# Patient Record
Sex: Male | Born: 1950 | Race: White | Hispanic: No | Marital: Single | Smoking: Never smoker
Health system: Southern US, Community
[De-identification: ages and names within clinical notes are randomized; demographics above are authoritative.]

## PROBLEM LIST (undated history)

## (undated) DIAGNOSIS — G459 Transient cerebral ischemic attack, unspecified: Secondary | ICD-10-CM

## (undated) DIAGNOSIS — E039 Hypothyroidism, unspecified: Secondary | ICD-10-CM

## (undated) DIAGNOSIS — E785 Hyperlipidemia, unspecified: Secondary | ICD-10-CM

## (undated) DIAGNOSIS — Z8709 Personal history of other diseases of the respiratory system: Secondary | ICD-10-CM

## (undated) DIAGNOSIS — K219 Gastro-esophageal reflux disease without esophagitis: Secondary | ICD-10-CM

## (undated) DIAGNOSIS — C61 Malignant neoplasm of prostate: Secondary | ICD-10-CM

## (undated) DIAGNOSIS — I739 Peripheral vascular disease, unspecified: Secondary | ICD-10-CM

## (undated) DIAGNOSIS — M549 Dorsalgia, unspecified: Secondary | ICD-10-CM

## (undated) DIAGNOSIS — F32A Depression, unspecified: Secondary | ICD-10-CM

## (undated) DIAGNOSIS — R351 Nocturia: Secondary | ICD-10-CM

## (undated) DIAGNOSIS — T7840XA Allergy, unspecified, initial encounter: Secondary | ICD-10-CM

## (undated) DIAGNOSIS — F329 Major depressive disorder, single episode, unspecified: Secondary | ICD-10-CM

## (undated) DIAGNOSIS — I10 Essential (primary) hypertension: Secondary | ICD-10-CM

## (undated) DIAGNOSIS — I639 Cerebral infarction, unspecified: Secondary | ICD-10-CM

## (undated) DIAGNOSIS — G4700 Insomnia, unspecified: Secondary | ICD-10-CM

## (undated) DIAGNOSIS — Z860100 Personal history of colon polyps, unspecified: Secondary | ICD-10-CM

## (undated) DIAGNOSIS — Z87442 Personal history of urinary calculi: Secondary | ICD-10-CM

## (undated) DIAGNOSIS — M1990 Unspecified osteoarthritis, unspecified site: Secondary | ICD-10-CM

## (undated) DIAGNOSIS — M2550 Pain in unspecified joint: Secondary | ICD-10-CM

## (undated) DIAGNOSIS — G8929 Other chronic pain: Secondary | ICD-10-CM

## (undated) DIAGNOSIS — J189 Pneumonia, unspecified organism: Secondary | ICD-10-CM

## (undated) HISTORY — DX: Malignant neoplasm of prostate: C61

## (undated) HISTORY — DX: Major depressive disorder, single episode, unspecified: F32.9

## (undated) HISTORY — DX: Hypothyroidism, unspecified: E03.9

## (undated) HISTORY — DX: Personal history of colonic polyps: Z86.010

## (undated) HISTORY — DX: Essential (primary) hypertension: I10

## (undated) HISTORY — DX: Depression, unspecified: F32.A

## (undated) HISTORY — PX: POLYPECTOMY: SHX149

## (undated) HISTORY — PX: TIBIA FRACTURE SURGERY: SHX806

## (undated) HISTORY — PX: HERNIA REPAIR: SHX51

## (undated) HISTORY — DX: Cerebral infarction, unspecified: I63.9

## (undated) HISTORY — DX: Insomnia, unspecified: G47.00

## (undated) HISTORY — PX: KNEE SURGERY: SHX244

## (undated) HISTORY — PX: ELBOW SURGERY: SHX618

## (undated) HISTORY — PX: PROSTATECTOMY: SHX69

## (undated) HISTORY — PX: COLONOSCOPY: SHX174

## (undated) HISTORY — PX: TONSILLECTOMY: SUR1361

## (undated) HISTORY — DX: Personal history of colon polyps, unspecified: Z86.0100

## (undated) HISTORY — DX: Unspecified osteoarthritis, unspecified site: M19.90

## (undated) HISTORY — DX: Allergy, unspecified, initial encounter: T78.40XA

---

## 1997-07-16 ENCOUNTER — Ambulatory Visit (HOSPITAL_COMMUNITY): Admission: RE | Admit: 1997-07-16 | Discharge: 1997-07-16 | Payer: Self-pay | Admitting: Internal Medicine

## 2003-05-01 ENCOUNTER — Encounter: Admission: RE | Admit: 2003-05-01 | Discharge: 2003-05-01 | Payer: Self-pay | Admitting: Psychiatry

## 2004-01-26 ENCOUNTER — Ambulatory Visit: Payer: Self-pay | Admitting: Internal Medicine

## 2004-03-17 ENCOUNTER — Ambulatory Visit: Payer: Self-pay | Admitting: Internal Medicine

## 2004-05-11 ENCOUNTER — Ambulatory Visit: Payer: Self-pay | Admitting: Internal Medicine

## 2004-08-02 ENCOUNTER — Ambulatory Visit: Payer: Self-pay | Admitting: Internal Medicine

## 2004-09-12 ENCOUNTER — Ambulatory Visit: Payer: Self-pay | Admitting: Internal Medicine

## 2004-10-05 ENCOUNTER — Ambulatory Visit: Payer: Self-pay | Admitting: Family Medicine

## 2004-11-22 ENCOUNTER — Ambulatory Visit: Payer: Self-pay | Admitting: Internal Medicine

## 2004-12-13 ENCOUNTER — Ambulatory Visit: Payer: Self-pay | Admitting: Internal Medicine

## 2004-12-19 ENCOUNTER — Ambulatory Visit: Payer: Self-pay | Admitting: Internal Medicine

## 2005-02-17 ENCOUNTER — Emergency Department (HOSPITAL_COMMUNITY): Admission: EM | Admit: 2005-02-17 | Discharge: 2005-02-17 | Payer: Self-pay | Admitting: Emergency Medicine

## 2005-03-02 ENCOUNTER — Ambulatory Visit: Payer: Self-pay | Admitting: Internal Medicine

## 2005-03-08 ENCOUNTER — Encounter: Payer: Self-pay | Admitting: Internal Medicine

## 2005-03-08 ENCOUNTER — Ambulatory Visit: Payer: Self-pay

## 2005-03-21 ENCOUNTER — Ambulatory Visit: Payer: Self-pay | Admitting: Internal Medicine

## 2005-04-04 ENCOUNTER — Ambulatory Visit: Payer: Self-pay | Admitting: Internal Medicine

## 2005-06-22 ENCOUNTER — Ambulatory Visit: Payer: Self-pay | Admitting: Internal Medicine

## 2005-07-13 ENCOUNTER — Ambulatory Visit: Payer: Self-pay | Admitting: Internal Medicine

## 2005-09-29 ENCOUNTER — Ambulatory Visit: Payer: Self-pay | Admitting: Internal Medicine

## 2006-07-03 ENCOUNTER — Ambulatory Visit: Payer: Self-pay | Admitting: Internal Medicine

## 2006-07-03 LAB — CONVERTED CEMR LAB
AST: 22 units/L (ref 0–37)
Albumin: 4.2 g/dL (ref 3.5–5.2)
Basophils Absolute: 0 10*3/uL (ref 0.0–0.1)
Bilirubin, Direct: 0.1 mg/dL (ref 0.0–0.3)
Calcium: 9.4 mg/dL (ref 8.4–10.5)
Chloride: 108 meq/L (ref 96–112)
Cholesterol: 212 mg/dL (ref 0–200)
Eosinophils Absolute: 0.2 10*3/uL (ref 0.0–0.6)
Eosinophils Relative: 3.3 % (ref 0.0–5.0)
GFR calc Af Amer: 99 mL/min
GFR calc non Af Amer: 82 mL/min
Glucose, Bld: 98 mg/dL (ref 70–99)
HDL: 34.4 mg/dL — ABNORMAL LOW (ref >39.0)
Lymphocytes Relative: 23.1 % (ref 12.0–46.0)
MCHC: 34.8 g/dL (ref 30.0–36.0)
MCV: 90.1 fL (ref 78.0–100.0)
Neutro Abs: 4.3 10*3/uL (ref 1.4–7.7)
Neutrophils Relative %: 62.5 % (ref 43.0–77.0)
PSA: 0.01 ng/mL — ABNORMAL LOW (ref 0.10–4.00)
Platelets: 262 10*3/uL (ref 150–400)
RBC: 4.97 M/uL (ref 4.22–5.81)
Sodium: 143 meq/L (ref 135–145)
TSH: 6.26 microintl units/mL — ABNORMAL HIGH (ref 0.35–5.50)
Triglycerides: 165 mg/dL — ABNORMAL HIGH (ref 0–149)
WBC: 6.9 10*3/uL (ref 4.5–10.5)

## 2006-07-10 ENCOUNTER — Ambulatory Visit: Payer: Self-pay | Admitting: Internal Medicine

## 2006-07-22 ENCOUNTER — Emergency Department (HOSPITAL_COMMUNITY): Admission: EM | Admit: 2006-07-22 | Discharge: 2006-07-22 | Payer: Self-pay | Admitting: Emergency Medicine

## 2006-08-01 ENCOUNTER — Ambulatory Visit: Payer: Self-pay | Admitting: Internal Medicine

## 2006-10-11 ENCOUNTER — Encounter: Payer: Self-pay | Admitting: Internal Medicine

## 2006-10-11 DIAGNOSIS — M1990 Unspecified osteoarthritis, unspecified site: Secondary | ICD-10-CM | POA: Insufficient documentation

## 2006-10-11 DIAGNOSIS — F3289 Other specified depressive episodes: Secondary | ICD-10-CM | POA: Insufficient documentation

## 2006-10-11 DIAGNOSIS — J309 Allergic rhinitis, unspecified: Secondary | ICD-10-CM | POA: Insufficient documentation

## 2006-10-11 DIAGNOSIS — Z8546 Personal history of malignant neoplasm of prostate: Secondary | ICD-10-CM

## 2006-10-11 DIAGNOSIS — F329 Major depressive disorder, single episode, unspecified: Secondary | ICD-10-CM

## 2006-10-15 ENCOUNTER — Ambulatory Visit: Payer: Self-pay | Admitting: Internal Medicine

## 2006-10-15 DIAGNOSIS — I10 Essential (primary) hypertension: Secondary | ICD-10-CM

## 2007-01-28 ENCOUNTER — Encounter: Payer: Self-pay | Admitting: Internal Medicine

## 2007-04-30 ENCOUNTER — Ambulatory Visit: Payer: Self-pay | Admitting: Internal Medicine

## 2007-06-27 ENCOUNTER — Telehealth: Payer: Self-pay | Admitting: Internal Medicine

## 2007-07-12 ENCOUNTER — Ambulatory Visit: Payer: Self-pay | Admitting: Internal Medicine

## 2007-07-12 LAB — CONVERTED CEMR LAB
ALT: 27 units/L (ref 0–53)
Basophils Absolute: 0.1 10*3/uL (ref 0.0–0.1)
Basophils Relative: 0.6 % (ref 0.0–1.0)
Bilirubin Urine: NEGATIVE
CO2: 30 meq/L (ref 19–32)
Calcium: 9.4 mg/dL (ref 8.4–10.5)
Cholesterol: 209 mg/dL (ref 0–200)
Creatinine, Ser: 1.1 mg/dL (ref 0.4–1.5)
Direct LDL: 145.7 mg/dL
Eosinophils Absolute: 0.1 10*3/uL (ref 0.0–0.7)
GFR calc Af Amer: 88 mL/min
Glucose, Urine, Semiquant: NEGATIVE
HCT: 40.7 % (ref 39.0–52.0)
Hemoglobin: 14.1 g/dL (ref 13.0–17.0)
Lymphocytes Relative: 20.7 % (ref 12.0–46.0)
MCHC: 34.7 g/dL (ref 30.0–36.0)
MCV: 89.7 fL (ref 78.0–100.0)
Monocytes Absolute: 0.7 10*3/uL (ref 0.1–1.0)
Neutro Abs: 5.8 10*3/uL (ref 1.4–7.7)
PSA: 0 ng/mL — ABNORMAL LOW (ref 0.10–4.00)
RBC: 4.53 M/uL (ref 4.22–5.81)
RDW: 14.6 % (ref 11.5–14.6)
Specific Gravity, Urine: 1.025
TSH: 4.75 microintl units/mL (ref 0.35–5.50)
Total Bilirubin: 1 mg/dL (ref 0.3–1.2)
WBC Urine, dipstick: NEGATIVE
pH: 7

## 2007-07-22 ENCOUNTER — Ambulatory Visit: Payer: Self-pay | Admitting: Internal Medicine

## 2007-08-21 ENCOUNTER — Ambulatory Visit: Payer: Self-pay | Admitting: Internal Medicine

## 2007-08-21 DIAGNOSIS — J069 Acute upper respiratory infection, unspecified: Secondary | ICD-10-CM | POA: Insufficient documentation

## 2008-01-22 ENCOUNTER — Ambulatory Visit: Payer: Self-pay | Admitting: Internal Medicine

## 2008-05-04 ENCOUNTER — Telehealth: Payer: Self-pay | Admitting: Internal Medicine

## 2008-07-17 ENCOUNTER — Ambulatory Visit: Payer: Self-pay | Admitting: Internal Medicine

## 2008-07-17 LAB — CONVERTED CEMR LAB
ALT: 25 units/L (ref 0–53)
Basophils Relative: 0.4 % (ref 0.0–3.0)
Bilirubin Urine: NEGATIVE
Bilirubin, Direct: 0 mg/dL (ref 0.0–0.3)
Chloride: 110 meq/L (ref 96–112)
Eosinophils Relative: 2.8 % (ref 0.0–5.0)
HCT: 40.2 % (ref 39.0–52.0)
Hemoglobin: 13.7 g/dL (ref 13.0–17.0)
LDL Cholesterol: 125 mg/dL — ABNORMAL HIGH (ref 0–99)
Lymphs Abs: 1.2 10*3/uL (ref 0.7–4.0)
MCV: 89.2 fL (ref 78.0–100.0)
Monocytes Absolute: 0.5 10*3/uL (ref 0.1–1.0)
Nitrite: NEGATIVE
PSA: 0.01 ng/mL — ABNORMAL LOW (ref 0.10–4.00)
Potassium: 3.8 meq/L (ref 3.5–5.1)
RBC: 4.51 M/uL (ref 4.22–5.81)
TSH: 3.56 microintl units/mL (ref 0.35–5.50)
Total CHOL/HDL Ratio: 6
Total Protein: 6.9 g/dL (ref 6.0–8.3)
Urobilinogen, UA: 0.2
WBC: 4.9 10*3/uL (ref 4.5–10.5)

## 2008-07-28 ENCOUNTER — Ambulatory Visit: Payer: Self-pay | Admitting: Internal Medicine

## 2008-08-17 ENCOUNTER — Ambulatory Visit: Payer: Self-pay | Admitting: Gastroenterology

## 2008-08-31 ENCOUNTER — Encounter: Payer: Self-pay | Admitting: Gastroenterology

## 2008-08-31 ENCOUNTER — Ambulatory Visit: Payer: Self-pay | Admitting: Gastroenterology

## 2008-08-31 LAB — HM COLONOSCOPY

## 2008-09-01 ENCOUNTER — Encounter: Payer: Self-pay | Admitting: Gastroenterology

## 2009-07-30 ENCOUNTER — Ambulatory Visit: Payer: Self-pay | Admitting: Internal Medicine

## 2009-07-30 LAB — CONVERTED CEMR LAB
AST: 25 units/L (ref 0–37)
Albumin: 4.3 g/dL (ref 3.5–5.2)
Alkaline Phosphatase: 63 units/L (ref 39–117)
Basophils Relative: 0.7 % (ref 0.0–3.0)
Bilirubin, Direct: 0.2 mg/dL (ref 0.0–0.3)
CO2: 26 meq/L (ref 19–32)
Calcium: 9.1 mg/dL (ref 8.4–10.5)
Eosinophils Relative: 2.5 % (ref 0.0–5.0)
GFR calc non Af Amer: 77.29 mL/min (ref >60)
HDL: 36.4 mg/dL — ABNORMAL LOW (ref >39.00)
Hemoglobin: 14.3 g/dL (ref 13.0–17.0)
LDL Cholesterol: 115 mg/dL — ABNORMAL HIGH (ref 0–99)
Lymphocytes Relative: 19.3 % (ref 12.0–46.0)
MCHC: 34.7 g/dL (ref 30.0–36.0)
Monocytes Relative: 8.2 % (ref 3.0–12.0)
Neutro Abs: 4.9 10*3/uL (ref 1.4–7.7)
Nitrite: NEGATIVE
RBC: 4.4 M/uL (ref 4.22–5.81)
Sodium: 144 meq/L (ref 135–145)
Specific Gravity, Urine: 1.025
Total CHOL/HDL Ratio: 5
Total Protein: 6.6 g/dL (ref 6.0–8.3)
WBC Urine, dipstick: NEGATIVE
WBC: 7.1 10*3/uL (ref 4.5–10.5)

## 2009-08-06 ENCOUNTER — Ambulatory Visit: Payer: Self-pay | Admitting: Internal Medicine

## 2009-08-06 DIAGNOSIS — E039 Hypothyroidism, unspecified: Secondary | ICD-10-CM | POA: Insufficient documentation

## 2009-08-06 DIAGNOSIS — Z860100 Personal history of colon polyps, unspecified: Secondary | ICD-10-CM | POA: Insufficient documentation

## 2009-08-06 DIAGNOSIS — F82 Specific developmental disorder of motor function: Secondary | ICD-10-CM | POA: Insufficient documentation

## 2009-08-10 ENCOUNTER — Telehealth: Payer: Self-pay | Admitting: Internal Medicine

## 2009-08-17 ENCOUNTER — Encounter: Admission: RE | Admit: 2009-08-17 | Discharge: 2009-08-17 | Payer: Self-pay | Admitting: Internal Medicine

## 2009-08-18 ENCOUNTER — Telehealth: Payer: Self-pay | Admitting: Internal Medicine

## 2009-09-03 ENCOUNTER — Ambulatory Visit: Payer: Self-pay | Admitting: Internal Medicine

## 2009-09-23 ENCOUNTER — Telehealth: Payer: Self-pay | Admitting: Internal Medicine

## 2009-10-05 ENCOUNTER — Ambulatory Visit: Payer: Self-pay | Admitting: Internal Medicine

## 2010-03-06 DIAGNOSIS — J189 Pneumonia, unspecified organism: Secondary | ICD-10-CM

## 2010-03-06 HISTORY — DX: Pneumonia, unspecified organism: J18.9

## 2010-04-05 ENCOUNTER — Ambulatory Visit
Admission: RE | Admit: 2010-04-05 | Discharge: 2010-04-05 | Payer: Self-pay | Source: Home / Self Care | Attending: Internal Medicine | Admitting: Internal Medicine

## 2010-04-05 NOTE — Assessment & Plan Note (Signed)
Summary: 1 month rov/njr   Vital Signs:  Patient profile:   60 year old male Weight:      235 pounds Temp:     98.6 degrees F oral BP sitting:   126 / 78  (right arm) Cuff size:   regular  Vitals Entered By: Duard Brady LPN (September 03, 1608 9:12 AM) CC: 1 mos rov - doing well Is Patient Diabetic? No   CC:  1 mos rov - doing well.  History of Present Illness: 60 -year-old patient who is seen today for follow-up of his hypertension.  He was resumed on combination therapy one month ago.  At that time, he presented with a history of some right hand dyspraxia, which has improved.  An MRI was consistent with a left caudate lacunar stroke.  He has been compliant with his medications.  Preventive Screening-Counseling & Management  Alcohol-Tobacco     Smoking Status: quit  Allergies (verified): No Known Drug Allergies  Past History:  Past Medical History: Allergic rhinitis Prostate cancer, hx of Depression Osteoarthritis Insomnia ED history of hypertension Colonic polyps, hx of Hypothyroidism left caudate lacunar stroke March 2011  Past Surgical History: Reviewed history from 08/06/2009 and no changes required. Prostatectomy 1998 Tonsillectomy Knee surgery L Elbow surgery Inguinal herniorrhaphy fracture right lower leg  colonoscopy or sigmoidoscopy in 1998 colonoscopy june 2010  Social History: Smoking Status:  quit  Review of Systems       The patient complains of difficulty walking.  The patient denies anorexia, fever, weight loss, weight gain, vision loss, decreased hearing, hoarseness, chest pain, syncope, dyspnea on exertion, peripheral edema, prolonged cough, headaches, hemoptysis, abdominal pain, melena, hematochezia, severe indigestion/heartburn, hematuria, incontinence, genital sores, muscle weakness, suspicious skin lesions, transient blindness, depression, unusual weight change, abnormal bleeding, enlarged lymph nodes, angioedema, breast masses, and  testicular masses.    Physical Exam  General:  Well-developed,well-nourished,in no acute distress; alert,appropriate and cooperative throughout examination Head:  Normocephalic and atraumatic without obvious abnormalities. No apparent alopecia or balding. Mouth:  Oral mucosa and oropharynx without lesions or exudates.  Teeth in good repair. Neck:  No deformities, masses, or tenderness noted. Lungs:  Normal respiratory effort, chest expands symmetrically. Lungs are clear to auscultation, no crackles or wheezes. Heart:  Normal rate and regular rhythm. S1 and S2 normal without gallop, murmur, click, rub or other extra sounds. Abdomen:  Bowel sounds positive,abdomen soft and non-tender without masses, organomegaly or hernias noted.   Impression & Recommendations:  Problem # 1:  DYSPRAXIA (ICD-315.4)  Problem # 2:  HYPOTHYROIDISM (ICD-244.9)  His updated medication list for this problem includes:    Levothroid 50 Mcg Tabs (Levothyroxine sodium) ..... One daily  His updated medication list for this problem includes:    Levothroid 50 Mcg Tabs (Levothyroxine sodium) ..... One daily  Problem # 3:  HYPERTENSION, BENIGN ESSENTIAL (ICD-401.1)  His updated medication list for this problem includes:    Lisinopril-hydrochlorothiazide 20-12.5 Mg Tabs (Lisinopril-hydrochlorothiazide) ..... One daily  His updated medication list for this problem includes:    Lisinopril-hydrochlorothiazide 20-12.5 Mg Tabs (Lisinopril-hydrochlorothiazide) ..... One daily  Complete Medication List: 1)  Tramadol Hcl 50 Mg Tabs (Tramadol hcl) .Marland Kitchen.. 1 q6h as needed 2)  Piroxicam 20 Mg Caps (Piroxicam) .... As needed 3)  Lisinopril-hydrochlorothiazide 20-12.5 Mg Tabs (Lisinopril-hydrochlorothiazide) .... One daily 4)  Nasonex 50 Mcg/act Susp (Mometasone furoate) .... Used daily 5)  Aspir-trin 325 Mg Tbec (Aspirin) .... Take one daily 6)  Levothroid 50 Mcg Tabs (Levothyroxine sodium) .... One  daily 7)  Lipitor 20 Mg  Tabs (Atorvastatin calcium) .... One by mouth daily.  Patient Instructions: 1)  Please schedule a follow-up appointment in 4 months. 2)  Limit your Sodium (Salt). 3)  It is important that you exercise regularly at least 20 minutes 5 times a week. If you develop chest pain, have severe difficulty breathing, or feel very tired , stop exercising immediately and seek medical attention. 4)  Check your Blood Pressure regularly. If it is above: 150/90 you should make an appointment.

## 2010-04-05 NOTE — Assessment & Plan Note (Signed)
Summary: fu on med/njr/pt rescd//ccm   Vital Signs:  Patient profile:   60 year old male Weight:      231 pounds Temp:     98.7 degrees F oral BP sitting:   100 / 70  (right arm) Cuff size:   regular  Vitals Entered By: Duard Brady LPN (October 05, 2009 9:53 AM) CC: f/u on meds Is Patient Diabetic? No   CC:  f/u on meds.  History of Present Illness:  60 year old patient who is seen today for follow up.  he has a long history of depression and self discontinued Paxil  earlier in the spring.  More recently, he has had worsening depression and feels that he needs to resume this medication.  No real stressors or aggravating factors.  He does have treated hypertension, which has been stable.  No suicidal ideation  Allergies (verified): No Known Drug Allergies  Past History:  Past Medical History: Reviewed history from 09/03/2009 and no changes required. Allergic rhinitis Prostate cancer, hx of Depression Osteoarthritis Insomnia ED history of hypertension Colonic polyps, hx of Hypothyroidism left caudate lacunar stroke March 2011  Review of Systems       The patient complains of difficulty walking and depression.  The patient denies anorexia, fever, weight loss, weight gain, vision loss, decreased hearing, hoarseness, chest pain, syncope, dyspnea on exertion, peripheral edema, prolonged cough, headaches, hemoptysis, abdominal pain, melena, hematochezia, severe indigestion/heartburn, hematuria, incontinence, genital sores, muscle weakness, suspicious skin lesions, transient blindness, unusual weight change, abnormal bleeding, enlarged lymph nodes, angioedema, breast masses, and testicular masses.    Physical Exam  General:  overweight-appearing.  blood pressure low normal rangeoverweight-appearing.   Psych:  dysphoric affect.  dysphoric affect.     Impression & Recommendations:  Problem # 1:  DEPRESSION (ICD-311)  His updated medication list for this problem  includes:    Paroxetine Hcl 20 Mg Tabs (Paroxetine hcl) ..... One every aam  His updated medication list for this problem includes:    Paroxetine Hcl 20 Mg Tabs (Paroxetine hcl) ..... One every aam  Problem # 2:  HYPERTENSION, BENIGN ESSENTIAL (ICD-401.1)  His updated medication list for this problem includes:    Lisinopril-hydrochlorothiazide 20-12.5 Mg Tabs (Lisinopril-hydrochlorothiazide) ..... One daily  His updated medication list for this problem includes:    Lisinopril-hydrochlorothiazide 20-12.5 Mg Tabs (Lisinopril-hydrochlorothiazide) ..... One daily  Complete Medication List: 1)  Tramadol Hcl 50 Mg Tabs (Tramadol hcl) .Marland Kitchen.. 1 q6h as needed 2)  Piroxicam 20 Mg Caps (Piroxicam) .... As needed 3)  Lisinopril-hydrochlorothiazide 20-12.5 Mg Tabs (Lisinopril-hydrochlorothiazide) .... One daily 4)  Nasonex 50 Mcg/act Susp (Mometasone furoate) .... Used daily 5)  Aspir-trin 325 Mg Tbec (Aspirin) .... Take one daily 6)  Levothroid 50 Mcg Tabs (Levothyroxine sodium) .... One daily 7)  Lipitor 20 Mg Tabs (Atorvastatin calcium) .... One by mouth daily. 8)  Paroxetine Hcl 20 Mg Tabs (Paroxetine hcl) .... One every aam  Patient Instructions: 1)  Please schedule a follow-up appointment in 6 months. 2)  Limit your Sodium (Salt) to less than 2 grams a day(slightly less than 1/2 a teaspoon) to prevent fluid retention, swelling, or worsening of symptoms. 3)  It is important that you exercise regularly at least 20 minutes 5 times a week. If you develop chest pain, have severe difficulty breathing, or feel very tired , stop exercising immediately and seek medical attention. 4)  You need to lose weight. Consider a lower calorie diet and regular exercise.  Prescriptions: PAROXETINE HCL 20 MG  TABS (PAROXETINE HCL) one every aam  #90 x 4   Entered and Authorized by:   Gordy Savers  MD   Signed by:   Gordy Savers  MD on 10/05/2009   Method used:   Print then Give to Patient   RxID:    [id number] PAROXETINE HCL 20 MG TABS (PAROXETINE HCL) one every aam  #90 x 4   Entered and Authorized by:   Gordy Savers  MD   Signed by:   Gordy Savers  MD on 10/05/2009   Method used:   Electronically to        [facility]* (retail)       [address] [place], [place]  [address]       Ph: [phone] or [phone]       Fax: [phone]   RxID:   [id number]

## 2010-04-05 NOTE — Progress Notes (Signed)
Summary: MRI  Phone Note Call from Patient Call back at Home Phone [phone]   Summary of Call: MRI showed tiny stroke.  Noticed disconnect between brain & hand when writing & leg right sometimes doesn't respond.  Takes one 325mg  asa daily.  Does he need to do anything else or come & see you?  Initial call taken by: Rudy Jew, RN,  August 18, 2009 3:47 PM  Follow-up for Phone Call        start   Lipitor, 20 mg daily; needs office visit in 3 months for follow-up.  Laboratory studies.  ask  patient to make office visit if there is any change in his status Follow-up by: Gordy Savers  MD,  August 19, 2009 8:00 AM  Additional Follow-up for Phone Call Additional follow up Details #1::        LMTCB Additional Follow-up by: Lynann Beaver CMA,  August 19, 2009 8:56 AM    New/Updated Medications: LIPITOR 20 MG TABS (ATORVASTATIN CALCIUM) one by mouth daily. Prescriptions: LIPITOR 20 MG TABS (ATORVASTATIN CALCIUM) one by mouth daily.  #90 x 3   Entered by:   Lynann Beaver CMA   Authorized by:   Gordy Savers  MD   Signed by:   Lynann Beaver CMA on 08/19/2009   Method used:   Electronically to        [facility]* (retail)       [address] [place], [place]  [address]       Ph: [phone] or [phone]       Fax: [phone]   RxID:   [id number]  Pt notified.

## 2010-04-05 NOTE — Progress Notes (Signed)
Summary: valium for MRI  Phone Note Call from Patient Call back at Home Phone [phone]   Summary of Call: Needs valium for MRI Thurs.   [facility] suggested.  [facility]  NKDA. Initial call taken by: Rudy Jew, RN,  August 10, 2009 3:58 PM  Follow-up for Phone Call        diazepam 53m  #3 Follow-up by: Gordy Savers  MD,  August 10, 2009 5:13 PM  Additional Follow-up for Phone Call Additional follow up Details #1::        Called to [facility]. Additional Follow-up by: Lynann Beaver CMA,  August 11, 2009 10:35 AM

## 2010-04-05 NOTE — Progress Notes (Signed)
Summary: parxotine - denied  Phone Note Refill Request Message from:  Fax from Pharmacy on September 23, 2009 1:17 PM  requesting refill on paroxetine hcl 20mg  - [facility] [phone]   Method Requested: Fax to Local Pharmacy Initial call taken by: Duard Brady LPN,  September 23, 2009 1:18 PM  Follow-up for Phone Call        medication was removed from med list 08/06/09 ov - was seen 7/1 - was not placed back on med list - not in visit notes.  denied. faxed bck to pharm. KIK Follow-up by: Duard Brady LPN,  September 23, 2009 1:19 PM

## 2010-04-05 NOTE — Assessment & Plan Note (Signed)
Summary: cpx//ccm   Vital Signs:  Patient profile:   60 year old male Height:      69 inches Weight:      239 pounds BMI:     35.42 Temp:     98.5 degrees F oral BP sitting:   130 / 88  (left arm) Cuff size:   regular  Vitals Entered By: Duard Brady LPN (August 06, 452 2:57 PM) CC: cpx - doing well , labs done Is Patient Diabetic? No   CC:  cpx - doing well  and labs done.  History of Present Illness: 60 year old patient who is in today for an annual examination.  His long history of hypertension, but has been off his antihypertensive medications for approximately 1 month.  He has advanced arthritis.  History depression, which has been stable off his medication has remote history of prostate cancer, and allergic rhinitis. For the past, one or two months.  He has had some difficulty with the use of his right hand.  He states that he is had a difficult time writing  legibly.  He also describes some stiffness  and a  sense of his right leg being unsteady.  For the past 3 months.  He describes occasionally neck pain.  Denies any bowel or bladder difficulty  Preventive Screening-Counseling & Management  Alcohol-Tobacco     Smoking Status: never  Allergies (verified): No Known Drug Allergies  Past History:  Past Medical History: Allergic rhinitis Prostate cancer, hx of Depression Osteoarthritis Insomnia ED history of hypertension Colonic polyps, hx of Hypothyroidism  Past Surgical History: Prostatectomy 1998 Tonsillectomy Knee surgery L Elbow surgery Inguinal herniorrhaphy fracture right lower leg  colonoscopy or sigmoidoscopy in 1998 colonoscopy june 2010  Family History: Reviewed history from 07/28/2008 and no changes required. father died age 66, MI mother died at 50 from a motor vehicle accident had a history of asthma two brothers 3 sisters-  positive for coronary artery disease  (2)  One sister died of MI (77)  Social History: Reviewed history  from 04/30/2007 and no changes required. 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  The patient denies anorexia, fever, weight loss, weight gain, vision loss, decreased hearing, hoarseness, chest pain, syncope, dyspnea on exertion, peripheral edema, prolonged cough, headaches, hemoptysis, abdominal pain, melena, hematochezia, severe indigestion/heartburn, hematuria, incontinence, genital sores, muscle weakness, suspicious skin lesions, transient blindness, unusual weight change, abnormal bleeding, enlarged lymph nodes, angioedema, breast masses, and testicular masses.    Physical Exam  General:  overweight-appearing.  160/92overweight-appearing.   Head:  Normocephalic and atraumatic without obvious abnormalities. No apparent alopecia or balding. Eyes:  No corneal or conjunctival inflammation noted. EOMI. Perrla. Funduscopic exam benign, without hemorrhages, exudates or papilledema. Vision grossly normal. Ears:  External ear exam shows no significant lesions or deformities.  Otoscopic examination reveals clear canals, tympanic membranes are intact bilaterally without bulging, retraction, inflammation or discharge. Hearing is grossly normal bilaterally. Nose:  External nasal examination shows no deformity or inflammation. Nasal mucosa are pink and moist without lesions or exudates. Mouth:  Oral mucosa and oropharynx without lesions or exudates.  Teeth in good repair. Neck:  No deformities, masses, or tenderness noted. Chest Wall:  No deformities, masses, tenderness or gynecomastia noted. Breasts:  No masses or gynecomastia noted Lungs:  Normal respiratory effort, chest expands symmetrically. Lungs are clear to auscultation, no crackles or wheezes. Heart:  Normal rate and regular rhythm. S1 and S2 normal without gallop, murmur, click, rub or other extra sounds. Abdomen:  Bowel sounds positive,abdomen  soft and non-tender without masses, organomegaly or hernias noted. Rectal:  No external abnormalities noted. Normal sphincter tone. No rectal masses or tenderness. Genitalia:  Testes bilaterally descended without nodularity, tenderness or masses. No scrotal masses or lesions. No penis lesions or urethral discharge. Prostate:  prostate surgically absent Msk:  No deformity or scoliosis noted of thoracic or lumbar spine.   Pulses:  R and L carotid,radial,femoral,dorsalis pedis and posterior tibial pulses are full and equal bilaterally Extremities:  No clubbing, cyanosis, edema, or deformity noted with normal full range of motion of all joints.   Neurologic:  alert & oriented X3, cranial nerves II-XII intact, and strength normal in all extremities.   vibratory  sensation, and soft touch decreased in the extremities on the right side reflexes slightly hyporeflexive, but symmetrical finger-to-nose, and heel-to-shin testing were normal no drift  Skin:  Intact without suspicious lesions or rashes Cervical Nodes:  No lymphadenopathy noted Axillary Nodes:  No palpable lymphadenopathy Inguinal Nodes:  No significant adenopathy Psych:  Cognition and judgment appear intact. Alert and cooperative with normal attention span and concentration. No apparent delusions, illusions, hallucinations   Complete Medication List: 1)  Tramadol Hcl 50 Mg Tabs (Tramadol hcl) .Marland Kitchen.. 1 q6h as needed 2)  Piroxicam 20 Mg Caps (Piroxicam) .... As needed 3)  Lisinopril-hydrochlorothiazide 20-12.5 Mg Tabs (Lisinopril-hydrochlorothiazide) .... One daily 4)  Nasonex 50 Mcg/act Susp (Mometasone furoate) .... Used daily 5)  Aspir-trin 325 Mg Tbec (Aspirin) .... Take one daily 6)  Levothroid 50 Mcg Tabs (Levothyroxine sodium) .... One daily  Other Orders: EKG w/ Interpretation (93000) Radiology Referral (Radiology)  Patient Instructions: 1)  brain MRI as discussed 2)  Limit your Sodium (Salt) to less than 2 grams a  day(slightly less than 1/2 a teaspoon) to prevent fluid retention, swelling, or worsening of symptoms. 3)  It is important that you exercise regularly at least 20 minutes 5 times a week. If you develop chest pain, have severe difficulty breathing, or feel very tired , stop exercising immediately and seek medical attention. 4)  You need to lose weight. Consider a lower calorie diet and regular exercise.  5)  Please schedule a follow-up appointment in 1 month. On the way to ENT if her with a [place] he showsPrescriptions: LEVOTHROID 50 MCG TABS (LEVOTHYROXINE SODIUM) one daily  #90 x 6   Entered and Authorized by:   Gordy Savers  MD   Signed by:   Gordy Savers  MD on 08/06/2009   Method used:   Electronically to        [facility]* (retail)       [address] [place], [place]  [address]       Ph: [phone] or [phone]       Fax: [phone]   RxID:   [id number] NASONEX 50 MCG/ACT SUSP (MOMETASONE FUROATE) used daily  #3 x 6   Entered and Authorized by:   Gordy Savers  MD   Signed by:   Gordy Savers  MD on 08/06/2009   Method used:   Electronically to        [facility]* (retail)       [address] [place], [place]  [address]       Ph: [phone] or [phone]       Fax: [phone]   RxID:   [id number] LISINOPRIL-HYDROCHLOROTHIAZIDE 20-12.5 MG TABS (LISINOPRIL-HYDROCHLOROTHIAZIDE) one daily  #90 x 6   Entered and Authorized by:   Gordy Savers  MD   Signed by:   Gordy Savers  MD on 08/06/2009   Method used:   Electronically to        [facility]* (retail)       [address] [place], [place]  [address]       Ph: [phone] or [phone]       Fax: [phone]   RxID:   [id number] PIROXICAM 20 MG  CAPS (PIROXICAM) as needed  #90 x  6   Entered and Authorized by:   Gordy Savers  MD   Signed by:   Gordy Savers  MD on 08/06/2009   Method used:   Electronically to        [facility]* (retail)       [address] [place], [place]  [address]       Ph: [phone] or [phone]       Fax: [phone]   RxID:   [id number] TRAMADOL HCL 50 MG  TABS (TRAMADOL HCL) 1 q6h as needed  #90 x 6   Entered and Authorized by:   Gordy Savers  MD   Signed by:   Gordy Savers  MD on 08/06/2009   Method used:   Electronically to        [facility]* (retail)       [address] [place], [place]  [address]       Ph: [phone] or [phone]       Fax: [phone]   RxID:   [id number]   Appended Document: cpx//ccm Impression-  health maintenance exam      R sided dyspraxia  Plan-  schedule Brain MRI

## 2010-04-13 NOTE — Assessment & Plan Note (Signed)
Summary: 4 month follow up/cjr/pt rescd//ccm   Vital Signs:  Patient profile:   60 year old male Weight:      235 pounds Temp:     98.6 degrees F oral BP sitting:   110 / 82  Vitals Entered By: Duard Brady LPN (April 05, 2010 8:26 AM) CC: 4 mos rov - doing ok , c/o (R) hip pain Is Patient Diabetic? No   CC:  4 mos rov - doing ok  and c/o (R) hip pain.  History of Present Illness: 60 year old patient who has a history of osteoarthritis.  He is on maintenance of anti-inflammatory medication and also takes tramadol p.r.n. pain.  He has did hypertension.  For the past 5 days, he has had worsening right hip pain.  He has had a flare of pain in this area in the past.  His last cortisone injection was in excess of one year ago.  His blood pressure remained stable.  He does work as a Research scientist (medical).  Much time walking.  He is also having some left knee discomfort.  Preventive Screening-Counseling & Management  Alcohol-Tobacco     Smoking Status: quit     Year Quit: 20 yrs ago  Allergies (verified): No Known Drug Allergies  Past History:  Past Medical History: Reviewed history from 09/03/2009 and no changes required. Allergic rhinitis Prostate cancer, hx of Depression Osteoarthritis Insomnia ED history of hypertension Colonic polyps, hx of Hypothyroidism left caudate lacunar stroke March 2011  Past Surgical History: Reviewed history from 08/06/2009 and no changes required. Prostatectomy 1998 Tonsillectomy Knee surgery L Elbow surgery Inguinal herniorrhaphy fracture right lower leg  colonoscopy or sigmoidoscopy in 1998 colonoscopy june 2010  Review of Systems       The patient complains of difficulty walking.  The patient denies anorexia, fever, weight loss, weight gain, vision loss, decreased hearing, hoarseness, chest pain, syncope, dyspnea on exertion, peripheral edema, prolonged cough, headaches, hemoptysis, abdominal pain, melena, hematochezia,  severe indigestion/heartburn, hematuria, incontinence, genital sores, muscle weakness, suspicious skin lesions, transient blindness, depression, unusual weight change, abnormal bleeding, enlarged lymph nodes, angioedema, breast masses, and testicular masses.    Physical Exam  General:  Well-developed,well-nourished,in no acute distress; alert,appropriate and cooperative throughout examination Head:  Normocephalic and atraumatic without obvious abnormalities. No apparent alopecia or balding. Eyes:  No corneal or conjunctival inflammation noted. EOMI. Perrla. Funduscopic exam benign, without hemorrhages, exudates or papilledema. Vision grossly normal. Mouth:  Oral mucosa and oropharynx without lesions or exudates.  Teeth in good repair. Neck:  No deformities, masses, or tenderness noted. Lungs:  Normal respiratory effort, chest expands symmetrically. Lungs are clear to auscultation, no crackles or wheezes. Heart:  Normal rate and regular rhythm. S1 and S2 normal without gallop, murmur, click, rub or other extra sounds. Abdomen:  Bowel sounds positive,abdomen soft and non-tender without masses, organomegaly or hernias noted. Msk:  range of motion of the right hip was only minimally impaired, but at times, quite painful to manipulation.  There is mild point tenderness over the right lateral hip area   Pulses:  R and L carotid,radial,femoral,dorsalis pedis and posterior tibial pulses are full and equal bilaterally Extremities:  No clubbing, cyanosis, edema, or deformity noted with normal full range of motion of all joints.     Impression & Recommendations:  Problem # 1:  OSTEOARTHRITIS (ICD-715.90)  His updated medication list for this problem includes:    Tramadol Hcl 50 Mg Tabs (Tramadol hcl) .Marland Kitchen... 1 q6h as needed  Piroxicam 20 Mg Caps (Piroxicam) .Marland Kitchen... As needed    Aspir-trin 325 Mg Tbec (Aspirin) .Marland Kitchen... Take one daily    Advil 200 Mg Tabs (Ibuprofen) .Marland Kitchen... Three times a day prn I suspect  this may be more of a hip bursitis  His updated medication list for this problem includes:    Tramadol Hcl 50 Mg Tabs (Tramadol hcl) .Marland Kitchen... 1 q6h as needed    Piroxicam 20 Mg Caps (Piroxicam) .Marland Kitchen... As needed    Aspir-trin 325 Mg Tbec (Aspirin) .Marland Kitchen... Take one daily    Advil 200 Mg Tabs (Ibuprofen) .Marland Kitchen... Three times a day prn  Problem # 2:  HYPERTENSION, BENIGN ESSENTIAL (ICD-401.1)  His updated medication list for this problem includes:    Lisinopril-hydrochlorothiazide 20-12.5 Mg Tabs (Lisinopril-hydrochlorothiazide) ..... One daily  His updated medication list for this problem includes:    Lisinopril-hydrochlorothiazide 20-12.5 Mg Tabs (Lisinopril-hydrochlorothiazide) ..... One daily  Complete Medication List: 1)  Tramadol Hcl 50 Mg Tabs (Tramadol hcl) .Marland Kitchen.. 1 q6h as needed 2)  Piroxicam 20 Mg Caps (Piroxicam) .... As needed 3)  Lisinopril-hydrochlorothiazide 20-12.5 Mg Tabs (Lisinopril-hydrochlorothiazide) .... One daily 4)  Nasonex 50 Mcg/act Susp (Mometasone furoate) .... Used daily 5)  Aspir-trin 325 Mg Tbec (Aspirin) .... Take one daily 6)  Levothroid 50 Mcg Tabs (Levothyroxine sodium) .... One daily 7)  Lipitor 20 Mg Tabs (Atorvastatin calcium) .... One by mouth daily. 8)  Paroxetine Hcl 20 Mg Tabs (Paroxetine hcl) .... One every aam 9)  Advil 200 Mg Tabs (Ibuprofen) .... Three times a day prn  Patient Instructions: 1)  Please schedule a follow-up appointment in 6 months for annual exam 2)  Limit your Sodium (Salt). 3)  orthopedic follow-up as needed Prescriptions: PAROXETINE HCL 20 MG TABS (PAROXETINE HCL) one every aam  #90 x 4   Entered and Authorized by:   Gordy Savers  MD   Signed by:   Gordy Savers  MD on 04/05/2010   Method used:   Electronically to        [facility]* (retail)       [address] [place], [place]  [address]       Ph: [phone] or [phone]       Fax: [phone]    RxID:   [id number] LIPITOR 20 MG TABS (ATORVASTATIN CALCIUM) one by mouth daily.  #90 x 3   Entered and Authorized by:   Gordy Savers  MD   Signed by:   Gordy Savers  MD on 04/05/2010   Method used:   Electronically to        [facility]* (retail)       [address] [place], [place]  [address]       Ph: [phone] or [phone]       Fax: [phone]   RxID:   [id number] LEVOTHROID 50 MCG TABS (LEVOTHYROXINE SODIUM) one daily  #90 x 6   Entered and Authorized by:   Gordy Savers  MD   Signed by:   Gordy Savers  MD on 04/05/2010   Method used:   Electronically to        [facility]* (retail)       [address] [place], [place]  [address]       Ph: [phone] or [phone]       Fax: [phone]   RxID:   [id number] NASONEX 50 MCG/ACT SUSP (MOMETASONE FUROATE) used daily  #3 x 6   Entered and Authorized by:   Gordy Savers  MD   Signed by:   Gordy Savers  MD on 04/05/2010   Method used:   Electronically to        [facility]* (retail)       [address] [place], [place]  [address]       Ph: [phone] or [phone]       Fax: [phone]   RxID:   [id number] LISINOPRIL-HYDROCHLOROTHIAZIDE 20-12.5 MG TABS (LISINOPRIL-HYDROCHLOROTHIAZIDE) one daily  #90 x 6   Entered and Authorized by:   Gordy Savers  MD   Signed by:   Gordy Savers  MD on 04/05/2010   Method used:   Electronically to        [facility]* (retail)       [address] [place], [place]  [address]       Ph: [phone] or [phone]       Fax: [phone]   RxID:   [id number] PIROXICAM 20 MG  CAPS (PIROXICAM) as needed  #90 x 6   Entered and Authorized by:   Gordy Savers   MD   Signed by:   Gordy Savers  MD on 04/05/2010   Method used:   Electronically to        [facility]* (retail)       [address] [place], [place]  [address]       Ph: [phone] or [phone]       Fax: [phone]   RxID:   [id number] TRAMADOL HCL 50 MG  TABS (TRAMADOL HCL) 1 q6h as needed  #90 x 6   Entered and Authorized by:   Gordy Savers  MD   Signed by:   Gordy Savers  MD on 04/05/2010   Method used:   Electronically to        [facility]* (retail)       [address] [place], [place]  [address]       Ph: [phone] or [phone]       Fax: [phone]   RxID:   [id number]    Orders Added: 1)  Est. Patient Level III [89211]  Appended Document: Orders Update    Clinical Lists Changes  Orders: Added new Service order of Depo- Medrol 80mg  (J1040) - Signed       Medication Administration  Injection # 1:    Medication: Depo- Medrol 80mg     Diagnosis: OSTEOARTHRITIS (ICD-715.90)    Route: IM    Site: R deltoid    Exp Date: 09/2012    Lot #: [id number]    Mfr: Pharmacia    Patient tolerated injection without complications    Given by: [place]  Kirkland LPN (April 05, 2010 9:10 AM)  Orders Added: 1)  Depo- Medrol 80mg  [J1040]

## 2010-05-02 ENCOUNTER — Encounter: Payer: Self-pay | Admitting: Internal Medicine

## 2010-05-02 ENCOUNTER — Ambulatory Visit (INDEPENDENT_AMBULATORY_CARE_PROVIDER_SITE_OTHER): Payer: PRIVATE HEALTH INSURANCE | Admitting: Internal Medicine

## 2010-05-02 VITALS — BP 110/80 | HR 80 | Temp 98.4°F

## 2010-05-02 DIAGNOSIS — M5430 Sciatica, unspecified side: Secondary | ICD-10-CM

## 2010-05-02 MED ORDER — KETOROLAC TROMETHAMINE 60 MG/2ML IM SOLN: 60.0000 mg | Freq: Once | INTRAMUSCULAR | Status: AC

## 2010-05-02 MED ORDER — OXYCODONE-ACETAMINOPHEN 5-325 MG PO TABS: 1.0000 | ORAL_TABLET | Freq: Four times a day (QID) | ORAL | Status: AC | PRN

## 2010-05-02 MED ORDER — METHYLPREDNISOLONE ACETATE 80 MG/ML IJ SUSP
80.0000 mg | Freq: Once | INTRAMUSCULAR | Status: AC
  Administered 2010-05-02: 80 mg via INTRAMUSCULAR

## 2010-05-02 MED ORDER — KETOROLAC TROMETHAMINE 60 MG/2ML IM SOLN
60.0000 mg | Freq: Once | INTRAMUSCULAR | Status: AC
  Administered 2010-05-02: 60 mg via INTRAMUSCULAR

## 2010-05-02 MED ORDER — METHYLPREDNISOLONE ACETATE 80 MG/ML IJ SUSP: 80.0000 mg | Freq: Once | INTRAMUSCULAR | Status: DC

## 2010-05-02 MED ORDER — PREDNISONE 10 MG PO TABS: ORAL_TABLET | ORAL | Status: DC

## 2010-05-02 NOTE — Progress Notes (Signed)
  Subjective:    Patient ID: [name], male    DOB: October 16, 1950, 60 y.o.   MRN: [id number]  HPI Pt presents to clinic for evaluation of leg pain. Pt states has been recently treated for possible right trochanteric bursitis with depomedrol injxn 1/31. Right lateral upper leg pain had continued mildly however yesterday after bending down developed acute onset of right gluteal pain/sacral pain that appears to radiate down right leg posteriorly. Notes associated numbness of bilateral legs R>L. No focal leg weakness but pain is severe and has limited ability to ambulate well. Today attempting to use crutches. No injury/trauma or urinary incontinence. Has attempted ultram prn without significant improvement of pain. Pain is exacerbated by movement especially change in position sitting to lying or standing.  Chart review indicates h/o prostate cancer and pt states PSA followed by PMD with reported undetectable values. No other alleviating or exacerbating factors.   Reviewed PMH, medications, and allergies.    Review of Systems See HPI     Objective:   Physical Exam  Constitutional: Vital signs are normal. He appears well-developed. He appears distressed.       Intermittently appears in mild to moderate distress related to pain which occurs with position change.  HENT:  Head: Normocephalic and atraumatic.  Right Ear: External ear normal.  Left Ear: External ear normal.  Eyes: Conjunctivae are normal. No scleral icterus.  Musculoskeletal:       Lumbar back: He exhibits no tenderness, no bony tenderness, no edema, no deformity, no laceration and no spasm.       No midline LS tenderness or bony abnormality. No obvious paraspinal muscle spasm. + mild tenderness to palpation near SI joint.  Neurological: He is alert. He displays no tremor.       + SLR right with reproduction of radiating pain. - SLR on left. Bilateral LE strength 5/5 with variability likely due to pain. Ambulates slowly with pain.   Skin: Skin is warm and dry. No rash noted. No erythema. No pallor.          Assessment & Plan:

## 2010-05-02 NOTE — Assessment & Plan Note (Signed)
Neurologically non focal on exam but complicated by moderately severe pain currently. Suspect lumbar nerve impingement based on hx and exam. Given toradol 60mg  IM as well as depomedrol 80mg  IM. Begin prednisone taper. Stop ultram and begin percocet prn. Obtain LS plain radiograph. Work note provided through 3/1. Schedule close followup in 3 days or sooner if necessary.  Recommend presentation to local ED if pain worsens/refractory to po medication or development of focal leg weakness or urinary incontinence. States understanding and agreement. Discharged by wheelchair and pt's brother contacted for transportation.

## 2010-05-04 ENCOUNTER — Encounter: Payer: Self-pay | Admitting: Internal Medicine

## 2010-05-04 ENCOUNTER — Emergency Department (HOSPITAL_COMMUNITY): Payer: PRIVATE HEALTH INSURANCE

## 2010-05-04 ENCOUNTER — Ambulatory Visit: Payer: PRIVATE HEALTH INSURANCE | Admitting: Internal Medicine

## 2010-05-04 ENCOUNTER — Inpatient Hospital Stay (HOSPITAL_COMMUNITY)
Admission: EM | Admit: 2010-05-04 | Discharge: 2010-05-07 | Disposition: A | Discharge status: Other Acute Inpatient Hospital | Payer: PRIVATE HEALTH INSURANCE | Source: Home / Self Care | Attending: Internal Medicine | Admitting: Internal Medicine

## 2010-05-04 DIAGNOSIS — Z8546 Personal history of malignant neoplasm of prostate: Secondary | ICD-10-CM

## 2010-05-04 DIAGNOSIS — F329 Major depressive disorder, single episode, unspecified: Secondary | ICD-10-CM | POA: Diagnosis present

## 2010-05-04 DIAGNOSIS — I10 Essential (primary) hypertension: Secondary | ICD-10-CM | POA: Diagnosis present

## 2010-05-04 DIAGNOSIS — Z8673 Personal history of transient ischemic attack (TIA), and cerebral infarction without residual deficits: Secondary | ICD-10-CM

## 2010-05-04 DIAGNOSIS — M4310 Spondylolisthesis, site unspecified: Secondary | ICD-10-CM | POA: Diagnosis present

## 2010-05-04 DIAGNOSIS — F3289 Other specified depressive episodes: Secondary | ICD-10-CM | POA: Diagnosis present

## 2010-05-04 DIAGNOSIS — I498 Other specified cardiac arrhythmias: Secondary | ICD-10-CM | POA: Diagnosis not present

## 2010-05-04 DIAGNOSIS — M5126 Other intervertebral disc displacement, lumbar region: Secondary | ICD-10-CM | POA: Diagnosis present

## 2010-05-04 DIAGNOSIS — E039 Hypothyroidism, unspecified: Secondary | ICD-10-CM | POA: Diagnosis present

## 2010-05-04 DIAGNOSIS — M48062 Spinal stenosis, lumbar region with neurogenic claudication: Principal | ICD-10-CM | POA: Diagnosis present

## 2010-05-04 DIAGNOSIS — M48061 Spinal stenosis, lumbar region without neurogenic claudication: Secondary | ICD-10-CM | POA: Diagnosis present

## 2010-05-04 LAB — DIFFERENTIAL
Basophils Relative: 0 % (ref 0–1)
Eosinophils Absolute: 0 10*3/uL (ref 0.0–0.7)
Lymphs Abs: 0.9 10*3/uL (ref 0.7–4.0)
Monocytes Absolute: 0.4 10*3/uL (ref 0.1–1.0)
Monocytes Relative: 4 % (ref 3–12)

## 2010-05-04 LAB — BASIC METABOLIC PANEL
BUN: 34 mg/dL — ABNORMAL HIGH (ref 6–23)
CO2: 21 mEq/L (ref 19–32)
Calcium: 9.2 mg/dL (ref 8.4–10.5)
Creatinine, Ser: 0.96 mg/dL (ref 0.4–1.5)
GFR calc Af Amer: >60 mL/min (ref >60)

## 2010-05-04 LAB — CBC
Hemoglobin: 14.7 g/dL (ref 13.0–17.0)
MCH: 30.8 pg (ref 26.0–34.0)
MCHC: 33.8 g/dL (ref 30.0–36.0)
MCV: 91 fL (ref 78.0–100.0)
Platelets: 237 10*3/uL (ref 150–400)

## 2010-05-05 ENCOUNTER — Inpatient Hospital Stay (HOSPITAL_COMMUNITY): Payer: PRIVATE HEALTH INSURANCE

## 2010-05-05 LAB — COMPREHENSIVE METABOLIC PANEL
AST: 16 U/L (ref 0–37)
BUN: 33 mg/dL — ABNORMAL HIGH (ref 6–23)
CO2: 25 mEq/L (ref 19–32)
Calcium: 8.8 mg/dL (ref 8.4–10.5)
Chloride: 108 mEq/L (ref 96–112)
Creatinine, Ser: 1.06 mg/dL (ref 0.4–1.5)
GFR calc Af Amer: >60 mL/min (ref >60)
GFR calc non Af Amer: >60 mL/min (ref >60)
Glucose, Bld: 131 mg/dL — ABNORMAL HIGH (ref 70–99)
Total Bilirubin: 0.6 mg/dL (ref 0.3–1.2)

## 2010-05-05 LAB — CBC
Hemoglobin: 13.7 g/dL (ref 13.0–17.0)
MCH: 30.4 pg (ref 26.0–34.0)
MCHC: 33.3 g/dL (ref 30.0–36.0)
MCV: 91.1 fL (ref 78.0–100.0)
RBC: 4.51 MIL/uL (ref 4.22–5.81)

## 2010-05-05 LAB — PROTIME-INR: INR: 0.96 (ref 0.00–1.49)

## 2010-05-05 LAB — MAGNESIUM: Magnesium: 2.2 mg/dL (ref 1.5–2.5)

## 2010-05-06 NOTE — H&P (Addendum)
NAME:  [name], [name] NO.:  [id number]  MEDICAL RECORD NO.:  [id number]           PATIENT TYPE:  E  LOCATION:  WLED                         FACILITY:  [facility]  PHYSICIAN:  Vania Rea, M.D. DATE OF BIRTH:  March 10, 1950  DATE OF ADMISSION:  05/04/2010 DATE OF DISCHARGE:                             HISTORY & PHYSICAL   PRIMARY CARE PHYSICIAN:  Gordy Savers, MD.  The patient is also seen at the [facility] where he has orthopedic surgeons.  CHIEF COMPLAINT:  Difficulty walking, worsening back and hip pain.  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  He works as a Electrical engineer, does a lot of standing and walking and since his days in [employer], has had a lot of orthopedic problems.  He has a remote history of a crush injury to his right leg and damage to his right knee, which has affected his right hip, and he has severe osteoarthritis in those areas.  He also apparently has been getting treated with cortisone shots for right hip bursitis.  He has been having chronically worsening lower back pain and having difficulty walking, difficulty being comfortable, and has now been reduced to walking with crutches.  He cares for a disabled brother at home and is now unable to care for his brother, unable to care for himself because of the intractable pain and has presented to the emergency room to get some relief.  The patient also reports that because of his gait instability, a few days ago, he fell and hit his head, got a laceration to his forehead, was unconscious for an unknown period of time, did not seek medical attention.  PAST MEDICAL HISTORY: 1. Past history of prostate cancer. 2. Depression. 3. Osteoarthritis. 4. Insomnia. 5. Hypertension. 6. Colonic polyps. 7. Hypothyroidism. 8. Left caudate lacunar stroke in March 2011.  PAST SURGICAL HISTORY:   Includes, 1. Left knee meniscus surgery. 2. Elbow surgery. 3. Tonsillectomy. 4. Prostatectomy in 1998. 5. Inguinal herniorrhaphy. 6. Status post fracture of the right leg. 7. Status post colonoscopy and sigmoidoscopy in 1998. 8. Status post colonoscopy in June 2010.  MEDICATIONS:  The patient says his medications have not changed since his visit to his primary care physician on April 05, 2010, and we note at that time, he received 80 mg of Depo-Medrol into his deltoid and his medications are listed as; 1. Tramadol 50 mg every 6 hours as needed. 2. Piroxicam 20 mg daily as needed. 3. Lisinopril/hydrochlorothiazide 20/12.5 daily. 4. Nasonex spray daily. 5. Aspirin 325 mg daily. 6. Levothyroxine 50 mcg daily. 7. Lipitor 20 mg daily. 8. Paroxetine 20 mg each morning. 9. Advil 200 mg 3 times daily when necessary. 10.The patient also takes BC powder as needed.  SOCIAL HISTORY:  He quit smoking 20 years ago.  Reports that he drinks 6 to 12 whiskeys episodically on his days off, but says he typically does not get more than 3 days off per month.  FAMILY HISTORY:  Significant for cancers, heart disease, and diabetes.  REVIEW OF  SYSTEMS:  Review of systems other than noted above unremarkable.  PHYSICAL EXAMINATION:  GENERAL:  Depressed looking middle-aged Caucasian gentleman, reclining flat on the stretcher, keeps his right knee flexed, and supported on a pillow. VITAL SIGNS:  His temperature is 98.2, pulse 62, respirations 16, blood pressure 115/72.  He is saturating at 95% on room air. HEENT:  His pupils are round, equal, and reactive.  Mucous membranes pink, anicteric.  He is mildly dehydrated. NECK:  No cervical lymphadenopathy or thyromegaly.  No carotid bruit. No jugular venous distention. CHEST:  Clear to auscultation bilaterally. CARDIOVASCULAR SYSTEM:  Regular rhythm.  No murmurs. ABDOMEN:  Obese, soft, nontender. EXTREMITIES:  He has arthritic deformities of both knees  and scar of the left knee, status post meniscal repair.  He is tender over his lower lumbar spine, although not markedly.  Because of his discomfort, his position was not adjusted from a lying position for the exam. CENTRAL NERVOUS SYSTEM:  Cranial nerves II through XII are grossly intact.  He has no focal neurologic deficit.  LABORATORY DATA:  His white count is 9.5, hemoglobin 14.7, platelets 233,000.  His neutrophil count is unremarkable given the fact that he has recently had a steroid shot, he does have an appropriate leukocyte and neutrophilia.  His sodium is 137, potassium 4.4, chloride 106, CO2 21, glucose 109, BUN 34, creatinine 0.96, calcium 9.2.  X-rays of his lumbar spine shows spondylosis with no acute findings.  MRI of his lumbar spine shows severe spinal stenosis at L3-L4 due to disk and facet degeneration.  At L4-L5, there is severe spinal stenosis with right- sided extruded disk fragment in the canal and severe facet degeneration. Disk degeneration and spondylosis at L5-S1 with foraminal encroachment.  ASSESSMENT: 1. Severe spinal stenosis. 2. Severe osteoarthritis. 3. Gait abnormality secondary to the above. 4. Hypertension. 5. Hypothyroidism. 6. Depression. 7. Hyperlipidemia.  PLAN: 1. We will admit this gentleman in for assistance with pain management     and physical therapy, and we have been informed that Dr. Phoebe Perch,     spinal surgeon has already been consulted and will evaluate the     patient and there is a note in the chart that he feels that there     is no operative management at this time. 2. Plan will be as per orders.     Vania Rea, M.D.     LC/MEDQ  D:  05/04/2010  T:  05/04/2010  Job:  [id number]  cc:   Gordy Savers, MD [address] [place] [address]  Electronically Signed by Vania Rea M.D. on 05/06/2010 02:58:45 AM

## 2010-05-07 ENCOUNTER — Inpatient Hospital Stay (HOSPITAL_COMMUNITY)
Admission: AD | Admit: 2010-05-07 | Discharge: 2010-05-16 | DRG: 460 | Disposition: A | Discharge status: Home / Self Care | Payer: PRIVATE HEALTH INSURANCE | Source: Other Acute Inpatient Hospital | Attending: Neurosurgery | Admitting: Neurosurgery

## 2010-05-07 ENCOUNTER — Inpatient Hospital Stay (HOSPITAL_COMMUNITY): Payer: PRIVATE HEALTH INSURANCE

## 2010-05-07 LAB — BASIC METABOLIC PANEL
BUN: 21 mg/dL (ref 6–23)
Chloride: 109 mEq/L (ref 96–112)
Creatinine, Ser: 0.97 mg/dL (ref 0.4–1.5)

## 2010-05-08 ENCOUNTER — Inpatient Hospital Stay (HOSPITAL_COMMUNITY): Payer: PRIVATE HEALTH INSURANCE

## 2010-05-08 DIAGNOSIS — I495 Sick sinus syndrome: Secondary | ICD-10-CM

## 2010-05-08 DIAGNOSIS — Z01810 Encounter for preprocedural cardiovascular examination: Secondary | ICD-10-CM

## 2010-05-08 LAB — CBC
MCHC: 34 g/dL (ref 30.0–36.0)
MCV: 90.2 fL (ref 78.0–100.0)
Platelets: 232 10*3/uL (ref 150–400)
RDW: 14.3 % (ref 11.5–15.5)
WBC: 12.5 10*3/uL — ABNORMAL HIGH (ref 4.0–10.5)

## 2010-05-08 LAB — BASIC METABOLIC PANEL
BUN: 23 mg/dL (ref 6–23)
CO2: 26 mEq/L (ref 19–32)
Chloride: 106 mEq/L (ref 96–112)
Creatinine, Ser: 0.98 mg/dL (ref 0.4–1.5)
Glucose, Bld: 108 mg/dL — ABNORMAL HIGH (ref 70–99)

## 2010-05-08 LAB — APTT: aPTT: 25 seconds (ref 24–37)

## 2010-05-08 LAB — TSH: TSH: 4.179 u[IU]/mL (ref 0.350–4.500)

## 2010-05-09 LAB — ABO/RH: ABO/RH(D): A POS

## 2010-05-09 LAB — MRSA PCR SCREENING: MRSA by PCR: NEGATIVE

## 2010-05-09 NOTE — Consult Note (Signed)
NAMEEDWARDO, [name] NO.:  [id number]  MEDICAL RECORD NO.:  [id number]           PATIENT TYPE:  I  LOCATION:  3036                         FACILITY:  [facility]  PHYSICIAN:  Cassell Clement, M.D. DATE OF BIRTH:  August 10, 1950  DATE OF CONSULTATION:  05/08/2010 DATE OF DISCHARGE:                                CONSULTATION   PRIMARY CARE PHYSICIAN:  Gordy Savers, MD  We were asked to see this 60 year old Caucasian gentleman who was admitted in transfer from [facility] with severe symptomatic spinal stenosis and degenerative low back disease.  He is being prepared for neurosurgery tomorrow.  We are asked to see him preoperatively regarding sinus bradycardia.  The patient has a history of having had a lacunar stroke in March 2011. This did not require hospitalization.  He has a past history of essential hypertension followed by Dr. Amador Cunas.  He does not have any history of known coronary artery disease.  He states that he had a normal treadmill stress test in [place], [place] about 10 years ago.  He denies any exertional chest pain or shortness of breath.  He is not having any orthopnea or paroxysmal nocturnal dyspnea or peripheral edema.  In regard to his sinus bradycardia, he denies any symptoms of dizziness or syncope.  Of note is the fact that he does have a history of hypothyroidism and is on levothyroxine and a TSH level is pending.  FAMILY HISTORY:  His father died of heart attack and his mother died in a car accident and it was suspected that he had a sudden heart attack causing the accident.  He also has a sister who has had coronary artery disease.  SOCIAL HISTORY:  He no longer smokes cigarettes, but does chew tobacco. He drinks whiskey several times a month.  REVIEW OF SYSTEMS:  No change in gastrointestinal or genitourinary function.  No fever or chills.  No cough or sputum production.  All other systems are  negative.  PHYSICAL EXAMINATION:  VITAL SIGNS:  His blood pressure is 109/73. Pulse is 60 and normal sinus rhythm.  O2 saturation on room air is 94%. Respirations are 16. GENERAL:  This is a well-developed, well-nourished gentleman in no distress. SKIN:  Multiple tattoos. HEAD AND NECK:  Pupils are equal and reactive.  There are no carotid bruits.  The jugular venous pressure is normal.  Thyroid is not enlarged or tender.  There is no lymphadenopathy. CHEST:  Clear to percussion and auscultation. HEART:  A quiet precordium without murmur, gallop, rub, or click. ABDOMEN:  No organomegaly or masses. EXTREMITIES:  Good peripheral pulses.  No phlebitis or edema.  His electrocardiogram done on May 07, 2010, at 0559 showed marked sinus bradycardia and 41 per minute, otherwise normal.  Here on 3000 on telemetry, his pulse has been in the 60s during the day.  His chest x- ray was negative with a normal heart size and clear lungs.  Laboratory studies are normal and his TSH has not yet been reported.  IMPRESSION: 1. Asymptomatic sinus bradycardia. 2. Essential hypertension, controlled on medicines. 3. Hypothyroidism. 4. Severe symptomatic spinal stenosis  as well as L3-L4 disk     degeneration with herniated disk. 5. Hypercholesterolemia, on Lipitor.  RECOMMENDATIONS:  Okay to proceed with neurosurgery tomorrow as planned. He is not presently on any medications which would be causing his bradycardia.  We will be sure to follow up on his TSH level requested for this morning to be sure that he is on the proper dose of Levothroid.  Many thanks for the opportunity to see this pleasant gentleman with you. We will follow with you through the perioperative period.          ______________________________ Cassell Clement, M.D.     TB/MEDQ  D:  05/08/2010  T:  05/08/2010  Job:  [id number]  cc:   Cristi Loron, M.D. [facility]  Electronically Signed by Cassell Clement  M.D. on 05/09/2010 06:47:52 PM

## 2010-05-12 ENCOUNTER — Inpatient Hospital Stay (HOSPITAL_COMMUNITY): Payer: PRIVATE HEALTH INSURANCE

## 2010-05-12 NOTE — Discharge Summary (Addendum)
[name], [name]              ACCOUNT NO.:  [id number]  MEDICAL RECORD NO.:  [id number]           PATIENT TYPE:  I  LOCATION:  1536                         FACILITY:  [facility]  PHYSICIAN:  Hillery Aldo, M.D.   DATE OF BIRTH:  02-Apr-1950  DATE OF ADMISSION:  05/04/2010 DATE OF DISCHARGE:  05/07/2010                              DISCHARGE SUMMARY   PRIMARY CARE PHYSICIAN:  Gordy Savers, MD.  CURRENT DIAGNOSES: 1. Severe spinal stenosis. 2. Hypothyroidism. 3. Depression. 4. Hypertension. 5. Cerebrovascular disease with history of stroke. 6. History of prostate cancer. 7. Bradycardia.  MEDICATIONS ON TRANSFER: 1. Aspirin 81 mg p.o. daily. 2. Lovenox 40 mg subcutaneously daily. 3. Flonase 2 sprays intranasally daily. 4. Hydrochlorothiazide 12.5 mg p.o. daily. 5. Synthroid 50 mcg p.o. daily. 6. Lisinopril 20 mg p.o. daily. 7. OxyContin 10 mg p.o. q.12 h. 8. Paxil 20 mg p.o. daily. 9. Feldene 20 mg p.o. daily. 10.Prednisone 60 mg p.o. daily. 11.Lyrica 50 mg p.o. t.i.d. 12.Zocor 40 mg p.o. daily. 13.Vitamin D 100 international units p.o. daily. 14.Tylenol 650 mg p.o. q.4 h. p.r.n. 15.Dulcolax suppository 10 mg PR daily p.r.n. constipation. 16.Dilaudid 1-2 mg IV q.4 h. p.r.n. severe pain. 17.Zofran 4 mg p.o. or IV q.6 h. p.r.n. nausea or vomiting. 18.Oxycodone 5 mg p.o. q.4 h. p.r.n. pain. 19.Senokot 2 tablets p.o. daily p.r.n. constipation. 20.Fleet Enema 1 per rectum daily p.r.n. constipation. 21.Tramadol 50 mg p.o. q.6 h. p.r.n. pain.  CONSULTATIONS:  Clydene Fake, M.D., and Cristi Loron, M.D., of Neurosurgery.  BRIEF ADMISSION HISTORY OF PRESENT ILLNESS:  The patient is a 60 year old male who presented to the hospital with a gait disturbance secondary to severe back and right sciatic-type pain.  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  Upon initial evaluation in the emergency department, the patient was found to have severe spinal stenosis on MRI.  Consultation was made with Dr. Phoebe Perch, who felt that the patient could follow up as an outpatient for consideration of surgery.  To address his acute pain needs, he was referred for admission to the hospitalist service.  For the full details, please see the dictated report done by Dr. Orvan Falconer.  PROCEDURES AND DIAGNOSTIC STUDIES: 1. Lumbar spine films on May 04, 2010, showed spondylosis with no     acute findings. 2. MRI of the lumbar spine on May 04, 2010, showed severe spinal     stenosis due to disk and facet degeneration at L3-L4.  Severe     spinal stenosis with right-sided extruded disk fragment in the     canal and severe facet degeneration at L4-L5.  Disk degeneration     and spondylosis at L5-S1 with foraminal encroachment bilaterally. 3. CT scan of the head on May 05, 2010, was negative for bleed or  other acute intracranial process.  DISCHARGE LABORATORY VALUES:  Sodium was 141, potassium 4.3, chloride 109, bicarb 28, BUN 21, creatinine 0.97, glucose 94, calcium 9.0.  HOSPITAL COURSE BY PROBLEM: 1. Severe spinal stenosis:  The patient was admitted for pain control     with the plan to discharge him home and arrange for outpatient     followup to see Dr. Phoebe Perch.  Unfortunately, the patient is unable     to return home at this time due to ongoing severe pain and gait     instability.  He has been treated with a trial of IV and p.o.     steroids.  He has been put on Lyrica as well.  None of these     therapies have appreciably improved his symptoms to the point where     it would be safe to discharge him home.  At this point, I have     spoken with Dr.  Lovell Sheehan who agrees to see him in formal     consultation and for consideration of neurosurgical intervention at     [facility].  As such, the patient is being transferred to     [facility] to expedite this process. 2. Hypothyroidism:  The patient has been maintained on his usual dose     of Synthroid. 3. Depression:  The patient has been maintained on Paxil. 4. Hypertension:  The patient's blood pressure is well controlled. 5. Cerebrovascular disease with history of stroke.  The patient did     have a CT scan, which did not show any acute abnormalities. 6. History of prostate cancer:  The patient can follow up with his     urologist. 7. Bradycardia:  The patient had transient bradycardia last night.  We     are checking a TSH to ensure he is not over replaced.  His heart     rate has been in the 80s today.  DISPOSITION:  The patient is medically stable for transfer to [facility] for formal neurosurgery consultation.  A discharge summary addendum will be dictated at the time of actual discharge.     Hillery Aldo, M.D.     CR/MEDQ  D:  05/07/2010  T:  05/07/2010  Job:  [id number]  cc:   Gordy Savers, MD [address] [place] [address]  Electronically Signed by Hillery Aldo M.D. on 05/10/2010 07:43:20 PM

## 2010-05-13 LAB — BASIC METABOLIC PANEL
CO2: 29 mEq/L (ref 19–32)
Chloride: 97 mEq/L (ref 96–112)
GFR calc Af Amer: >60 mL/min (ref >60)
Sodium: 133 mEq/L — ABNORMAL LOW (ref 135–145)

## 2010-05-13 LAB — CBC
Hemoglobin: 14.1 g/dL (ref 13.0–17.0)
Platelets: 199 10*3/uL (ref 150–400)
RBC: 4.67 MIL/uL (ref 4.22–5.81)
WBC: 17.5 10*3/uL — ABNORMAL HIGH (ref 4.0–10.5)

## 2010-05-13 NOTE — Consult Note (Signed)
[name], [name] NO.:  [id number]  MEDICAL RECORD NO.:  [id number]           PATIENT TYPE:  I  LOCATION:  3036                         FACILITY:  [facility]  PHYSICIAN:  Cristi Loron, M.D.DATE OF BIRTH:  03-30-1950  DATE OF CONSULTATION:  05/07/2010 DATE OF DISCHARGE:                                CONSULTATION   CHIEF COMPLAINT:  "I can't walk," right leg pain.  HISTORY OF PRESENT ILLNESS:  The patient is a 60 year old white male who began having trouble with standing and walking about a month ago.  He does recall any precipitating event.  He initially saw Dr. Amador Cunas who treated him with a course of injection.  Unfortunately, it did not help.  He was then treated without medications.  The patient's pain became unbearable about a week ago to the point where "I couldn't walk," describing bilateral hip and leg pain, right greater than left with standing and walking.  His right leg became weak.  The patient went to [facility] where he was admitted by [facility], worked up further.  The patient was worked up further with a lumbar MRI which demonstrated severe spinal stenosis and spondylolisthesis to L4-5 with a large ruptured disk.  He also had more moderate stenosis to L3-4.  Dr. Darnelle Catalan contacted Dr. Phoebe Perch who recommended medical management and to have a followup with him in the office as an outpatient.  Dr. Phoebe Perch never saw the patient.  The patient unfortunately did not improve despite time and medical management.  I spoke to Dr. Darnelle Catalan today and she arranged for the patient be transferred over the [facility] for further evaluation and management.  Presently, the patient is alert, pleasant.  He complains mainly of right leg pain.  He also admits to some chronic back, hip, and bilateral leg pain.  He has some numbness in his right leg.  It feels weak and as above, he says he cannot walk.  He is taking a few falls  because of his leg pain and in fact suffered a laceration of his forehead with one of these falls.  PAST MEDICAL HISTORY:  Positive for: 1. Hypertension. 2. Evidently had a TIA in the past. 3. He had a right leg fracture. 4. Prostate cancer. 5. Inguinal hernia.  PAST SURGICAL HISTORY: 1. Left knee and left elbow surgery. 2. Prostatectomy. 3. Herniorrhaphy.  DRUG ALLERGIES:  FURICIN, (?) spelling.  FAMILY HISTORY:  Noncontributory.  SOCIAL HISTORY:  The patient is divorced x3.  He has a grown son.  He is employed as a Electrical engineer at [employer]  He denies tobacco and drug use.  He occasionally drinks alcohol.  REVIEW OF SYSTEMS:  Negative except as above.  PHYSICAL EXAMINATION:  GENERAL:  A pleasant obese 60 year old white male, in no apparent distress. HEENT:  Normocephalic.  The patient does have a healing laceration over his forehead.  His pupils are equal, round, react to light.  Extraocular muscles intact.  Oropharynx benign. NECK:  Supple without masses or deformities.  He has a mildly decreased cervical range of motion appropriate for age.  Spurling testing  was negative.  Lhermitte sign was not present.  Thorax symmetric. ABDOMEN:  Soft, protuberant. EXTREMITIES:  He has some diffuse arthritic changes. BACK:  There is no point tenderness.  No obvious deformities.  Pearlean Brownie testing is negative bilaterally.  Straight-leg sensing is positive on the right, negative on the left. NEUROLOGIC:  The patient is alert and oriented x3.  Cranial nerves II- XII examined bilaterally, grossly normal.  Vision and hearing are grossly normal bilaterally.  Motor strength is 5/5 in his bilateral handgrip, biceps, triceps, deltoid, psoas, quadriceps, gastrocnemius, and left dorsiflexors/EHL.  He has some slight weakness in his right dorsiflexor and EHL 4+/5.  Cerebellar exam is intact to rapid alternating movements of the upper extremities bilaterally.  His deep tendon reflexes  are symmetric.  There is no ankle clonus.  Sensory exam is grossly normal to light touch sensation in all tested dermatomes bilaterally.  IMAGING STUDIES:  Reviewed.  The patient's lumbar MRI performed May 04, 2010, at [facility].  The sagittal images demonstrate the patient has a grade 1 acquired spondylolisthesis to L4-5.  He has disk degeneration with loss of disk space height and disk desiccation at L3- 4, L4-5, L5-S1.  He has some disk desiccation to L2-3 as well.  On axial images at L2-3 and L5-S1 are fairly unremarkable.  L3-4 has moderate multifactorial spinal stenosis with bilateral facet arthropathy.  L4-5 has some bilateral facet arthropathy and a right-sided herniated disk with severe spinal stenosis.  ASSESSMENT/PLAN:  L3-4 and L4-5 disk degeneration, herniated nucleus pulposus, spinal stenosis, spondylolisthesis, neurogenic claudication. I have discussed the situation with the patient.  We have discussed various treatments including doing nothing, continuing medical management, more physical therapy, injections (I doubt it will help), and surgery.  I briefly mentioned to him lumbar decompression and fusion.  I will discuss it further with him tomorrow.  I have answered all the patient's questions.     Cristi Loron, M.D.     JDJ/MEDQ  D:  05/07/2010  T:  05/08/2010  Job:  [id number]  cc:   Gordy Savers, MD  Electronically Signed by Tressie Stalker M.D. on 05/12/2010 02:43:06 PM

## 2010-05-29 ENCOUNTER — Inpatient Hospital Stay (HOSPITAL_COMMUNITY)
Admission: EM | Admit: 2010-05-29 | Discharge: 2010-06-03 | DRG: 391 | Disposition: A | Discharge status: Home / Self Care | Payer: PRIVATE HEALTH INSURANCE | Attending: Internal Medicine | Admitting: Internal Medicine

## 2010-05-29 DIAGNOSIS — E039 Hypothyroidism, unspecified: Secondary | ICD-10-CM | POA: Diagnosis present

## 2010-05-29 DIAGNOSIS — D62 Acute posthemorrhagic anemia: Secondary | ICD-10-CM | POA: Diagnosis present

## 2010-05-29 DIAGNOSIS — Z8546 Personal history of malignant neoplasm of prostate: Secondary | ICD-10-CM

## 2010-05-29 DIAGNOSIS — A088 Other specified intestinal infections: Principal | ICD-10-CM | POA: Diagnosis present

## 2010-05-29 DIAGNOSIS — F1010 Alcohol abuse, uncomplicated: Secondary | ICD-10-CM | POA: Diagnosis present

## 2010-05-29 DIAGNOSIS — F3289 Other specified depressive episodes: Secondary | ICD-10-CM | POA: Diagnosis present

## 2010-05-29 DIAGNOSIS — I2699 Other pulmonary embolism without acute cor pulmonale: Secondary | ICD-10-CM | POA: Diagnosis present

## 2010-05-29 DIAGNOSIS — Z8673 Personal history of transient ischemic attack (TIA), and cerebral infarction without residual deficits: Secondary | ICD-10-CM

## 2010-05-29 DIAGNOSIS — I959 Hypotension, unspecified: Secondary | ICD-10-CM | POA: Diagnosis not present

## 2010-05-29 DIAGNOSIS — F4310 Post-traumatic stress disorder, unspecified: Secondary | ICD-10-CM | POA: Diagnosis present

## 2010-05-29 DIAGNOSIS — E860 Dehydration: Secondary | ICD-10-CM | POA: Diagnosis present

## 2010-05-29 DIAGNOSIS — J189 Pneumonia, unspecified organism: Secondary | ICD-10-CM | POA: Diagnosis present

## 2010-05-29 DIAGNOSIS — F329 Major depressive disorder, single episode, unspecified: Secondary | ICD-10-CM | POA: Diagnosis present

## 2010-05-29 DIAGNOSIS — I10 Essential (primary) hypertension: Secondary | ICD-10-CM | POA: Diagnosis present

## 2010-05-30 ENCOUNTER — Emergency Department (HOSPITAL_COMMUNITY): Payer: PRIVATE HEALTH INSURANCE

## 2010-05-30 ENCOUNTER — Encounter (HOSPITAL_COMMUNITY): Payer: Self-pay

## 2010-05-30 LAB — COMPREHENSIVE METABOLIC PANEL
ALT: 18 U/L (ref 0–53)
AST: 12 U/L (ref 0–37)
CO2: 27 mEq/L (ref 19–32)
Calcium: 8.8 mg/dL (ref 8.4–10.5)
Chloride: 105 mEq/L (ref 96–112)
GFR calc Af Amer: >60 mL/min (ref >60)
GFR calc non Af Amer: >60 mL/min (ref >60)
Glucose, Bld: 134 mg/dL — ABNORMAL HIGH (ref 70–99)
Sodium: 140 mEq/L (ref 135–145)
Total Bilirubin: 0.4 mg/dL (ref 0.3–1.2)

## 2010-05-30 LAB — RAPID URINE DRUG SCREEN, HOSP PERFORMED
Amphetamines: NOT DETECTED
Tetrahydrocannabinol: NOT DETECTED

## 2010-05-30 LAB — DIFFERENTIAL
Basophils Absolute: 0.1 10*3/uL (ref 0.0–0.1)
Lymphocytes Relative: 10 % — ABNORMAL LOW (ref 12–46)
Monocytes Absolute: 1 10*3/uL (ref 0.1–1.0)
Monocytes Relative: 9 % (ref 3–12)
Neutro Abs: 9.2 10*3/uL — ABNORMAL HIGH (ref 1.7–7.7)
Neutrophils Relative %: 80 % — ABNORMAL HIGH (ref 43–77)

## 2010-05-30 LAB — URINALYSIS, ROUTINE W REFLEX MICROSCOPIC
Bilirubin Urine: NEGATIVE
Hgb urine dipstick: NEGATIVE
Ketones, ur: 15 mg/dL — AB
Nitrite: NEGATIVE
Protein, ur: NEGATIVE mg/dL
Specific Gravity, Urine: >1.046 — ABNORMAL HIGH (ref 1.005–1.030)
Urobilinogen, UA: 1 mg/dL (ref 0.0–1.0)

## 2010-05-30 LAB — CBC
HCT: 34.6 % — ABNORMAL LOW (ref 39.0–52.0)
Hemoglobin: 11 g/dL — ABNORMAL LOW (ref 13.0–17.0)
MCH: 29.6 pg (ref 26.0–34.0)
MCHC: 31.8 g/dL (ref 30.0–36.0)
RBC: 3.72 MIL/uL — ABNORMAL LOW (ref 4.22–5.81)

## 2010-05-30 LAB — LIPASE, BLOOD: Lipase: 22 U/L (ref 11–59)

## 2010-05-30 MED ORDER — IOHEXOL 300 MG/ML  SOLN
100.0000 mL | Freq: Once | INTRAMUSCULAR | Status: AC | PRN
  Administered 2010-05-30: 100 mL via INTRAVENOUS

## 2010-05-30 MED ORDER — IOHEXOL 300 MG/ML  SOLN
80.0000 mL | Freq: Once | INTRAMUSCULAR | Status: AC | PRN
  Administered 2010-05-30: 80 mL via INTRAVENOUS

## 2010-05-30 NOTE — Discharge Summary (Signed)
[name], FORMICA NO.:  [id number]  MEDICAL RECORD NO.:  [id number]           PATIENT TYPE:  I  LOCATION:  3036                         FACILITY:  [facility]  PHYSICIAN:  Cristi Loron, M.D.DATE OF BIRTH:  1950/06/15  DATE OF ADMISSION:  05/07/2010 DATE OF DISCHARGE:  05/16/2010                              DISCHARGE SUMMARY   BRIEF HISTORY:  The patient is a 60 year old white male who has suffered from back, hip, and leg pain consistent with neurogenic claudication. He was admitted to [facility] and worked up with a lumbar MRI which demonstrated severe stenosis at L3-4, 4-5 with a large ruptured disk and spondylolisthesis, etc.  He was initially treated with physical therapy and medications, etc., but failed to improve.  He was transferred to [facility] for further neurosurgical care.  For further details of this admission, please refer to typed history and physical.  HOSPITAL COURSE:  The patient was transferred to [facility] for further neurosurgical care after he failed medical management.  I discussed various treatment options with the patient including surgery. The patient weighed the risks, benefits, and alternatives of surgery and decided to proceed with the operation.  The patient was initially scheduled for surgery on May 09, 2010 but on the morning of surgery was chewing tobacco, so the surgery had to be canceled and it was rescheduled for May 12, 2010.  On May 12, 2010, I performed a L3-4, 4-5 decompression, instrumentation and fusion.  The surgery went well (for full details of this operation, please refer to typed operative note).  POSTOPERATIVE COURSE:  The patient's postoperative course was unremarkable.  We had PT and OT see the patient.  The patient has chronic trouble with his osteoarthritis of knees and was fitted for braces.  By May 16, 2010, the patient was afebrile, his vital signs were stable.   He was eating well and ambulating adequately and requested that he shoud be discharged home. He was therefore discharged home.  DISCHARGE INSTRUCTIONS:  The patient was given oral and written discharge instructions, was instructed to follow up with me in 4 weeks.  DISCHARGE PRESCRIPTIONS:  The patient was given prescription for Percocet 10/325, #100, 1 p.o. q.4 h. p.r.n. for pain and Valium 5 mg 1 p.o. q.6 h. p.r.n. for muscle spasms.  FINAL DIAGNOSES:  L3-4 and L4-5 spinal stenosis, herniated nucleus pulposus, spondylolisthesis, lumbar radiculopathy, lumbago.  PROCEDURE PERFORMED:  L4 laminectomy with bilateral L3 laminotomies to decompress the bilateral L3, L4, and L5 nerve roots; L3-4 and L4-5 posterior lumbar interbody fusion with local morselized autograft bone and active fused bone graft extender; insertion of L3-4 and L4-5 interbody prosthesis (globus PEEK interbody prosthesis); L3-L5 posterior segmental instrumentation with globus titanium pedicle screws and rods; L3-4 and L4-5 posterolateral arthrodesis with local morselized autograft bone and Vitoss bone graft extender.  I should also mention that the patient had some bradycardia during this hospitalization.  We asked the cardiologist to see the patient.  The patient was seen by Dr. Patty Sermons and cleared for surgery.     Cristi Loron, M.D.  JDJ/MEDQ  D:  05/16/2010  T:  05/17/2010  Job:  [id number]  Electronically Signed by Tressie Stalker M.D. on 05/30/2010 09:52:45 AM

## 2010-05-30 NOTE — Op Note (Signed)
NAMESEVERIANO, UTSEY NO.:  [id number]  MEDICAL RECORD NO.:  [id number]           PATIENT TYPE:  I  LOCATION:  3036                         FACILITY:  [facility]  PHYSICIAN:  Cristi Loron, M.D.DATE OF BIRTH:  November 07, 1950  DATE OF PROCEDURE:  05/12/2010 DATE OF DISCHARGE:                              OPERATIVE REPORT   BRIEF HISTORY:  The patient is a 60 year old white male who has suffered from back, hip, and leg pain consistent with neurogenic claudication. He has failed medical management and was worked up with a lumbar MRI which demonstrated that the patient had severe spinal stenosis at L3-4 and L4-5 as well as spondylolisthesis.  I discussed the various treatment options with the patient including surgery.  He has weighed the risks, benefits, and alternatives of surgery and decided to proceed with a L3-4 and L4-5 decompression, instrumentation, and fusion.  PREOPERATIVE DIAGNOSES:  L3-4 and L4-5 spinal stenosis, spondylolisthesis, herniated nucleus pulposus, lumbar radiculopathy, and lumbago.  POSTOPERATIVE DIAGNOSES:  L3-4 and L4-5 spinal stenosis, spondylolisthesis, herniated nucleus pulposus, lumbar radiculopathy, and lumbago.  PROCEDURES:  L4 laminectomy, bilateral L3 laminotomies to decompress the bilateral L3, L4, and L5 nerve roots (to work required to do this was in addition to work required to the posterior lumbar interbody fusion because of the patient's severe facet arthropathy, herniated, etc., required wide laminotomies, foraminotomies, medial facetectomies, etc. to decompress the neural elements); L3-4 and L4-5 posterior lumbar interbody fusion with local morselized autograft bone and Actifuse bone graft extender; insertion of L3-4 and L4-5 interbody prosthesis (Globus PEEK interbody prosthesis); L3-L5 posterior segmental instrumentation with Globus titanium pedicle screws and rods; L3-4 and L4-5 posterolateral arthrodesis with local  morselized autograft bone and Vitoss bone graft extender.  SURGEON:  Cristi Loron, MD  ASSISTANT:  None.  ANESTHESIA:  General endotracheal.  ESTIMATED BLOOD LOSS:  400 mL.  SPECIMENS:  None.  DRAINS:  None.  COMPLICATIONS:  None.  DESCRIPTION OF PROCEDURE:  The patient was brought to the operating room by Anesthesia Team.  General endotracheal anesthesia was induced.  The patient was turned to the prone position on the Wilson frame.  His lumbosacral region was then shaved with clippers and prepared with Betadine scrub and Betadine solution.  Sterile drapes were applied.  I then injected the area to be incised with Marcaine with epinephrine solution.  I used a scalpel to make a linear midline incision over the L3-4 and L4-5 interspaces.  I used an electrocautery to perform a bilateral subperiosteal dissection exposing the spinous process and lamina of L3, L4, and L5.  We obtained intraoperative radiograph to confirm our location and inserted the Versa-Trac retractor for exposure.  We began the decompression by incising the L3-4 and L4-5 interspinous ligament.  I used the Leksell rongeur to remove the spinous process of L4.  I then used a high-speed drill to perform bilateral L3 and L4 laminotomies to complete L4 laminectomy with Kerrison punch and widened the L3 laminotomies.  I removed the L3-4 and L4-5 ligamentum flavum.  I removed the cephalad aspect of the L5 lamina.  I then performed wide foraminotomies about the  bilateral L3, L4, and L5 nerve roots completing the decompression.  We now turned our attention to posterior lumbar interbody fusion and incised the L3-4 and L4-5 intervertebral disks bilaterally.  We then encountered a large herniated disk on the right at L4-5.  We removed it with pituitary forceps.  We then performed a partial intervertebral diskectomy with a pituitary forceps.  We then prepared the vertebral endplates with curettes.  Then, we used  trial spacers and determined to use a 12 x 26 mm interbody prosthesis at both levels bilaterally.  We prefilled these prosthesis with combination of local autograft bone that we obtained during decompression as well as Actifuse bone graft extender.  We inserted the prosthesis in the L4-5 and L5-S1 interspaces. There was a good snug fit bilaterally.  We then filled the remainder of the interspace with local autograft bone and Vitoss completing the posterior lumbar interbody fusion at L3-4 and L4-5.  We now turned our attention to the spinal instrumentation.  Under fluoroscopic guidance, we cannulated the bilateral L3, L4, and L5 pedicles with a bone probe.  I then removed the bone probe and then tapped the pedicles with a 6.5-mm tap.  We removed the tap and then probed inside the tapped pedicles to rule out cortical breaches.  We then inserted 7.5 x 55 mm pedicle screws bilaterally at L3, L4, and L5. We got good bony purchase.  We palpated along the medial aspect of the L3, L4, and L5 pedicles and noted there were no cortical breeches.  We then connected the unilateral pedicle screws with a lordotic rod.  We compressed the construct and secured the rods in place with the capsule which we tightened appropriately.  This completed the instrumentation.  We now turned our attention to the posterolateral arthrodesis.  We used a high-speed drill to decorticate the remainder of the L3-4 and L4-5 facets, pars, transverse process, etc.  I then laid a combination of local autograft bone and Vitoss bone graft extender over these decorticated posterolateral structures.  This completed the posterolateral arthrodesis.  We then inspected the thecal sac and bilateral L3, L4, and L5 nerve roots and noted they were well decompressed.  We achieved hemostasis with bipolar electrocautery.  We irrigated the wound out with bacitracin solution.  We then removed the retractors and then reapproximated  the patient's thoracolumbar fascia with interrupted #1 Vicryl suture, subcutaneous tissue with interrupted 2-0 Vicryl suture, and the skin with Steri-Strips and benzoin.  The wound was then coated with bacitracin ointment and sterile dressing was applied.  The drapes were removed and the patient was subsequently returned to supine position where he was extubated by the Anesthesia Team and transported to the Postanesthesia Care Unit in stable condition.  All sponge, instrument, and needle counts were correct at end of this case.    Cristi Loron, M.D.    JDJ/MEDQ  D:  05/12/2010  T:  05/13/2010  Job:  [id number]  Electronically Signed by Tressie Stalker M.D. on 05/30/2010 09:52:57 AM

## 2010-05-31 LAB — CBC
HCT: 29.4 % — ABNORMAL LOW (ref 39.0–52.0)
Hemoglobin: 9.5 g/dL — ABNORMAL LOW (ref 13.0–17.0)
MCH: 30.7 pg (ref 26.0–34.0)
MCHC: 32.3 g/dL (ref 30.0–36.0)
Platelets: 306 10*3/uL (ref 150–400)
RDW: 16 % — ABNORMAL HIGH (ref 11.5–15.5)
WBC: 6.3 10*3/uL (ref 4.0–10.5)

## 2010-05-31 LAB — CARDIAC PANEL(CRET KIN+CKTOT+MB+TROPI)
CK, MB: 0.8 ng/mL (ref 0.3–4.0)
CK, MB: 1.1 ng/mL (ref 0.3–4.0)
Relative Index: INVALID (ref 0.0–2.5)
Total CK: 23 U/L (ref 7–232)
Total CK: 30 U/L (ref 7–232)
Total CK: 37 U/L (ref 7–232)
Troponin I: 0.01 ng/mL (ref 0.00–0.06)

## 2010-05-31 LAB — URINE CULTURE: Culture  Setup Time: 201203261029

## 2010-05-31 LAB — BASIC METABOLIC PANEL
BUN: 8 mg/dL (ref 6–23)
Chloride: 110 mEq/L (ref 96–112)
Glucose, Bld: 134 mg/dL — ABNORMAL HIGH (ref 70–99)
Potassium: 3.5 mEq/L (ref 3.5–5.1)

## 2010-05-31 LAB — DIFFERENTIAL
Basophils Relative: 1 % (ref 0–1)
Eosinophils Relative: 4 % (ref 0–5)
Lymphocytes Relative: 36 % (ref 12–46)
Monocytes Absolute: 0.6 10*3/uL (ref 0.1–1.0)
Monocytes Relative: 9 % (ref 3–12)
Neutro Abs: 3.1 10*3/uL (ref 1.7–7.7)

## 2010-06-01 DIAGNOSIS — I495 Sick sinus syndrome: Secondary | ICD-10-CM

## 2010-06-01 LAB — PROTIME-INR: INR: 1.41 (ref 0.00–1.49)

## 2010-06-01 LAB — IRON AND TIBC: UIBC: 160 ug/dL

## 2010-06-01 LAB — FOLATE: Folate: 16.8 ng/mL

## 2010-06-01 LAB — CBC
Hemoglobin: 9.7 g/dL — ABNORMAL LOW (ref 13.0–17.0)
MCH: 30.2 pg (ref 26.0–34.0)
MCHC: 31.4 g/dL (ref 30.0–36.0)
Platelets: 328 10*3/uL (ref 150–400)

## 2010-06-01 LAB — BASIC METABOLIC PANEL
CO2: 26 mEq/L (ref 19–32)
Calcium: 8.4 mg/dL (ref 8.4–10.5)
Creatinine, Ser: 0.79 mg/dL (ref 0.4–1.5)
GFR calc Af Amer: >60 mL/min (ref >60)

## 2010-06-01 LAB — FERRITIN: Ferritin: 323 ng/mL — ABNORMAL HIGH (ref 22–322)

## 2010-06-02 LAB — BASIC METABOLIC PANEL
GFR calc Af Amer: >60 mL/min (ref >60)
GFR calc non Af Amer: >60 mL/min (ref >60)
Potassium: 3.7 mEq/L (ref 3.5–5.1)
Sodium: 140 mEq/L (ref 135–145)

## 2010-06-02 LAB — CBC
HCT: 30.4 % — ABNORMAL LOW (ref 39.0–52.0)
Platelets: 306 10*3/uL (ref 150–400)
RDW: 16.3 % — ABNORMAL HIGH (ref 11.5–15.5)
WBC: 6 10*3/uL (ref 4.0–10.5)

## 2010-06-02 LAB — PROTIME-INR
INR: 1.93 — ABNORMAL HIGH (ref 0.00–1.49)
Prothrombin Time: 22.2 seconds — ABNORMAL HIGH (ref 11.6–15.2)

## 2010-06-02 LAB — CLOSTRIDIUM DIFFICILE BY PCR: Toxigenic C. Difficile by PCR: NEGATIVE

## 2010-06-03 ENCOUNTER — Encounter: Payer: Self-pay | Admitting: Internal Medicine

## 2010-06-03 LAB — OVA AND PARASITE EXAMINATION: Ova and parasites: NONE SEEN

## 2010-06-03 LAB — CBC
Hemoglobin: 10.6 g/dL — ABNORMAL LOW (ref 13.0–17.0)
MCH: 30.6 pg (ref 26.0–34.0)
Platelets: 313 10*3/uL (ref 150–400)
RBC: 3.46 MIL/uL — ABNORMAL LOW (ref 4.22–5.81)
WBC: 5.8 10*3/uL (ref 4.0–10.5)

## 2010-06-03 LAB — BASIC METABOLIC PANEL
Calcium: 8.6 mg/dL (ref 8.4–10.5)
GFR calc Af Amer: >60 mL/min (ref >60)
GFR calc non Af Amer: >60 mL/min (ref >60)
Glucose, Bld: 96 mg/dL (ref 70–99)
Potassium: 4.1 mEq/L (ref 3.5–5.1)
Sodium: 141 mEq/L (ref 135–145)

## 2010-06-03 LAB — PROTIME-INR
INR: 2 — ABNORMAL HIGH (ref 0.00–1.49)
Prothrombin Time: 22.8 seconds — ABNORMAL HIGH (ref 11.6–15.2)

## 2010-06-06 ENCOUNTER — Encounter: Payer: Self-pay | Admitting: Internal Medicine

## 2010-06-06 ENCOUNTER — Ambulatory Visit (INDEPENDENT_AMBULATORY_CARE_PROVIDER_SITE_OTHER): Payer: PRIVATE HEALTH INSURANCE | Admitting: Internal Medicine

## 2010-06-06 VITALS — BP 112/74 | Temp 98.6°F | Wt 216.0 lb

## 2010-06-06 DIAGNOSIS — M1990 Unspecified osteoarthritis, unspecified site: Secondary | ICD-10-CM

## 2010-06-06 DIAGNOSIS — I10 Essential (primary) hypertension: Secondary | ICD-10-CM

## 2010-06-06 DIAGNOSIS — E039 Hypothyroidism, unspecified: Secondary | ICD-10-CM

## 2010-06-06 DIAGNOSIS — I2699 Other pulmonary embolism without acute cor pulmonale: Secondary | ICD-10-CM

## 2010-06-06 LAB — POCT INR: INR: 1.8

## 2010-06-06 LAB — STOOL CULTURE

## 2010-06-06 MED ORDER — WARFARIN SODIUM 5 MG PO TABS: 5.0000 mg | ORAL_TABLET | Freq: Every day | ORAL | Status: DC

## 2010-06-06 NOTE — Patient Instructions (Signed)
Return in 3 months for follow-up  Followup prothrombin time as instructed  Limit your sodium (Salt) intake  Please check your blood pressure on a regular basis.  If it is consistently greater than 150/90, please make an office appointment.

## 2010-06-06 NOTE — H&P (Signed)
NAMEMYLEN, MANGAN NO.:  [id number]  MEDICAL RECORD NO.:  [id number]           PATIENT TYPE:  E  LOCATION:  WLED                         FACILITY:  [facility]  PHYSICIAN:  Marcellus Scott, MD     DATE OF BIRTH:  1950-09-18  DATE OF ADMISSION:  05/29/2010 DATE OF DISCHARGE:                             HISTORY & PHYSICAL   PRIMARY CARE PHYSICIAN:  Gordy Savers, MD  NEUROSURGEON:  Cristi Loron, M.D.  CHIEF COMPLAINT:  Intractable nausea, vomiting and abdominal pain.  HISTORY OF PRESENT ILLNESS:  Ms. Antosh is a 60 year old Caucasian male patient with history of hypothyroidism, hypertension, prior CVA with no residual deficit, spinal stenosis status post L3-L4, L4-L5 decompression, instrumentation and fusion on May 12, 2010, who presents with above symptoms.  The patient indicates that he was in his usual state of health until noon time yesterday.  Although the patient is providing a history, there seems to be discrepancies in details and the affect appears strange.  He says since noon yesterday he started having nausea, vomiting and abdominal pain.  He claims he has had 10 episodes of vomiting overall, which consists of yellow-colored material with no blood or coffee grounds.  He denies fevers but had chills.  He denies eating anything unusual or any sickly contact with similar complaints. He has had 3 normal BMs with no black stools or blood.  He says the abdominal pain started after the first couple of episodes of vomiting and indicates to the midabdomen.  He cannot describe the type of pain but says it could get as severe as 8/10 in severity with no real relieving factors.  He took some Alka-Seltzer with no significant relief.  He presented to the Emergency Room where he was given some pain medications and antinausea medications and claims his pain and nausea are better.  He, however, has intermittent nausea and dry heaving.  He specifically  denies any cough or dyspnea or chest pain.  His chest x-ray did not show any acute findings.  His CT of the abdomen and pelvis did not show any acute findings but demonstrated a lingular infiltrate.  The [facility] were requested to evaluate and admit for further management.  PAST MEDICAL HISTORY: 1. Hypothyroidism. 2. Depression. 3. Hypertension. 4. Cerebrovascular accident. 5. Prostate cancer. 6. Bradycardia. 7. Osteoarthritis. 8. Colonic polyps. 9. Diverticulosis.  PAST SURGICAL HISTORY: 1. Recent L3-L4, L4-L5 decompression, instrumentation and fusion on     May 12, 2010. 2. Left knee meniscus surgery. 3. Elbow surgery. 4. Tonsillectomy. 5. Prostatectomy. 6. Inguinal herniorrhaphy. 7. Right leg fracture surgery. 8. Colonoscopy and sigmoidoscopy in 1998. 9. Colonoscopy in 2010.  ALLERGIES:  No known drug allergies.  MEDICATIONS:  According to the Pharmacy Department, when they called his pharmacy, he seems to have filled some medications but not rest since January of 2010.  However, the patient indicates that he has been taking all his medications up to 3 days ago and also indicates that he does not use any other pharmacies. 1. Prednisone taper, unclear if he is still taking this or has     completed.  The prescription was filled on February 27 for 12 days     and should have been completed by now. 2. Piroxicam 20 mg p.o. daily. 3. Paxil 20 mg p.o. daily. 4. Nasonex 1 spray nasally daily. 5. Lisinopril/hydrochlorothiazide 20/12.5 one tablet p.o. daily. 6. Lipitor 20 mg daily p.o. 7. Levothroid 50 mcg p.o. daily. 8. Diazepam 5 mg p.o. q.8h. p.r.n. for muscle spasms. 9. BC arthritis powder 1 packet daily p.r.n. for pain. 10.Aspirin 81 mg p.o. daily. 11.Aleve 440 mg p.o. daily p.r.n. for pain. 12.Oxycodone/acetaminophen 10/325 one tablet p.o. q.6h. p.r.n. pain. 13.Tramadol 50 mg p.o. q.6h. p.r.n. pain. 14.Tylenol Precise pain relieving cream, 1 application  daily as     needed. 15.Vitamin E over-the-counter 1 capsule p.o. daily.  FAMILY HISTORY:  The patient's brother is alive and had multiple MIs. The patient's sister has diabetes.  The patient's younger brother had some "spine infection."  SOCIAL HISTORY:  The patient is single.  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  He is ambulant with the help of a cane and is independent of activities of daily living.  He denies current smoking and quit 20 years ago.  He does drink alcohol but the exact amount cannot be clearly ascertained.  He says he drinks a 6-pack for the week and also does hard liquor but is not willing to substantiate the amount.  His last alcohol intake was 2 days ago.  CODE STATUS:  His advanced directives, full code.  REVIEW OF SYSTEMS:  All systems reviewed and apart from history of presenting illness is pertinent for chronic low back pain.  He denies any dysuria or frequency.  According to the ED staff, the patient has received 3 liters of IV fluids and has had minimal urine output.  PHYSICAL EXAMINATION:  GENERAL:  Mr. Murin is a moderately built and nourished male patient who has intermittent discomfort from dry heaving but is not in any respiratory distress. VITAL SIGNS:  Temperature 98.3 degrees Fahrenheit, blood pressure 120/80 mmHg, pulse 74 per minute, respirations 20 per minute and saturating at 97% on room air. HEENT:  Head, eyes, ENT is nontraumatic, normocephalic.  Pupils equally reacting to light and accommodation.  Oral mucosa dry but no other acute findings. NECK:  Supple.  No JVD or carotid bruit. LYMPHATICS:  No lymphadenopathy. RESPIRATORY:  Clear to auscultation.  No increased workup breathing. CARDIOVASCULAR:  First and second heart sounds heard and regular.  No murmurs, rubs, gallops or clicks. ABDOMEN:  Nondistended.  Mild periumbilical tenderness but no rigidity, guarding or rebound.  Abdomen is soft with normal bowel sounds heard. No  organomegaly or mass appreciated. CENTRAL NERVOUS SYSTEM:  The patient is awake, alert, oriented x 3 with no focal neurological deficits. EXTREMITIES:  With grade 5/5 power.  No cyanosis, clubbing or edema. Peripheral pulses symmetrically felt. MUSCULOSKELETAL:  The patient has a lumbar midline surgical scar which is well-healed with no acute findings. SKIN:  Without any rashes.  LABORATORY DATA AND IMAGING: 1. CT of the abdomen and pelvis with contrast.  Impression, sigmoid     diverticulosis without evidence of diverticulitis.  No definite     acute intra-abdominal or intrapelvic process seen.  Postsurgical     changes of prostatectomy.  Lingular infiltrate and bibasilar     atelectasis. 2. Abdominal x-rays no evidence of bowel perforation or obstruction. 3. Chest x-ray, no acute cardiopulmonary process. 4. Lipase is 22.  Comprehensive metabolic panel only significant for     glucose of  134.  Albumin is 3.1.  CBC, hemoglobin 11, hematocrit     35, white blood cell 11.5, platelets 374,000.  ASSESSMENT AND PLAN: 1. Nausea, vomiting and abdominal pain.  Differential diagnosis is     acute possibly viral gastroenteritis versus gastritis secondary to     alcohol abuse, peptic ulcer disease, gastroesophageal reflux     disease.  Will admit the patient to a regular medical bed.  Will     provide IV fluids, IV antiemetics and proton pump inhibitors.  We     will try oral clear liquids as tolerated and monitor.  The patient does not have any typical symptoms suggestive of     pneumonia.  He is afebrile without significant leukocytosis.  The     CT of the abdomen suggests lingular infiltrate.  This dictator     called the radiologist who does indicate that there is an early     infiltrate and recommended obtaining a CT of the chest with     contrast to rule out possibility of pneumonia versus aspiration     versus an obstructing mass.  The patient does have history of     smoking.  The  patient has received Rocephin and Zithromax in the     ED.  Will hold these medications until we have further     clarification as to the diagnosis of pneumonia.  Will also check a     procalcitonin level. 2. Dehydration secondary to nausea, vomiting and poor oral intake.     Will hydrate with IV fluids and also provide multivitamins. 3. Alcohol use/possible abuse.  Will monitor for withdrawal using the     [facility] Withdrawal Assessment protocol.  We will provide     multivitamins and monitor. 4. Hypertension.  Currently, the patient is dehydrated and his blood     pressures are normal.  Will hold antihypertensive medications and     monitor. 5. Hypothyroidism.  Continue Synthroid.  His recent TSH was normal. 5. Low back pain status post recent spine surgery.  Will continue his     pain medications and monitor. 6. Full code status.  Time taken in coordinating this history and physical note was 1 hour 50 minutes.     Marcellus Scott, MD     AH/MEDQ  D:  05/30/2010  T:  05/30/2010  Job:  [id number]  cc:   Gordy Savers, MD [address] [place] [address]  Cristi Loron, M.D. Fax: [phone]  Electronically Signed by Marcellus Scott MD on 06/06/2010 12:16:19 AM

## 2010-06-06 NOTE — Progress Notes (Signed)
  Subjective:    Patient ID: [name], male    DOB: [birth year], 60 y.o.   MRN: [id number]  HPI  60 year old patient who is seen today following hospital discharge. He presented with nausea vomiting and diarrhea presumably secondary to a viral gastroenteritis. Hospital course was contacted by community-acquired pneumonia. His also diedwith bilateral pulmonary emboli and discharged on Coumadin. Since his discharge he has done well and denies any pulmonary complaints his GI symptoms have also resolved. He has treated hypothyroidism and hypertension.   Review of Systems  Constitutional: Negative for fever, chills, appetite change and fatigue.  HENT: Negative for hearing loss, ear pain, congestion, sore throat, trouble swallowing, neck stiffness, dental problem, voice change and tinnitus.   Eyes: Negative for pain, discharge and visual disturbance.  Respiratory: Negative for cough, chest tightness, wheezing and stridor.   Cardiovascular: Negative for chest pain, palpitations and leg swelling.  Gastrointestinal: Negative for nausea, vomiting, abdominal pain, diarrhea, constipation, blood in stool and abdominal distention.  Genitourinary: Negative for urgency, hematuria, flank pain, discharge, difficulty urinating and genital sores.  Musculoskeletal: Negative for myalgias, back pain, joint swelling, arthralgias and gait problem.  Skin: Negative for rash.  Neurological: Negative for dizziness, syncope, speech difficulty, weakness, numbness and headaches.  Hematological: Negative for adenopathy. Does not bruise/bleed easily.  Psychiatric/Behavioral: Negative for behavioral problems and dysphoric mood. The patient is not nervous/anxious.        Objective:   Physical Exam  Constitutional: He is oriented to person, place, and time. He appears well-developed.  HENT:  Head: Normocephalic.  Right Ear: External ear normal.  Left Ear: External ear normal.  Eyes: Conjunctivae and EOM are normal.    Neck: Normal range of motion.  Cardiovascular: Normal rate and normal heart sounds.   Pulmonary/Chest: Breath sounds normal.       O2 saturation 98%  Abdominal: Bowel sounds are normal.  Musculoskeletal: Normal range of motion. He exhibits no edema and no tenderness.  Neurological: He is alert and oriented to person, place, and time.  Psychiatric: He has a normal mood and affect. His behavior is normal.          Assessment & Plan:  Status post pulmonary embolism. We'll check a pro time today and continue Coumadin anticoagulation we'll recheck in 5 days Hypertension stable Status post community acquired  pneumonia Hypothyroidism

## 2010-06-09 NOTE — Discharge Summary (Signed)
NAMEALEXANDRO, LINE              ACCOUNT NO.:  [id number]  MEDICAL RECORD NO.:  [id number]           PATIENT TYPE:  I  LOCATION:  1440                         FACILITY:  [facility]  PHYSICIAN:  Peggye Pitt, M.D. DATE OF BIRTH:  06-Nov-1950  DATE OF ADMISSION:  05/29/2010 DATE OF DISCHARGE:  06/03/2010                              DISCHARGE SUMMARY   PRIMARY CARE PHYSICIAN:  Gordy Savers, MD  DISCHARGE DIAGNOSES: 1. Nausea, vomiting, and diarrhea, presumed viral gastroenteritis,     resolved. 2. Community-acquired pneumonia. 3. Bilateral pulmonary embolus. 5. Posttraumatic stress disorder. 6. Hypothyroidism. 7. Hypertension. 8. Depression. 9. History of CVA in the past. 10.History of prostate cancer.  DISCHARGE MEDICATIONS: 1. Lovenox 100 mg twice daily for 2 more days. 2. Lovenox 7.5 mg daily for 2 more days. 3. Folic acid 1 mg daily. 4. Avelox 400 mg daily until April 4th. 5. Multivitamin 1 tablet daily. 6. Thiamine 100 mg daily. 7. Aspirin 81 mg daily. 8. Diazepam 5 mg every 8 hours as needed for muscle spasm. 9. Synthroid 50 mcg daily. 10.Lipitor 20 mg daily. 11.Lisinopril/hydrochlorothiazide 20/12.5 mg daily. 12.Nasonex 1 spray daily. 13.Percocet 10/325 mg 1 tablet every 6 hours as needed for pain. 14.Paxil 20 mg daily. 15.Tramadol 50 mg every 6 hours as needed for pain. 16.Tylenol cream one application as needed. 17.Vitamin E over-the-counter 1 capsule daily .  DISPOSITION AND FOLLOWUP:  Mr. Downs will be discharged home today in stable and improved condition.  He has an appointment on Monday, April 2nd with his PCP, Dr. Amador Cunas for INR check.  At that time, he will be instructed on what dose of Coumadin to continue on.  CONSULTATIONS THIS HOSPITALIZATION:  None.  IMAGES AND PROCEDURES: 1. Acute abdominal series on March 26th that showed no acute     cardiopulmonary process with no bowel perforation or obstruction. 2. CT scan of the abdomen  and pelvis on March 26th that showed sigmoid     diverticulosis without evidence of diverticulitis, a lingular     infiltrate, and bibasilar atelectasis. 3. CT scan of the chest on March 26th that showed ground-glass opacity     in the lingula, suggesting acute pneumonia.  Right lower lobe and     left lower lobe pulmonary emboli are incidentally noted.  HISTORY AND PHYSICAL:  For complete details, please see dictation on March 26th by Dr. Waymon Amato, but in brief, Mr. Recker is a pleasant 60- year-old gentleman, initially admitted to our hospital on March 25th with complaints of nausea, vomiting, and abdominal pain.  Because of this, we were asked to admit him for further evaluation and management.  HOSPITAL COURSE BY PROBLEMS: 1. Nausea, vomiting, and diarrhea.  This was self-limited and has     resolved.  We believe that this was acute viral gastroenteritis.  C     difficile PCR has been negative. 2. Community-acquired pneumonia, which was discovered on CT scan.  He     was started on IV Rocephin and azithromycin and has ben     transitioned over to p.o. Avelox to complete a total of 10 days of  antibiotics, which will be on April 4th. 3. Incidental bilateral pulmonary emboli.  He has been on Coumadin and     Lovenox.  His INR is therapeutic today for the first time at 2.  He     will need to continue his Lovenox for 2 more days.  We have scheduled followup with Dr. Amador Cunas for INR check and further Coumadin dosing.  All the rest of his chronic medical conditions have been stable.  Vitals on the day of discharge:  Blood pressure 116/73, heart rate 57, respirations 18, sats 95% on room air, temperature 98.0.     Peggye Pitt, M.D.     EH/MEDQ  D:  06/03/2010  T:  06/04/2010  Job:  [id number]  cc:   Gordy Savers, MD [address] [place] [address]  Electronically Signed by Peggye Pitt M.D. on 06/09/2010 08:23:42 PM

## 2010-06-13 ENCOUNTER — Other Ambulatory Visit (INDEPENDENT_AMBULATORY_CARE_PROVIDER_SITE_OTHER): Payer: PRIVATE HEALTH INSURANCE | Admitting: Internal Medicine

## 2010-06-13 DIAGNOSIS — I2699 Other pulmonary embolism without acute cor pulmonale: Secondary | ICD-10-CM

## 2010-06-13 LAB — POCT INR: INR: 1.8

## 2010-06-13 NOTE — Patient Instructions (Signed)
Coumadin dose- 7.5mg . Monday Wednesday and Friday. All other days 5mg .

## 2010-06-20 ENCOUNTER — Ambulatory Visit (INDEPENDENT_AMBULATORY_CARE_PROVIDER_SITE_OTHER): Payer: PRIVATE HEALTH INSURANCE | Admitting: Internal Medicine

## 2010-06-20 DIAGNOSIS — I2699 Other pulmonary embolism without acute cor pulmonale: Secondary | ICD-10-CM

## 2010-06-20 DIAGNOSIS — Z0289 Encounter for other administrative examinations: Secondary | ICD-10-CM

## 2010-06-20 LAB — POCT INR: INR: 3.4

## 2010-06-20 NOTE — Patient Instructions (Signed)
7.5mg  on mondays and thursdays then 5 mg on other days, check in 2 weeks

## 2010-07-04 ENCOUNTER — Ambulatory Visit (INDEPENDENT_AMBULATORY_CARE_PROVIDER_SITE_OTHER): Payer: PRIVATE HEALTH INSURANCE | Admitting: Internal Medicine

## 2010-07-04 DIAGNOSIS — I2699 Other pulmonary embolism without acute cor pulmonale: Secondary | ICD-10-CM

## 2010-07-04 DIAGNOSIS — Z0289 Encounter for other administrative examinations: Secondary | ICD-10-CM

## 2010-07-04 NOTE — Patient Instructions (Signed)
Same dose 

## 2010-07-12 ENCOUNTER — Ambulatory Visit (INDEPENDENT_AMBULATORY_CARE_PROVIDER_SITE_OTHER): Payer: PRIVATE HEALTH INSURANCE | Admitting: Internal Medicine

## 2010-07-12 DIAGNOSIS — Z0289 Encounter for other administrative examinations: Secondary | ICD-10-CM

## 2010-07-12 DIAGNOSIS — I2699 Other pulmonary embolism without acute cor pulmonale: Secondary | ICD-10-CM

## 2010-07-12 LAB — POCT INR: INR: 1.3

## 2010-07-12 NOTE — Patient Instructions (Addendum)
Take 7.5mg  today this week only. Then take 7.5mg . Monday Wednesday and Friday and 5mg . All other days of the week. Check in 2 weeks

## 2010-07-19 NOTE — Assessment & Plan Note (Signed)
[name]                            [facility] OFFICE NOTE   [name], [name]                     MRN:          [id number]  DATE:07/10/2006                            DOB:          [birth year]    A 60 year old gentleman seen today for a wellness exam.  He has remote  history of prostate CA, approaching his 10-year anniversary of surgery  by Dr. Aldean Ast.  He has had a hernia repair in the past.  Main  problems include DJD.  He has had left knee and left elbow surgery.  He  has a history of depression, insomnia, and seasonal allergic rhinitis.  He has ED.   At the present time, he is on no medications.  Has been on Paxil and  Effexor in the past.   REVIEW OF SYSTEMS:  Main complaints are musculoskeletal.  He did have  what sounds like a screening sigmoidoscopy at the [facility] in 2001 or  2002.  He is followed by [facility] ortho.   SOCIAL HISTORY:  Lives with his brother.  Retired from the [employer].   FAMILY HISTORY:  Father died at 55 of MI.  Mother died in a motor  vehicle accident at 36.  Two brothers, 3 sisters.  One brother with  coronary artery disease.  One sister is status post CABG.   EXAMINATION:  Revealed a mildly overweight male, in no acute distress.  Blood pressure was 135 to 150 over 90 to 100.  Fundi, ears, nose and throat clear.  NECK:  No bruits.  CHEST:  Clear.  CARDIOVASCULAR:  Normal heart sounds.  No murmurs.  ABDOMEN:  Benign.  Mildly overweight.  Well-healed lower midline scar.  EXTERNAL GENITALIA:  Normal.  RECTAL EXAM:  Prostate absent.  Did have some external hemorrhoidal  tags.  Stool Hemoccult negative.  EXTREMITIES:  Revealed faint dorsalis pedis pulses.  Posterior tibial  pulses were full.  NEUROLOGIC:  Negative.   IMPRESSION:  1. Mild hypertension.  2. Mild dyslipidemia.  3. Family history of coronary artery disease.   DISPOSITION:  1. Patient will be placed on Benicar/hydrochlorothiazide.  2.  We will recheck in 3 weeks.  3. [facility]      [facility].  If not, this      will be set up here locally.  4. We will also set up for an exercise stress test.  He feels he may      be able to complete a treadmill evaluation.     Gordy Savers, MD  Electronically Signed    PFK/MedQ  DD: 07/10/2006  DT: 07/10/2006  Job #: [id number]

## 2010-08-04 ENCOUNTER — Ambulatory Visit: Payer: PRIVATE HEALTH INSURANCE

## 2010-08-04 LAB — POCT INR: INR: 1.2

## 2010-08-04 NOTE — Patient Instructions (Signed)
5 mg on mondays,wednesdays and fridays then 7.5 mg on other days, check in 3 weeks

## 2010-08-25 ENCOUNTER — Ambulatory Visit: Payer: PRIVATE HEALTH INSURANCE

## 2010-08-25 DIAGNOSIS — I2699 Other pulmonary embolism without acute cor pulmonale: Secondary | ICD-10-CM

## 2010-08-25 NOTE — Patient Instructions (Signed)
5 mg on mondays and thursdays then 7.5 mg on other days, check in 3 weeks

## 2010-09-15 ENCOUNTER — Other Ambulatory Visit (INDEPENDENT_AMBULATORY_CARE_PROVIDER_SITE_OTHER): Payer: PRIVATE HEALTH INSURANCE

## 2010-09-15 DIAGNOSIS — Z0000 Encounter for general adult medical examination without abnormal findings: Secondary | ICD-10-CM

## 2010-09-15 DIAGNOSIS — Z7901 Long term (current) use of anticoagulants: Secondary | ICD-10-CM

## 2010-09-15 DIAGNOSIS — I2699 Other pulmonary embolism without acute cor pulmonale: Secondary | ICD-10-CM

## 2010-09-15 LAB — POCT URINALYSIS DIPSTICK
Bilirubin, UA: NEGATIVE
Glucose, UA: NEGATIVE
Ketones, UA: NEGATIVE
Leukocytes, UA: NEGATIVE
Nitrite, UA: NEGATIVE
Protein, UA: NEGATIVE
Spec Grav, UA: 1.025
Urobilinogen, UA: 0.2
pH, UA: 5.5

## 2010-09-15 LAB — CBC WITH DIFFERENTIAL/PLATELET
Basophils Absolute: 0.1 10*3/uL (ref 0.0–0.1)
Basophils Relative: 0.8 % (ref 0.0–3.0)
Eosinophils Absolute: 0.1 10*3/uL (ref 0.0–0.7)
Eosinophils Relative: 1.5 % (ref 0.0–5.0)
HCT: 44.9 % (ref 39.0–52.0)
Hemoglobin: 15.6 g/dL (ref 13.0–17.0)
Lymphocytes Relative: 20.2 % (ref 12.0–46.0)
Lymphs Abs: 1.5 10*3/uL (ref 0.7–4.0)
MCHC: 34.7 g/dL (ref 30.0–36.0)
MCV: 90.2 fl (ref 78.0–100.0)
Monocytes Absolute: 0.6 10*3/uL (ref 0.1–1.0)
Monocytes Relative: 8.7 % (ref 3.0–12.0)
Neutro Abs: 5.1 10*3/uL (ref 1.4–7.7)
Neutrophils Relative %: 68.8 % (ref 43.0–77.0)
Platelets: 269 10*3/uL (ref 150.0–400.0)
RBC: 4.98 Mil/uL (ref 4.22–5.81)
RDW: 15.5 % — ABNORMAL HIGH (ref 11.5–14.6)
WBC: 7.4 10*3/uL (ref 4.5–10.5)

## 2010-09-15 LAB — LIPID PANEL
Cholesterol: 231 mg/dL — ABNORMAL HIGH (ref 0–200)
HDL: 45.3 mg/dL (ref >39.00)
Total CHOL/HDL Ratio: 5
Triglycerides: 134 mg/dL (ref 0.0–149.0)
VLDL: 26.8 mg/dL (ref 0.0–40.0)

## 2010-09-15 LAB — BASIC METABOLIC PANEL
BUN: 19 mg/dL (ref 6–23)
CO2: 24 mEq/L (ref 19–32)
Calcium: 9.7 mg/dL (ref 8.4–10.5)
Chloride: 102 mEq/L (ref 96–112)
Creatinine, Ser: 0.9 mg/dL (ref 0.4–1.5)
GFR: 95.89 mL/min (ref >60.00)
Glucose, Bld: 102 mg/dL — ABNORMAL HIGH (ref 70–99)
Potassium: 3.8 mEq/L (ref 3.5–5.1)
Sodium: 138 mEq/L (ref 135–145)

## 2010-09-15 LAB — HEPATIC FUNCTION PANEL
ALT: 25 U/L (ref 0–53)
AST: 26 U/L (ref 0–37)
Albumin: 4.9 g/dL (ref 3.5–5.2)
Alkaline Phosphatase: 77 U/L (ref 39–117)
Total Protein: 7.4 g/dL (ref 6.0–8.3)

## 2010-09-15 LAB — POCT INR: INR: 1.6

## 2010-09-15 NOTE — Patient Instructions (Signed)
7.5mg . Every day Check in 2 weeks

## 2010-09-22 ENCOUNTER — Encounter: Payer: Self-pay | Admitting: Internal Medicine

## 2010-09-22 ENCOUNTER — Ambulatory Visit (INDEPENDENT_AMBULATORY_CARE_PROVIDER_SITE_OTHER): Payer: PRIVATE HEALTH INSURANCE | Admitting: Internal Medicine

## 2010-09-22 VITALS — BP 110/80 | HR 70 | Temp 98.6°F | Resp 16 | Ht 69.0 in | Wt 222.0 lb

## 2010-09-22 DIAGNOSIS — Z0000 Encounter for general adult medical examination without abnormal findings: Secondary | ICD-10-CM

## 2010-09-22 MED ORDER — ATORVASTATIN CALCIUM 40 MG PO TABS: 40.0000 mg | ORAL_TABLET | Freq: Every day | ORAL | Status: DC

## 2010-09-22 NOTE — Patient Instructions (Addendum)
Increase Lipitor to 40 mg daily  Continue the Coumadin therapy  Recheck in 3 months  Limit your sodium (Salt) intake    It is important that you exercise regularly, at least 20 minutes 3 to 4 times per week.  If you develop chest pain or shortness of breath seek  medical attention.

## 2010-09-22 NOTE — Progress Notes (Signed)
Subjective:    Patient ID: [name], male    DOB: January 12, 1951, 60 y.o.   MRN: [id number]  HPI History of Present Illness:   60 year-old patient who is in today for an annual examination. His long history of hypertension, but has been off his antihypertensive medications for approximately 1 month. He has advanced arthritis. History depression, which has been stable off his medication has remote history of prostate cancer, and allergic rhinitis.  For the past, one or two months. He has had some difficulty with the use of his right hand. He states that he is had a difficult time writing legibly. He also describes some stiffness and a sense of his right leg being unsteady. For the past 3 months. He describes occasionally neck pain. Denies any bowel or bladder difficulty.  The patient was evaluated as an outpatient in June of last year for a lacunar stroke. The patient underwent a laminectomy in March of this year and postoperatively was admitted for pneumonia and readmitted for acute pulmonary embolism. He is now on Coumadin anticoagulation for 4 months  Preventive Screening-Counseling & Management  Alcohol-Tobacco  Smoking Status: never   Allergies (verified):  No Known Drug Allergies   Past History:  Past Medical History:  Allergic rhinitis  Prostate cancer, hx of  Depression  Osteoarthritis  Insomnia  ED  history of hypertension  Colonic polyps, hx of  Hypothyroidism  Hyperlipidemia Status post lacunar stroke June 2011 Status post acute pulmonary embolism March 2012  Past Surgical History:   Status post laminectomy March 2012 Prostatectomy 1998  Tonsillectomy  Knee surgery L  Elbow surgery  Inguinal herniorrhaphy  fracture right lower leg  colonoscopy or sigmoidoscopy in 1998  colonoscopy june 2010   Family History:  Reviewed history from 07/28/2008 and no changes required.  father died age 69, MI  mother died at 60 from a motor vehicle accident had a history  of asthma  two brothers 3 sisters-  positive for coronary artery disease (2)  One sister died of MI (90)   Social History:  Reviewed history from 04/30/2007 and no changes required.  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  HENT: Negative for hearing loss, ear pain, congestion, rhinorrhea, sneezing, mouth sores, trouble swallowing, neck pain, neck stiffness, dental problem, voice change, sinus pressure and tinnitus.   Eyes: Negative for photophobia, pain, redness and visual disturbance.  Respiratory: Negative for apnea, cough, choking, chest tightness, shortness of breath and wheezing.   Cardiovascular: Negative for chest pain, palpitations and leg swelling.  Gastrointestinal: Negative for nausea, vomiting, abdominal pain, diarrhea, constipation, blood in stool, abdominal distention, anal bleeding and rectal pain.  Genitourinary: Negative for dysuria, urgency, frequency, hematuria, flank pain, decreased urine volume, discharge, penile swelling, scrotal swelling, difficulty urinating, genital sores and testicular pain.  Musculoskeletal: Positive for back pain and arthralgias. Negative for myalgias, joint swelling and gait problem.  Skin: Negative for color change, rash and wound.  Neurological: Negative for dizziness, tremors, seizures, syncope, facial asymmetry, speech difficulty, weakness, light-headedness, numbness and headaches.  Hematological: Negative for adenopathy. Does not bruise/bleed easily.  Psychiatric/Behavioral: Negative for suicidal ideas, hallucinations, behavioral problems, confusion, sleep disturbance, self-injury, dysphoric mood, decreased concentration and agitation. The patient is not nervous/anxious.        Objective:   Physical Exam  Constitutional: He appears well-developed and well-nourished.  HENT:  Head: Normocephalic and  atraumatic.   Right Ear: External ear normal.  Left Ear: External ear normal.  Nose: Nose normal.  Mouth/Throat: Oropharynx is clear and moist.  Eyes: Conjunctivae and EOM are normal. Pupils are equal, round, and reactive to light. No scleral icterus.  Neck: Normal range of motion. Neck supple. No JVD present. No thyromegaly present.  Cardiovascular: Regular rhythm, normal heart sounds and intact distal pulses.  Exam reveals no gallop and no friction rub.   No murmur heard. Pulmonary/Chest: Effort normal and breath sounds normal. He exhibits no tenderness.  Abdominal: Soft. Bowel sounds are normal. He exhibits no distension and no mass. There is no tenderness.  Genitourinary: Penis normal.       Uncircumcised  Musculoskeletal: Normal range of motion. He exhibits no edema and no tenderness.       Left knee with some hypertrophic changes and slightly warm to touch  Lymphadenopathy:    He has no cervical adenopathy.  Neurological: He is alert. He has normal reflexes. No cranial nerve deficit. Coordination normal.  Skin: Skin is warm and dry. No rash noted.  Psychiatric: He has a normal mood and affect. His behavior is normal.          Assessment & Plan:   Annual clinical exam Status post pulmonary embolism 4 months ago Cerebrovascular disease status post lacunar stroke June 2011 Dyslipidemia. We'll increase his Lipitor to 40 mg daily  Recheck 3 months. Followup lipid panel at that time Continue monthly prothrombin checks

## 2010-10-06 ENCOUNTER — Ambulatory Visit: Payer: PRIVATE HEALTH INSURANCE

## 2010-10-10 ENCOUNTER — Ambulatory Visit: Payer: PRIVATE HEALTH INSURANCE

## 2010-10-11 ENCOUNTER — Ambulatory Visit: Payer: PRIVATE HEALTH INSURANCE

## 2010-10-11 DIAGNOSIS — I2699 Other pulmonary embolism without acute cor pulmonale: Secondary | ICD-10-CM

## 2010-10-11 NOTE — Patient Instructions (Signed)
Same dose,7.5mg . Every day,check in 4 weeks By Dr.Jenkins

## 2010-10-27 ENCOUNTER — Other Ambulatory Visit: Payer: Self-pay

## 2010-10-27 MED ORDER — LISINOPRIL-HYDROCHLOROTHIAZIDE 20-12.5 MG PO TABS: 1.0000 | ORAL_TABLET | Freq: Every day | ORAL | Status: DC

## 2010-10-27 NOTE — Telephone Encounter (Signed)
Refill done.  

## 2010-11-08 ENCOUNTER — Ambulatory Visit: Payer: PRIVATE HEALTH INSURANCE

## 2010-11-09 ENCOUNTER — Ambulatory Visit (INDEPENDENT_AMBULATORY_CARE_PROVIDER_SITE_OTHER): Payer: PRIVATE HEALTH INSURANCE

## 2010-11-09 DIAGNOSIS — I2699 Other pulmonary embolism without acute cor pulmonale: Secondary | ICD-10-CM

## 2010-11-09 LAB — POCT INR: INR: 1.6

## 2010-11-09 NOTE — Patient Instructions (Signed)
Take 10 mg on mondays and thursdays 7.5mg . other days,check in 2 weeks

## 2010-11-23 ENCOUNTER — Ambulatory Visit (INDEPENDENT_AMBULATORY_CARE_PROVIDER_SITE_OTHER): Payer: PRIVATE HEALTH INSURANCE | Admitting: Internal Medicine

## 2010-11-23 DIAGNOSIS — I2699 Other pulmonary embolism without acute cor pulmonale: Secondary | ICD-10-CM

## 2010-11-23 LAB — POCT INR: INR: 2.4

## 2010-11-23 NOTE — Patient Instructions (Signed)
Take 10 mg on mondays and thursdays 7.5mg . other days,check in4 weeks

## 2010-12-22 ENCOUNTER — Ambulatory Visit (INDEPENDENT_AMBULATORY_CARE_PROVIDER_SITE_OTHER): Payer: PRIVATE HEALTH INSURANCE | Admitting: Internal Medicine

## 2010-12-22 ENCOUNTER — Encounter: Payer: Self-pay | Admitting: Internal Medicine

## 2010-12-22 DIAGNOSIS — M1990 Unspecified osteoarthritis, unspecified site: Secondary | ICD-10-CM

## 2010-12-22 DIAGNOSIS — I2699 Other pulmonary embolism without acute cor pulmonale: Secondary | ICD-10-CM

## 2010-12-22 DIAGNOSIS — I10 Essential (primary) hypertension: Secondary | ICD-10-CM

## 2010-12-22 LAB — POCT INR: INR: 2.6

## 2010-12-22 NOTE — Patient Instructions (Signed)
  Latest dosing instructions   Total Sun Mon Tue Wed Thu Fri Sat   57.5 7.5 mg 10 mg 7.5 mg 7.5 mg 10 mg 7.5 mg 7.5 mg    (5 mg1.5) (5 mg2) (5 mg1.5) (5 mg1.5) (5 mg2) (5 mg1.5) (5 mg1.5)

## 2010-12-22 NOTE — Progress Notes (Signed)
  Subjective:    Patient ID: [name], male    DOB: [birth year], 60 y.o.   MRN: [id number]  HPI  60 year old patient  who has a history of a pulmonary embolism following orthopedic surgery in March of this year. He has been on chronic Coumadin anticoagulation for greater than 6 months. He is doing quite well. He has treated hypertension osteoarthritis and dyslipidemia. He is doing quite well and his only complaint is knee pain.   Review of Systems  Constitutional: Negative for fever, chills, appetite change and fatigue.  HENT: Negative for hearing loss, ear pain, congestion, sore throat, trouble swallowing, neck stiffness, dental problem, voice change and tinnitus.   Eyes: Negative for pain, discharge and visual disturbance.  Respiratory: Negative for cough, chest tightness, wheezing and stridor.   Cardiovascular: Negative for chest pain, palpitations and leg swelling.  Gastrointestinal: Negative for nausea, vomiting, abdominal pain, diarrhea, constipation, blood in stool and abdominal distention.  Genitourinary: Negative for urgency, hematuria, flank pain, discharge, difficulty urinating and genital sores.  Musculoskeletal: Positive for arthralgias and gait problem. Negative for myalgias, back pain and joint swelling.  Skin: Negative for rash.  Neurological: Negative for dizziness, syncope, speech difficulty, weakness, numbness and headaches.  Hematological: Negative for adenopathy. Does not bruise/bleed easily.  Psychiatric/Behavioral: Negative for behavioral problems and dysphoric mood. The patient is not nervous/anxious.        Objective:   Physical Exam  Constitutional: He is oriented to person, place, and time. He appears well-developed.  HENT:  Head: Normocephalic.  Right Ear: External ear normal.  Left Ear: External ear normal.  Eyes: Conjunctivae and EOM are normal.  Neck: Normal range of motion.  Cardiovascular: Normal rate and normal heart sounds.   Pulmonary/Chest:  Breath sounds normal.  Abdominal: Bowel sounds are normal.  Musculoskeletal: Normal range of motion. He exhibits no edema and no tenderness.  Neurological: He is alert and oriented to person, place, and time.  Psychiatric: He has a normal mood and affect. His behavior is normal.          Assessment & Plan:    Status post pulmonary embolism. We'll discontinue Coumadin therapy it has been in excess of 6 months Hypertension well controlled. We'll continue present regimen Dyslipidemia stable History cerebral vascular disease. We'll resume daily aspirin

## 2011-03-07 DIAGNOSIS — I639 Cerebral infarction, unspecified: Secondary | ICD-10-CM

## 2011-03-07 HISTORY — DX: Cerebral infarction, unspecified: I63.9

## 2011-06-05 ENCOUNTER — Ambulatory Visit (INDEPENDENT_AMBULATORY_CARE_PROVIDER_SITE_OTHER): Payer: BC Managed Care – PPO | Admitting: Internal Medicine

## 2011-06-05 ENCOUNTER — Encounter: Payer: Self-pay | Admitting: Internal Medicine

## 2011-06-05 VITALS — BP 140/90 | Temp 98.8°F | Wt 236.0 lb

## 2011-06-05 DIAGNOSIS — I10 Essential (primary) hypertension: Secondary | ICD-10-CM

## 2011-06-05 DIAGNOSIS — R748 Abnormal levels of other serum enzymes: Secondary | ICD-10-CM

## 2011-06-05 DIAGNOSIS — M1990 Unspecified osteoarthritis, unspecified site: Secondary | ICD-10-CM

## 2011-06-05 LAB — CBC WITH DIFFERENTIAL/PLATELET
Basophils Absolute: 0 10*3/uL (ref 0.0–0.1)
Eosinophils Relative: 3.3 % (ref 0.0–5.0)
Hemoglobin: 14 g/dL (ref 13.0–17.0)
Lymphocytes Relative: 23.3 % (ref 12.0–46.0)
Monocytes Relative: 10.8 % (ref 3.0–12.0)
Platelets: 247 10*3/uL (ref 150.0–400.0)
RDW: 15.2 % — ABNORMAL HIGH (ref 11.5–14.6)
WBC: 8.2 10*3/uL (ref 4.5–10.5)

## 2011-06-05 LAB — AMYLASE: Amylase: 74 U/L (ref 27–131)

## 2011-06-05 LAB — COMPREHENSIVE METABOLIC PANEL
AST: 75 U/L — ABNORMAL HIGH (ref 0–37)
Albumin: 4.3 g/dL (ref 3.5–5.2)
Alkaline Phosphatase: 72 U/L (ref 39–117)
BUN: 14 mg/dL (ref 6–23)
Potassium: 4.1 mEq/L (ref 3.5–5.1)
Sodium: 142 mEq/L (ref 135–145)

## 2011-06-05 NOTE — Progress Notes (Signed)
  Subjective:    Patient ID: [name], male    DOB: August 05, 1950, 61 y.o.   MRN: [id number]  HPI  61 year old patient whose past medical history is pertinent for a history of hypertension and osteoarthritis. Last weekend he was seen at a urgent care at [facility] due to abdominal pain and nausea. He had some associated fever and chills. There was some suspicion for either acute cholecystitis or pancreatitis. He was placed on Cipro. He remains on antibiotic therapy and today was seen in followup. He feels much improved is still having some weakness and fatigue denies any recent fever or chills. He states that he noted dark urine for a couple of days early last week He had abdominal ultrasound performed at [facility] earlier today that was negative. Pancreas was not visualized due to abundance of bowel gas. No gallstones were identified there is no gallbladder wall thickening or peri-cholecystic fluid there is no sonographic Murphy's sign the common bile duct was normal both kidneys were normal without hydronephrosis.   Review of Systems  Constitutional: Positive for fatigue. Negative for fever, chills and appetite change.  HENT: Negative for hearing loss, ear pain, congestion, sore throat, trouble swallowing, neck stiffness, dental problem, voice change and tinnitus.   Eyes: Negative for pain, discharge and visual disturbance.  Respiratory: Negative for cough, chest tightness, wheezing and stridor.   Cardiovascular: Negative for chest pain, palpitations and leg swelling.  Gastrointestinal: Positive for nausea and abdominal pain. Negative for vomiting, diarrhea, constipation, blood in stool and abdominal distention.  Genitourinary: Negative for urgency, hematuria, flank pain, discharge, difficulty urinating and genital sores.  Musculoskeletal: Negative for myalgias, back pain, joint swelling, arthralgias and gait problem.  Skin: Negative for rash.  Neurological: Negative for  dizziness, syncope, speech difficulty, weakness, numbness and headaches.  Hematological: Negative for adenopathy. Does not bruise/bleed easily.  Psychiatric/Behavioral: Negative for behavioral problems and dysphoric mood. The patient is not nervous/anxious.        Objective:   Physical Exam  Constitutional: He is oriented to person, place, and time. He appears well-developed and well-nourished. No distress.       Overweight. No acute distress. Temperature 98.8  HENT:  Head: Normocephalic.  Right Ear: External ear normal.  Left Ear: External ear normal.  Eyes: Conjunctivae and EOM are normal.  Neck: Normal range of motion.  Cardiovascular: Normal rate and normal heart sounds.   Pulmonary/Chest: Breath sounds normal.  Abdominal: Soft. Bowel sounds are normal. There is tenderness.       Mild epigastric and right upper quadrant tenderness. Negative Murphy's sign bowel sounds active  Musculoskeletal: Normal range of motion. He exhibits no edema and no tenderness.  Neurological: He is alert and oriented to person, place, and time.  Psychiatric: He has a normal mood and affect. His behavior is normal.          Assessment & Plan:   Reason history of right upper quadrant and epigastric pain presumably associated with elevated liver function studies. Possibly a passed stone. Clinically the patient looks well at the present time and no gallstones or gallbladder wall thickening noted today. Hypertension stable Osteoarthritis  We'll followup on lab including CBC LFTs and amylase. We'll clinically observe

## 2011-06-05 NOTE — Patient Instructions (Signed)
Limit your sodium (Salt) intake Call or return to clinic prn if these symptoms worsen or fail to improve as anticipated.   Return in 4 months for follow-up  

## 2011-06-19 ENCOUNTER — Other Ambulatory Visit: Payer: Self-pay

## 2011-06-19 MED ORDER — TRAMADOL HCL 50 MG PO TABS: 50.0000 mg | ORAL_TABLET | Freq: Four times a day (QID) | ORAL | Status: DC | PRN

## 2011-06-22 ENCOUNTER — Encounter: Payer: Self-pay | Admitting: Internal Medicine

## 2011-06-22 ENCOUNTER — Ambulatory Visit (INDEPENDENT_AMBULATORY_CARE_PROVIDER_SITE_OTHER): Payer: BC Managed Care – PPO | Admitting: Internal Medicine

## 2011-06-22 VITALS — BP 122/80 | Temp 98.3°F | Wt 242.0 lb

## 2011-06-22 DIAGNOSIS — M1990 Unspecified osteoarthritis, unspecified site: Secondary | ICD-10-CM

## 2011-06-22 DIAGNOSIS — E039 Hypothyroidism, unspecified: Secondary | ICD-10-CM

## 2011-06-22 DIAGNOSIS — J309 Allergic rhinitis, unspecified: Secondary | ICD-10-CM

## 2011-06-22 DIAGNOSIS — I10 Essential (primary) hypertension: Secondary | ICD-10-CM

## 2011-06-22 MED ORDER — MOMETASONE FUROATE 50 MCG/ACT NA SUSP: 2.0000 | Freq: Every day | NASAL | Status: DC

## 2011-06-22 MED ORDER — LISINOPRIL-HYDROCHLOROTHIAZIDE 20-12.5 MG PO TABS: 1.0000 | ORAL_TABLET | Freq: Every day | ORAL | Status: DC

## 2011-06-22 MED ORDER — LEVOTHYROXINE SODIUM 50 MCG PO TABS: 50.0000 ug | ORAL_TABLET | Freq: Every day | ORAL | Status: DC

## 2011-06-22 MED ORDER — TRAMADOL HCL 50 MG PO TABS: 50.0000 mg | ORAL_TABLET | Freq: Four times a day (QID) | ORAL | Status: DC | PRN

## 2011-06-22 MED ORDER — PAROXETINE HCL 20 MG PO TABS: 20.0000 mg | ORAL_TABLET | ORAL | Status: DC

## 2011-06-22 MED ORDER — ATORVASTATIN CALCIUM 40 MG PO TABS: 40.0000 mg | ORAL_TABLET | Freq: Every day | ORAL | Status: DC

## 2011-06-22 NOTE — Progress Notes (Signed)
  Subjective:    Patient ID: [name], male    DOB: [birth year], 62 y.o.   MRN: [id number]  HPI  62 year old patient who is in today for followup. He has treated hypertension and has done well. He has some reflux symptoms today following meals but in general doing well. He does have a history of allergic rhinitis which has been stable does have a history of mild depression which has been very well controlled on the proximal obtained.   Review of Systems  Constitutional: Negative for fever, chills, appetite change and fatigue.  HENT: Negative for hearing loss, ear pain, congestion, sore throat, trouble swallowing, neck stiffness, dental problem, voice change and tinnitus.   Eyes: Negative for pain, discharge and visual disturbance.  Respiratory: Negative for cough, chest tightness, wheezing and stridor.   Cardiovascular: Negative for chest pain, palpitations and leg swelling.  Gastrointestinal: Negative for nausea, vomiting, abdominal pain, diarrhea, constipation, blood in stool and abdominal distention.  Genitourinary: Negative for urgency, hematuria, flank pain, discharge, difficulty urinating and genital sores.  Musculoskeletal: Positive for back pain and arthralgias. Negative for myalgias, joint swelling and gait problem.  Skin: Negative for rash.  Neurological: Negative for dizziness, syncope, speech difficulty, weakness, numbness and headaches.  Hematological: Negative for adenopathy. Does not bruise/bleed easily.  Psychiatric/Behavioral: Negative for behavioral problems and dysphoric mood. The patient is not nervous/anxious.        Objective:   Physical Exam  Constitutional: He is oriented to person, place, and time. He appears well-developed.  HENT:  Head: Normocephalic.  Right Ear: External ear normal.  Left Ear: External ear normal.  Eyes: Conjunctivae and EOM are normal.  Neck: Normal range of motion.  Cardiovascular: Normal rate and normal heart sounds.     Pulmonary/Chest: Breath sounds normal.  Abdominal: Bowel sounds are normal.  Musculoskeletal: Normal range of motion. He exhibits no edema and no tenderness.  Neurological: He is alert and oriented to person, place, and time.  Psychiatric: He has a normal mood and affect. His behavior is normal.          Assessment & Plan:   Hypertension well controlled Mild reflux. Anti-reflux regimen discussed we'll treat with short term PPI depression stable  Osteoarthritis  Recheck 6 months

## 2011-06-22 NOTE — Patient Instructions (Addendum)
Avoids foods high in acid such as tomatoes citrus juices, and spicy foods.  Avoid eating within two hours of lying down or before exercising.  Do not overheat.  Try smaller more frequent meals.  If symptoms persist, elevate the head of her bed 12 inches while sleeping.  Limit your sodium (Salt) intake    It is important that you exercise regularly, at least 20 minutes 3 to 4 times per week.  If you develop chest pain or shortness of breath seek  medical attention.Gastroesophageal Reflux Disease, Adult Gastroesophageal reflux disease (GERD) happens when acid from your stomach goes into your food pipe (esophagus). The acid can cause a burning feeling in your chest. Over time, the acid can make small holes (ulcers) in your food pipe.   HOME CARE  Ask your doctor for advice about:   Losing weight.   Quitting smoking.   Alcohol use.   Avoid foods and drinks that make your problems worse. You may want to avoid:   Caffeine and alcohol.   Chocolate.   Mints.   Garlic and onions.   Spicy foods.   Citrus fruits, such as oranges, lemons, or limes.   Foods that contain tomato, such as sauce, chili, salsa, and pizza.   Fried and fatty foods.   Avoid lying down for 3 hours before you go to bed or before you take a nap.   Eat small meals often, instead of large meals.   Wear loose-fitting clothing. Do not wear anything tight around your waist.   Raise (elevate) the head of your bed 6 to 8 inches with wood blocks. Using extra pillows does not help.   Only take medicines as told by your doctor.   Do not take aspirin or ibuprofen.  GET HELP RIGHT AWAY IF:    You have pain in your arms, neck, jaw, teeth, or back.   Your pain gets worse or changes.   You feel sick to your stomach (nauseous), throw up (vomit), or sweat (diaphoresis).   You feel short of breath, or you pass out (faint).   Your throw up is green, yellow, black, or looks like coffee grounds or blood.   Your poop  (stool) is red, bloody, or black.  MAKE SURE YOU:    Understand these instructions.   Will watch your condition.   Will get help right away if you are not doing well or get worse.  Document Released: 08/09/2007 Document Revised: 02/09/2011 Document Reviewed: 09/09/2010 [facility] Patient Information 2012 [place], [place].Diet for GERD or PUD Nutrition therapy can help ease the discomfort of gastroesophageal reflux disease (GERD) and peptic ulcer disease (PUD).   HOME CARE INSTRUCTIONS    Eat your meals slowly, in a relaxed setting.   Eat 5 to 6 small meals per day.   If a food causes distress, stop eating it for a period of time.  FOODS TO AVOID  Coffee, regular or decaffeinated.   Cola beverages, regular or low calorie.   Tea, regular or decaffeinated.   Pepper.   Cocoa.   High fat foods, including meats.   Butter, margarine, hydrogenated oil (trans fats).   Peppermint or spearmint (if you have GERD).   Fruits and vegetables if not tolerated.   Alcohol.   Nicotine (smoking or chewing). This is one of the most potent stimulants to acid production in the gastrointestinal tract.   Any food that seems to aggravate your condition.  If you have questions regarding your diet, ask your caregiver or  a Museum/gallery exhibitions officer. TIPS  Lying flat may make symptoms worse. Keep the head of your bed raised 6 to 9 inches (15 to 23 cm) by using a foam wedge or blocks under the legs of the bed.   Do not lay down until 3 hours after eating a meal.   Daily physical activity may help reduce symptoms.  MAKE SURE YOU:    Understand these instructions.   Will watch your condition.   Will get help right away if you are not doing well or get worse.  Document Released: 02/20/2005 Document Revised: 02/09/2011 Document Reviewed: 01/06/2011 [facility] Patient Information 2012 [place], [place].

## 2011-06-26 ENCOUNTER — Other Ambulatory Visit: Payer: Self-pay

## 2011-06-26 NOTE — Telephone Encounter (Signed)
#  [phone] [id number] 

## 2011-06-26 NOTE — Telephone Encounter (Signed)
Fax refill request from [facility] for piroxicam 20mg  - this was dc'd from med list awhile back - never filled in the EMR system that I can find Please advise - last seen 06/22/11- 6 mos rov

## 2011-06-27 MED ORDER — PIROXICAM 20 MG PO CAPS: 20.0000 mg | ORAL_CAPSULE | Freq: Every day | ORAL | Status: AC

## 2011-09-25 ENCOUNTER — Ambulatory Visit: Payer: Self-pay | Admitting: Family

## 2011-09-25 DIAGNOSIS — I2699 Other pulmonary embolism without acute cor pulmonale: Secondary | ICD-10-CM

## 2011-09-25 NOTE — Patient Instructions (Signed)
Therapy discontinued by Dr. Kirtland Bouchard on 10/18/ 2013

## 2011-12-22 ENCOUNTER — Ambulatory Visit: Payer: BC Managed Care – PPO | Admitting: Internal Medicine

## 2012-01-10 ENCOUNTER — Ambulatory Visit (INDEPENDENT_AMBULATORY_CARE_PROVIDER_SITE_OTHER): Payer: BC Managed Care – PPO | Admitting: Internal Medicine

## 2012-01-10 ENCOUNTER — Encounter: Payer: Self-pay | Admitting: Internal Medicine

## 2012-01-10 VITALS — BP 130/90 | Wt 245.0 lb

## 2012-01-10 DIAGNOSIS — I10 Essential (primary) hypertension: Secondary | ICD-10-CM

## 2012-01-10 DIAGNOSIS — M1990 Unspecified osteoarthritis, unspecified site: Secondary | ICD-10-CM

## 2012-01-10 DIAGNOSIS — Z23 Encounter for immunization: Secondary | ICD-10-CM

## 2012-01-10 NOTE — Patient Instructions (Signed)
Limit your sodium (Salt) intake  Please check your blood pressure on a regular basis.  If it is consistently greater than 150/90, please make an office appointment.  You need to lose weight.  Consider a lower calorie diet and regular exercise. 

## 2012-01-10 NOTE — Progress Notes (Signed)
Subjective:    Patient ID: [name], male    DOB: Dec 28, 1950, 61 y.o.   MRN: [id number]  HPI  61 year old patient who is seen today for followup. He has a history of treated hypertension as well as end-stage arthritis involving the left knee. He is followed at [facility] and is considering another surgery soon. 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 He walks with a chronic limp and is in chronic pain  Past Medical History  Diagnosis Date  . Allergy   . Depression   . Prostate cancer   . Osteoarthritis   . Insomnia   . Erectile dysfunction   . Hypertension   . Hx of colonic polyps   . Hypothyroidism   . Stroke, lacunar     History   Social History  . Marital Status: Single    Spouse Name: N/A    Number of Children: N/A  . Years of Education: N/A   Occupational History  . Not on file.   Social History Main Topics  . Smoking status: Never Smoker   . Smokeless tobacco: Current User    Types: Chew  . Alcohol Use: 1.0 oz/week    2 drink(s) per week  . Drug Use: No  . Sexually Active: Not on file   Other Topics Concern  . Not on file   Social History Narrative  . No narrative on file    Past Surgical History  Procedure Date  . Prostatectomy   . Tonsillectomy   . Knee surgery   . Elbow surgery   . Tibia fracture surgery   . Hernia repair     Family History  Problem Relation Age of Onset  . Asthma Mother   . Heart attack Father   . Heart attack Sister   . Coronary artery disease      fhx    No Known Allergies  Current Outpatient Prescriptions on File Prior to Visit  Medication Sig Dispense Refill  . aspirin 81 MG tablet Take 81 mg by mouth daily.        Marland Kitchen atorvastatin (LIPITOR) 40 MG tablet Take 1 tablet (40 mg total) by mouth daily.  90 tablet  4  . ciprofloxacin (CIPRO) 500 MG tablet Take 500 mg by mouth 2 (two) times daily.      . folic acid (FOLVITE) 1 MG tablet Take 1 mg by mouth daily.        Marland Kitchen  levothyroxine (SYNTHROID, LEVOTHROID) 50 MCG tablet Take 1 tablet (50 mcg total) by mouth daily.  90 tablet  6  . lisinopril-hydrochlorothiazide (PRINZIDE,ZESTORETIC) 20-12.5 MG per tablet Take 1 tablet by mouth daily.  90 tablet  3  . mometasone (NASONEX) 50 MCG/ACT nasal spray Place 2 sprays into the nose daily.  17 g  6  . Multiple Vitamin (MULTIVITAMIN) tablet Take 1 tablet by mouth daily.        Marland Kitchen PARoxetine (PAXIL) 20 MG tablet Take 1 tablet (20 mg total) by mouth every morning.  90 tablet  6  . piroxicam (FELDENE) 20 MG capsule Take 1 capsule (20 mg total) by mouth daily.  60 capsule  4  . thiamine 100 MG tablet Take 100 mg by mouth daily.          BP 130/90  Wt 245 lb (111.131 kg)     Review of Systems  Constitutional: Negative for fever, chills, appetite change and fatigue.  HENT: Negative for hearing loss, ear  pain, congestion, sore throat, trouble swallowing, neck stiffness, dental problem, voice change and tinnitus.   Eyes: Negative for pain, discharge and visual disturbance.  Respiratory: Negative for cough, chest tightness, wheezing and stridor.   Cardiovascular: Negative for chest pain, palpitations and leg swelling.  Gastrointestinal: Negative for nausea, vomiting, abdominal pain, diarrhea, constipation, blood in stool and abdominal distention.  Genitourinary: Negative for urgency, hematuria, flank pain, discharge, difficulty urinating and genital sores.  Musculoskeletal: Positive for arthralgias and gait problem. Negative for myalgias, back pain and joint swelling.       Chronic left knee pain  Skin: Negative for rash.  Neurological: Negative for dizziness, syncope, speech difficulty, weakness, numbness and headaches.  Hematological: Negative for adenopathy. Does not bruise/bleed easily.  Psychiatric/Behavioral: Negative for behavioral problems and dysphoric mood. The patient is not nervous/anxious.        Objective:   Physical Exam  Constitutional: He is oriented  to person, place, and time. He appears well-developed.       130/88  HENT:  Head: Normocephalic.  Right Ear: External ear normal.  Left Ear: External ear normal.  Eyes: Conjunctivae normal and EOM are normal.  Neck: Normal range of motion.  Cardiovascular: Normal rate and normal heart sounds.   Pulmonary/Chest: Breath sounds normal.  Abdominal: Bowel sounds are normal.  Musculoskeletal: Normal range of motion. He exhibits no edema and no tenderness.  Neurological: He is alert and oriented to person, place, and time.  Psychiatric: He has a normal mood and affect. His behavior is normal.          Assessment & Plan:   HTN- stable DJD- f/u [facility]  cpx 6 MON

## 2012-07-02 ENCOUNTER — Telehealth: Payer: Self-pay | Admitting: Internal Medicine

## 2012-07-02 ENCOUNTER — Other Ambulatory Visit (INDEPENDENT_AMBULATORY_CARE_PROVIDER_SITE_OTHER): Payer: BC Managed Care – PPO

## 2012-07-02 DIAGNOSIS — Z0000 Encounter for general adult medical examination without abnormal findings: Secondary | ICD-10-CM

## 2012-07-02 LAB — HEPATIC FUNCTION PANEL
Albumin: 4.6 g/dL (ref 3.5–5.2)
Alkaline Phosphatase: 71 U/L (ref 39–117)
Bilirubin, Direct: 0.1 mg/dL (ref 0.0–0.3)
Total Protein: 7.1 g/dL (ref 6.0–8.3)

## 2012-07-02 LAB — LIPID PANEL
Cholesterol: 183 mg/dL (ref 0–200)
VLDL: 31.4 mg/dL (ref 0.0–40.0)

## 2012-07-02 LAB — POCT URINALYSIS DIPSTICK
Glucose, UA: NEGATIVE
Leukocytes, UA: NEGATIVE
Protein, UA: NEGATIVE
Spec Grav, UA: 1.02

## 2012-07-02 LAB — CBC WITH DIFFERENTIAL/PLATELET
Basophils Relative: 0.7 % (ref 0.0–3.0)
Eosinophils Absolute: 0.2 10*3/uL (ref 0.0–0.7)
Eosinophils Relative: 2 % (ref 0.0–5.0)
HCT: 42.3 % (ref 39.0–52.0)
Lymphs Abs: 1.8 10*3/uL (ref 0.7–4.0)
MCHC: 35.3 g/dL (ref 30.0–36.0)
MCV: 91.6 fl (ref 78.0–100.0)
Monocytes Absolute: 0.8 10*3/uL (ref 0.1–1.0)
Neutro Abs: 5.5 10*3/uL (ref 1.4–7.7)
Neutrophils Relative %: 66.3 % (ref 43.0–77.0)
RBC: 4.62 Mil/uL (ref 4.22–5.81)
WBC: 8.4 10*3/uL (ref 4.5–10.5)

## 2012-07-02 LAB — PSA: PSA: 0 ng/mL — ABNORMAL LOW (ref 0.10–4.00)

## 2012-07-02 LAB — BASIC METABOLIC PANEL
BUN: 20 mg/dL (ref 6–23)
Calcium: 9.6 mg/dL (ref 8.4–10.5)
Creatinine, Ser: 0.9 mg/dL (ref 0.4–1.5)
GFR: 87.1 mL/min (ref >60.00)
Glucose, Bld: 100 mg/dL — ABNORMAL HIGH (ref 70–99)

## 2012-07-02 LAB — TSH: TSH: 7.72 u[IU]/mL — ABNORMAL HIGH (ref 0.35–5.50)

## 2012-07-02 MED ORDER — MELOXICAM 15 MG PO TABS: 15.0000 mg | ORAL_TABLET | Freq: Every day | ORAL | Status: DC

## 2012-07-02 NOTE — Telephone Encounter (Signed)
#  [phone]  [phone] 

## 2012-07-02 NOTE — Telephone Encounter (Signed)
Patient would like to know can you refill his Meloxicam 15 mg?  Dr. Kirtland Bouchard did not prescribe it, [facility]. Please advise.

## 2012-07-02 NOTE — Telephone Encounter (Signed)
Left message on voicemail to call office. Rx for Meloxicam was sent to pharmacy.

## 2012-07-09 ENCOUNTER — Ambulatory Visit (INDEPENDENT_AMBULATORY_CARE_PROVIDER_SITE_OTHER): Payer: BC Managed Care – PPO | Admitting: Internal Medicine

## 2012-07-09 ENCOUNTER — Encounter: Payer: Self-pay | Admitting: Internal Medicine

## 2012-07-09 VITALS — BP 158/110 | HR 79 | Temp 99.1°F | Resp 20 | Ht 69.0 in | Wt 241.0 lb

## 2012-07-09 DIAGNOSIS — E039 Hypothyroidism, unspecified: Secondary | ICD-10-CM

## 2012-07-09 DIAGNOSIS — Z8546 Personal history of malignant neoplasm of prostate: Secondary | ICD-10-CM

## 2012-07-09 DIAGNOSIS — I10 Essential (primary) hypertension: Secondary | ICD-10-CM

## 2012-07-09 DIAGNOSIS — M1990 Unspecified osteoarthritis, unspecified site: Secondary | ICD-10-CM

## 2012-07-09 DIAGNOSIS — Z0000 Encounter for general adult medical examination without abnormal findings: Secondary | ICD-10-CM

## 2012-07-09 DIAGNOSIS — Z86010 Personal history of colonic polyps: Secondary | ICD-10-CM

## 2012-07-09 MED ORDER — LEVOTHYROXINE SODIUM 50 MCG PO TABS: 50.0000 ug | ORAL_TABLET | Freq: Every day | ORAL | Status: DC

## 2012-07-09 MED ORDER — MELOXICAM 15 MG PO TABS: 15.0000 mg | ORAL_TABLET | Freq: Every day | ORAL | Status: DC

## 2012-07-09 MED ORDER — ATORVASTATIN CALCIUM 40 MG PO TABS: 40.0000 mg | ORAL_TABLET | Freq: Every day | ORAL | Status: DC

## 2012-07-09 MED ORDER — LISINOPRIL-HYDROCHLOROTHIAZIDE 20-12.5 MG PO TABS: 1.0000 | ORAL_TABLET | Freq: Every day | ORAL | Status: DC

## 2012-07-09 NOTE — Patient Instructions (Signed)
Limit your sodium (Salt) intake  You need to lose weight.  Consider a lower calorie diet and regular exercise.  Please check your blood pressure on a regular basis.  If it is consistently greater than 150/90, please make an office appointment.  Return in 3 months for follow-up  

## 2012-07-09 NOTE — Progress Notes (Signed)
Patient ID: [name], male   DOB: 1951/01/05, 62 y.o.   MRN: [id number]  Subjective:    Patient ID: [name], male    DOB: [birth year], 62 y.o.   MRN: [id number]  Hypertension Pertinent negatives include no chest pain, headaches, neck pain, palpitations or shortness of breath.   History of Present Illness:   62 year-old patient who is in today for an annual examination. His long history of hypertension, but has been off his antihypertensive medications for approximately 1 month. He has advanced arthritis. History depression, which has been stable off his medication has remote history of prostate cancer, and allergic rhinitis.  He has a history of hypothyroidism has also been off his  thyroid supplement He has a history of colonic polyps and will need followup colonoscopy in one year. He is followed at the [facility] system and now is ready for total knee replacement surgery for the left knee. He has chronic pain and end-stage disease.  The patient has a history of stroke vascular disease and was hospitalized for lacunar stroke in June of 2011. He has had a prior laminectomy in March of 2012 which was consultative by a pulmonary embolism. He was treated with Coumadin anticoagulation for 6 months  Preventive Screening-Counseling & Management  Alcohol-Tobacco  Smoking Status: never   Allergies (verified):  No Known Drug Allergies   Past History:  Past Medical History:  Allergic rhinitis  Prostate cancer, hx of  Depression  Osteoarthritis  Insomnia  ED  history of hypertension  Colonic polyps, hx of  Hypothyroidism  Hyperlipidemia Status post lacunar stroke June 2011 Status post acute pulmonary embolism March 2012  Past Surgical History:   Status post laminectomy March 2012 Prostatectomy 1998  Tonsillectomy  Knee surgery L  Elbow surgery  Inguinal herniorrhaphy  fracture right lower leg  colonoscopy or sigmoidoscopy in 1998  colonoscopy june 2010   Family  History:  Reviewed history from 07/28/2008 and no changes required.  father died age 64, MI  mother died at 33 from a motor vehicle accident had a history of asthma  two brothers 3 sisters-  positive for coronary artery disease (2)  One sister died of MI (64)   Social History:  Reviewed history from 04/30/2007 and no changes required.  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  HENT: Negative for hearing loss, ear pain, congestion, rhinorrhea, sneezing, mouth sores, trouble swallowing, neck pain, neck stiffness, dental problem, voice change, sinus pressure and tinnitus.   Eyes: Negative for photophobia, pain, redness and visual disturbance.  Respiratory: Negative for apnea, cough, choking, chest tightness, shortness of breath and wheezing.   Cardiovascular: Negative for chest pain, palpitations and leg swelling.  Gastrointestinal: Negative for nausea, vomiting, abdominal pain, diarrhea, constipation, blood in stool, abdominal distention, anal bleeding and rectal pain.  Genitourinary: Negative for dysuria, urgency, frequency, hematuria, flank pain, decreased urine volume, discharge, penile swelling, scrotal swelling, difficulty urinating, genital sores and testicular pain.  Musculoskeletal: Positive for back pain and arthralgias. Negative for myalgias, joint swelling and gait problem.  Skin: Negative for color change, rash and wound.  Neurological: Negative for dizziness, tremors, seizures, syncope, facial asymmetry, speech difficulty, weakness, light-headedness, numbness and headaches.  Hematological: Negative for adenopathy. Does not bruise/bleed easily.  Psychiatric/Behavioral: Negative for suicidal ideas, hallucinations, behavioral problems, confusion, sleep disturbance, self-injury, dysphoric mood, decreased concentration and agitation. The patient  is not  nervous/anxious.        Objective:   Physical Exam  Constitutional: He appears well-developed and well-nourished.  HENT:  Head: Normocephalic and atraumatic.  Right Ear: External ear normal.  Left Ear: External ear normal.  Nose: Nose normal.  Mouth/Throat: Oropharynx is clear and moist.  Eyes: Conjunctivae and EOM are normal. Pupils are equal, round, and reactive to light. No scleral icterus.  Neck: Normal range of motion. Neck supple. No JVD present. No thyromegaly present.  Cardiovascular: Regular rhythm, normal heart sounds and intact distal pulses.  Exam reveals no gallop and no friction rub.   No murmur heard. Pulmonary/Chest: Effort normal and breath sounds normal. He exhibits no tenderness.  Abdominal: Soft. Bowel sounds are normal. He exhibits no distension and no mass. There is no tenderness.  Genitourinary: Penis normal.  Uncircumcised  Musculoskeletal: Normal range of motion. He exhibits no edema and no tenderness.  Left knee with some hypertrophic changes and slightly warm to touch  Lymphadenopathy:    He has no cervical adenopathy.  Neurological: He is alert. He has normal reflexes. No cranial nerve deficit. Coordination normal.  Skin: Skin is warm and dry. No rash noted.  Psychiatric: He has a normal mood and affect. His behavior is normal.          Assessment & Plan:   Annual clinical exam Status post pulmonary embolism following laminectomy Cerebrovascular disease status post lacunar stroke June 2011 Dyslipidemia. We'll increase his Lipitor to 40 mg daily. Compliance stressed he has been off statin therapy Hypothyroidism. We'll resume levothyroxin Hypertension suboptimal control. Compliance stressed medication refilled   Recheck 3 months

## 2012-07-16 ENCOUNTER — Telehealth: Payer: Self-pay | Admitting: Internal Medicine

## 2012-07-16 DIAGNOSIS — Z96659 Presence of unspecified artificial knee joint: Secondary | ICD-10-CM

## 2012-07-16 NOTE — Telephone Encounter (Signed)
Please refer to Dr. Despina Hick

## 2012-07-16 NOTE — Telephone Encounter (Signed)
Pt would like referral to Dr Lequita Halt, for knee replacement.

## 2012-07-17 NOTE — Telephone Encounter (Signed)
Left detailed message that referral was done for Ortho Dr. Lequita Halt as requested and someone should be contacting you.

## 2012-07-25 ENCOUNTER — Encounter: Payer: Self-pay | Admitting: Internal Medicine

## 2012-08-05 ENCOUNTER — Telehealth: Payer: Self-pay | Admitting: Internal Medicine

## 2012-08-05 NOTE — Telephone Encounter (Signed)
Patient Information:  Caller Name: Prabhjot  Phone: [phone]  Patient: [name], [name]  Gender: Male  DOB: [birth year]  Age: 62 Years  PCP: Eleonore Chiquito [facility])  Office Follow Up:  Does the office need to follow up with this patient?: Yes  Instructions For The Office: Please call patient to discuss proposed treatment plan.  RN Note:  Patient refuses to call or go to [facility].  He did agree to a Charity fundraiser and requests that Dr. Kirtland Bouchard prescribe an antidepressant to get him through this depressive period.  Symptoms  Reason For Call & Symptoms: Onset 1 week ago depression sxs: in bed all day, insomnia, irritable, SI, hopelessness, and helplessness.  Reviewed Health History In EMR: Yes  Reviewed Medications In EMR: Yes  Reviewed Allergies In EMR: Yes  Reviewed Surgeries / Procedures: Yes  Date of Onset of Symptoms: 07/29/2012  Guideline(s) Used:  Depression  Suicide Concerns  Disposition Per Guideline:   Call Local Agency Now  Reason For Disposition Reached:   Recurrent thoughts of death (e.g., "life is not worth living") but not threatening suicide  Advice Given:  Reassurance:   People with depression do get through this -- even people who feel as badly as you feel now. You can be helped.  Encourage the caller to talk about his/her problems and feelings.  Offer hope.  Phrases to USE or AVOID  Use: Tell me more about how you are doing, I'm sorry, what is the hardest part of your day  Avoid: I understand how you feel, he is in the Lord's hands, it is for the best  Suggestions for Healthy Living  Eat healthy: Eat a well-balanced diet.  Get more sleep: Most people need 7-8 hours of sleep each night. Being well-rested improves your attitude and your sense of physical well-being.  Communicate: Share how you are feeling with someone in your life who is a good listener. Make certain that your spouse, family, or friends know how you are feeling.  Exercise regularly:  Take a daily walk.  Avoid alcohol.  Stay Active  Get out of your house or apartment periodically. Go on an outing with a family member or a friend. Go to the store. Go to a movie.  Take a daily walk.  Call Back If:  You want to talk with a counselor  You feel like harming yourself  You become worse.  Call Back If:  You want to talk with a counselor  You feel like harming yourself  You become worse.  Patient Refused Recommendation:  Patient Requests Prescription  Patient refuses to call or go to [facility].  He did agree to a Charity fundraiser and requests that Dr. Kirtland Bouchard prescribe an antidepressant to get him through this depressive period.

## 2012-08-06 IMAGING — CT CT HEAD W/O CM
1 series · 16 of 30 positions shown, 20 images · non-contrast
Comparison: MR 08/17/2009

CLINICAL DATA: Headache, laceration post fall

CT HEAD WITHOUT CONTRAST
TECHNIQUE: Contiguous axial images were obtained from the base of
the skull through the vertex without contrast.

[Series 2: headseq 4.8 h45s · axial · 0.43mm/px · z∈[-117,+36]mm · 16 of 36 slices shown, 20 images]
[im 2/36  brain]
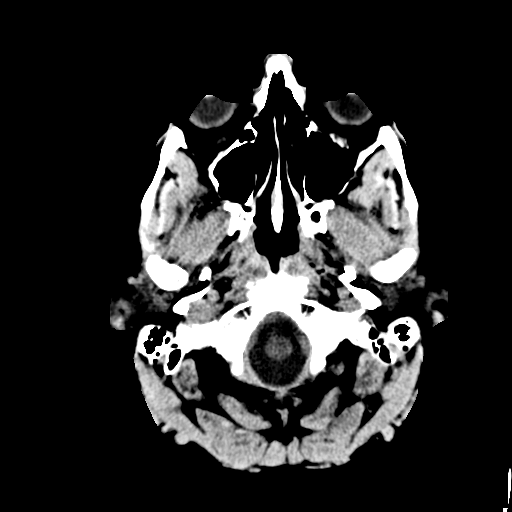
[im 2/36  bone]
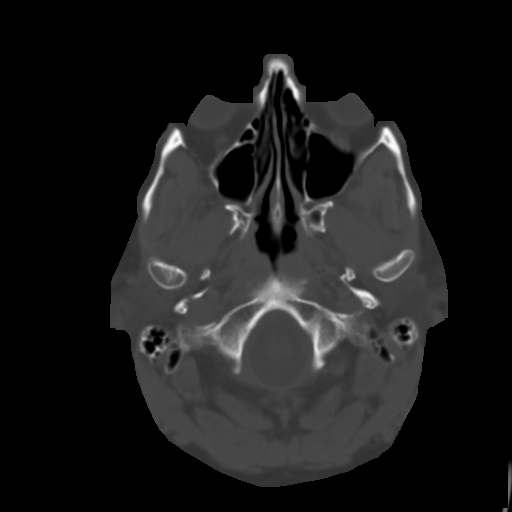
[im 4/36  brain]
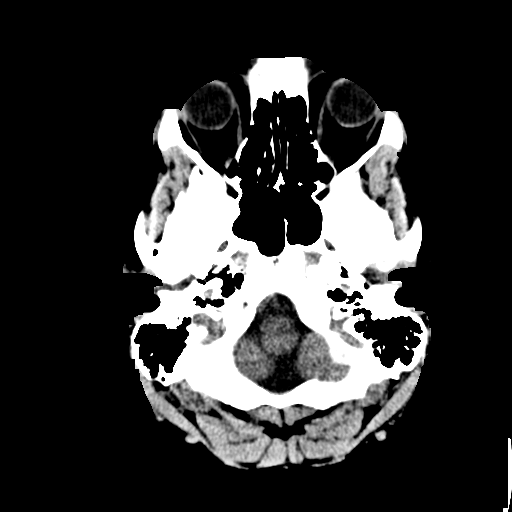
[im 7/36  brain]
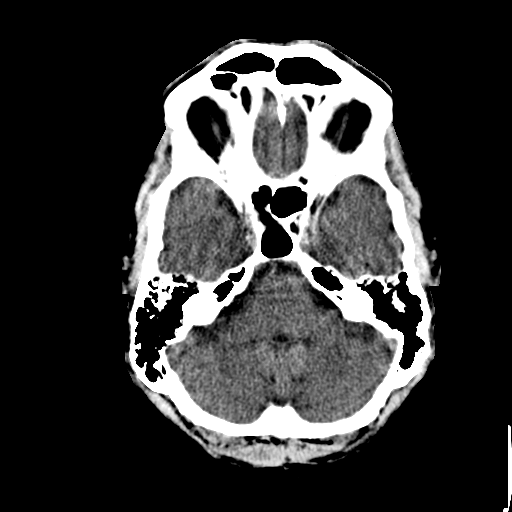
[im 9/36  brain]
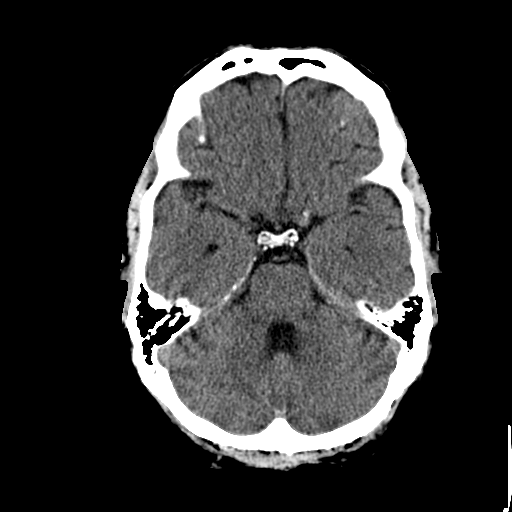
[im 10/36  brain]
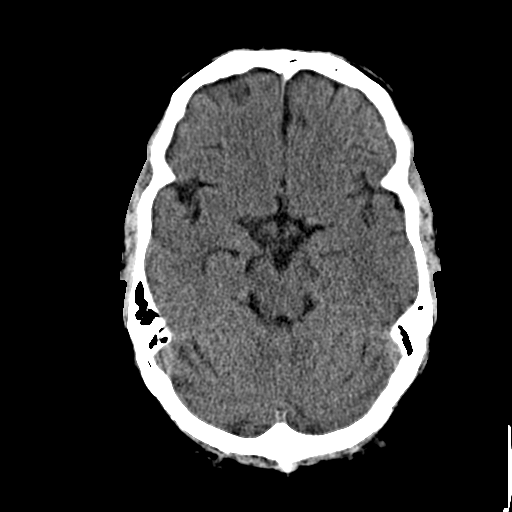
[im 10/36  bone]
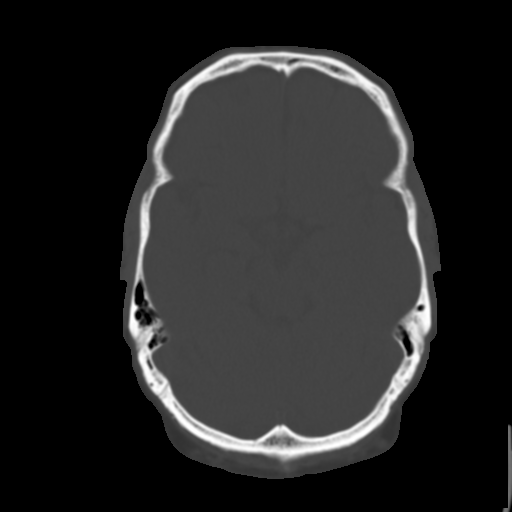
[im 13/36  brain]
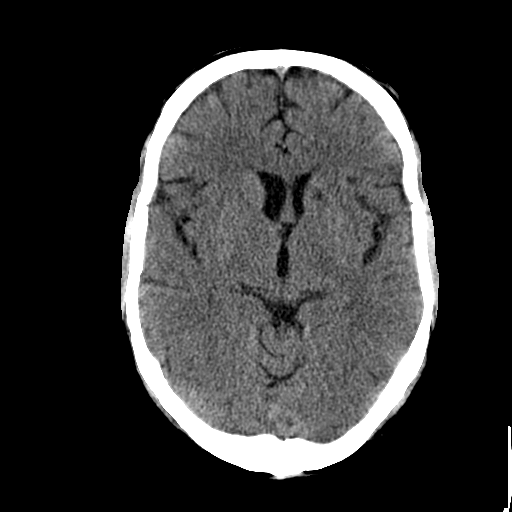
[im 15/36  brain]
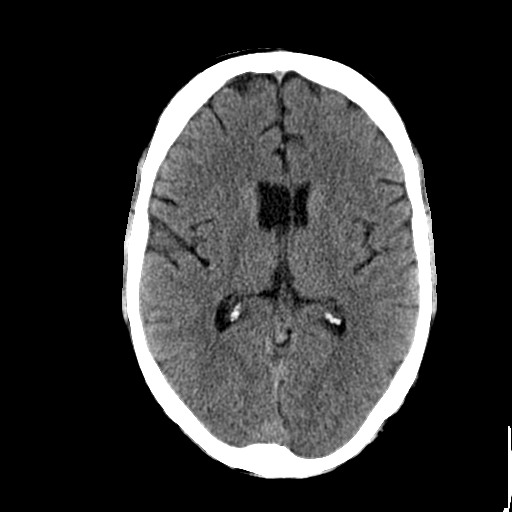
[im 17/36  brain]
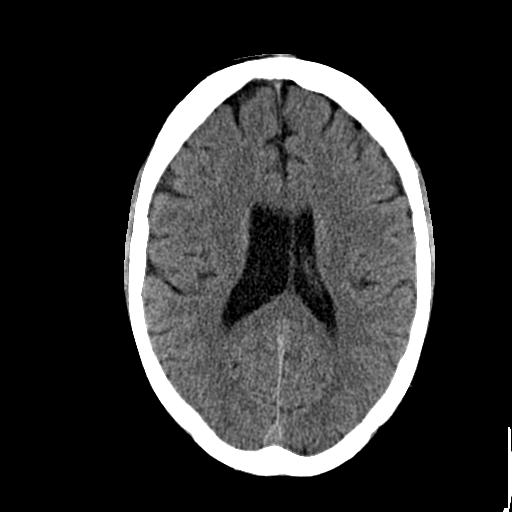
[im 19/36  brain]
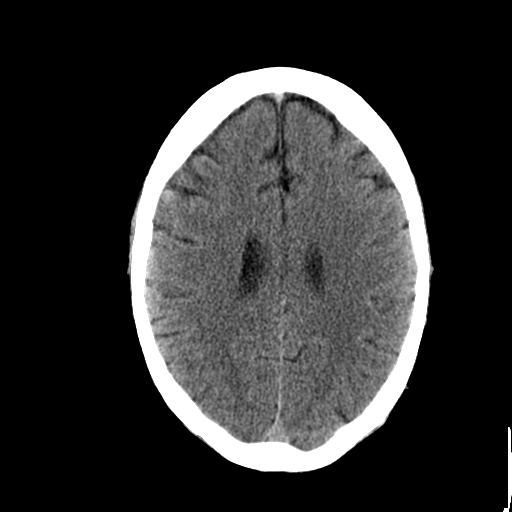
[im 19/36  bone]
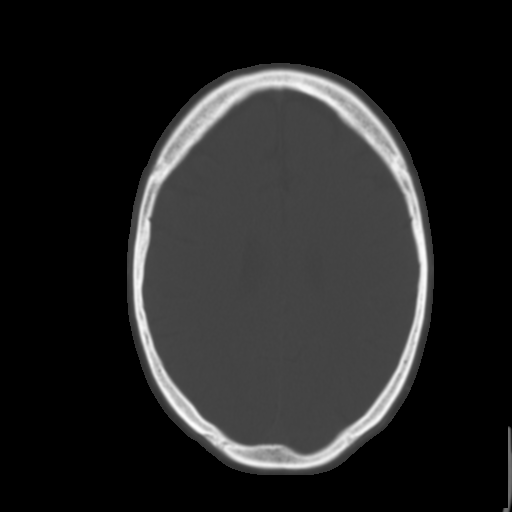
[im 21/36  brain]
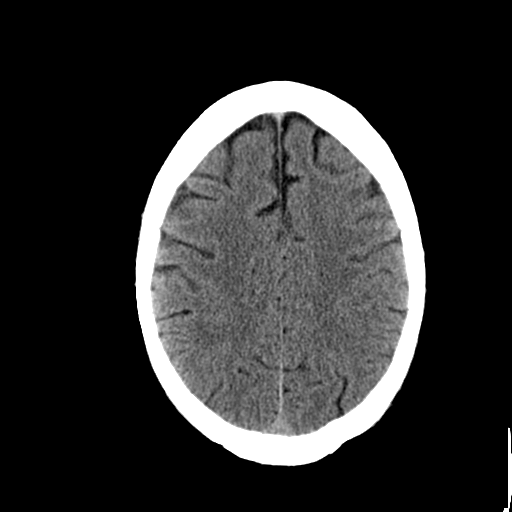
[im 23/36  brain]
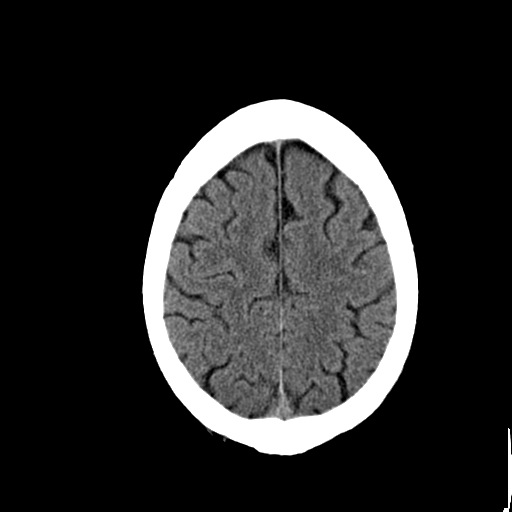
[im 26/36  brain]
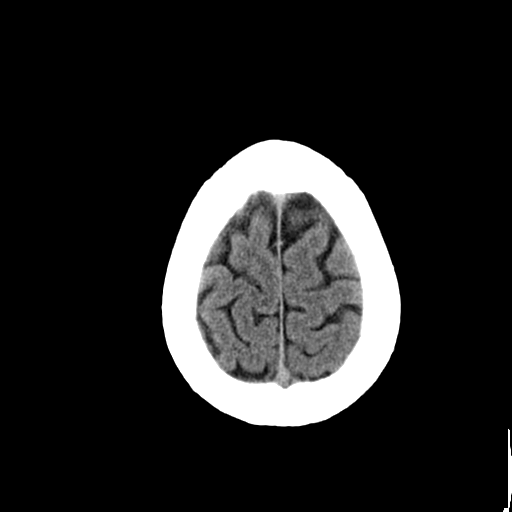
[im 27/36  brain]
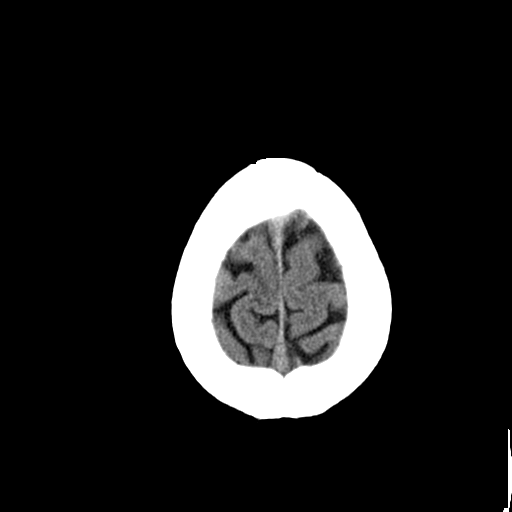
[im 27/36  bone]
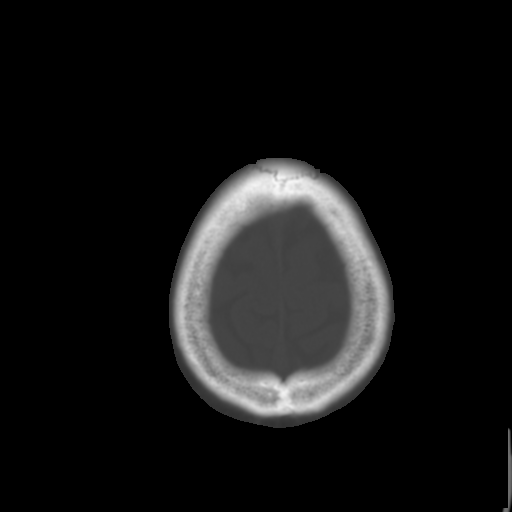
[im 29/36  brain]
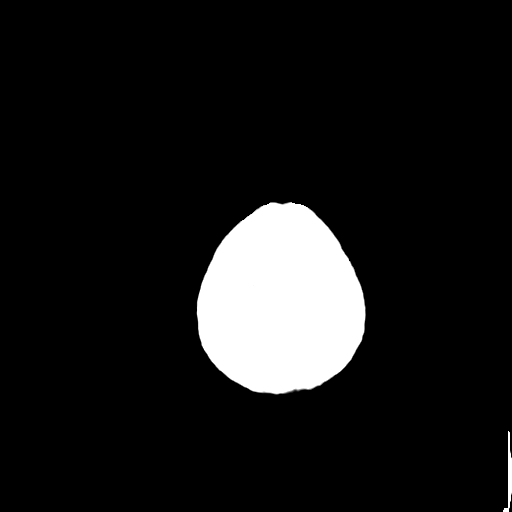
[im 32/36  brain]
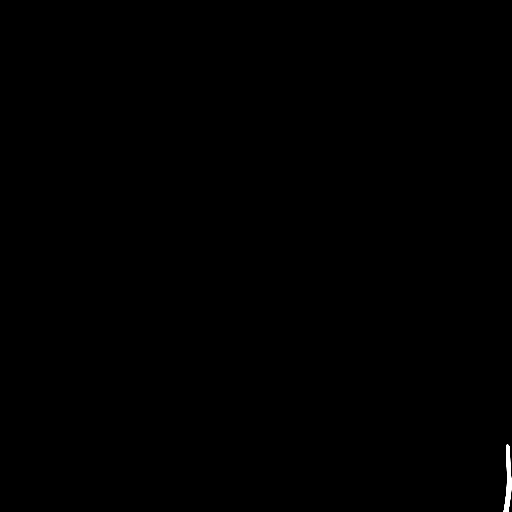
[im 34/36  brain]
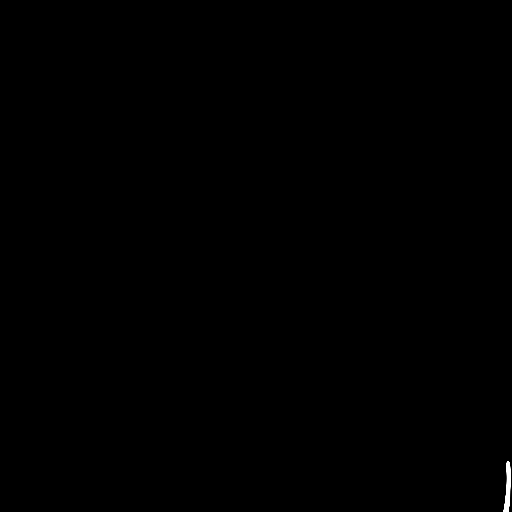

[16 of 30 positions shown; findings below may reference images not displayed]

FINDINGS: Old left caudate nucleus lacunar infarct. There is no
evidence of acute intracranial hemorrhage, brain edema, mass
lesion, acute infarction,   mass effect, or midline shift. Acute
infarct may be inapparent on noncontrast CT.  No other intra-axial
abnormalities are seen, and the ventricles and sulci are within
normal limits in size and symmetry.   No abnormal extra-axial fluid
collections or masses are identified.  No significant calvarial
abnormality.
IMPRESSION: 1. Negative for bleed or other acute intracranial process.

## 2012-08-06 MED ORDER — PAROXETINE HCL 20 MG PO TABS: 20.0000 mg | ORAL_TABLET | ORAL | Status: DC

## 2012-08-06 NOTE — Telephone Encounter (Signed)
Spoke to pt told him spoke to Dr. Tawanna Cooler who is covering for Dr. Kirtland Bouchard and he said would start back on Paxil 20 mg one tablet daily at bedtime and follow up with Dr. Kirtland Bouchard next week. Pt verbalized understanding. Told pt appt scheduled for June 10th at 9:15 with Dr. Amador Cunas and will send Rx to pharmacy. Pt verbalized understanding.

## 2012-08-13 ENCOUNTER — Encounter: Payer: Self-pay | Admitting: Internal Medicine

## 2012-08-13 ENCOUNTER — Ambulatory Visit (INDEPENDENT_AMBULATORY_CARE_PROVIDER_SITE_OTHER): Payer: BC Managed Care – PPO | Admitting: Internal Medicine

## 2012-08-13 VITALS — BP 130/86 | HR 72 | Temp 98.7°F | Resp 20 | Wt 236.0 lb

## 2012-08-13 DIAGNOSIS — F329 Major depressive disorder, single episode, unspecified: Secondary | ICD-10-CM

## 2012-08-13 DIAGNOSIS — I10 Essential (primary) hypertension: Secondary | ICD-10-CM

## 2012-08-13 IMAGING — CR DG LUMBAR SPINE 1V
1 series · 1 of 1 positions shown · non-contrast
Comparison: Lumbar spine MRI 05/04/2010.

CLINICAL DATA: Lumbar fusion.

LUMBAR SPINE - 1 VIEW

[view not recorded]
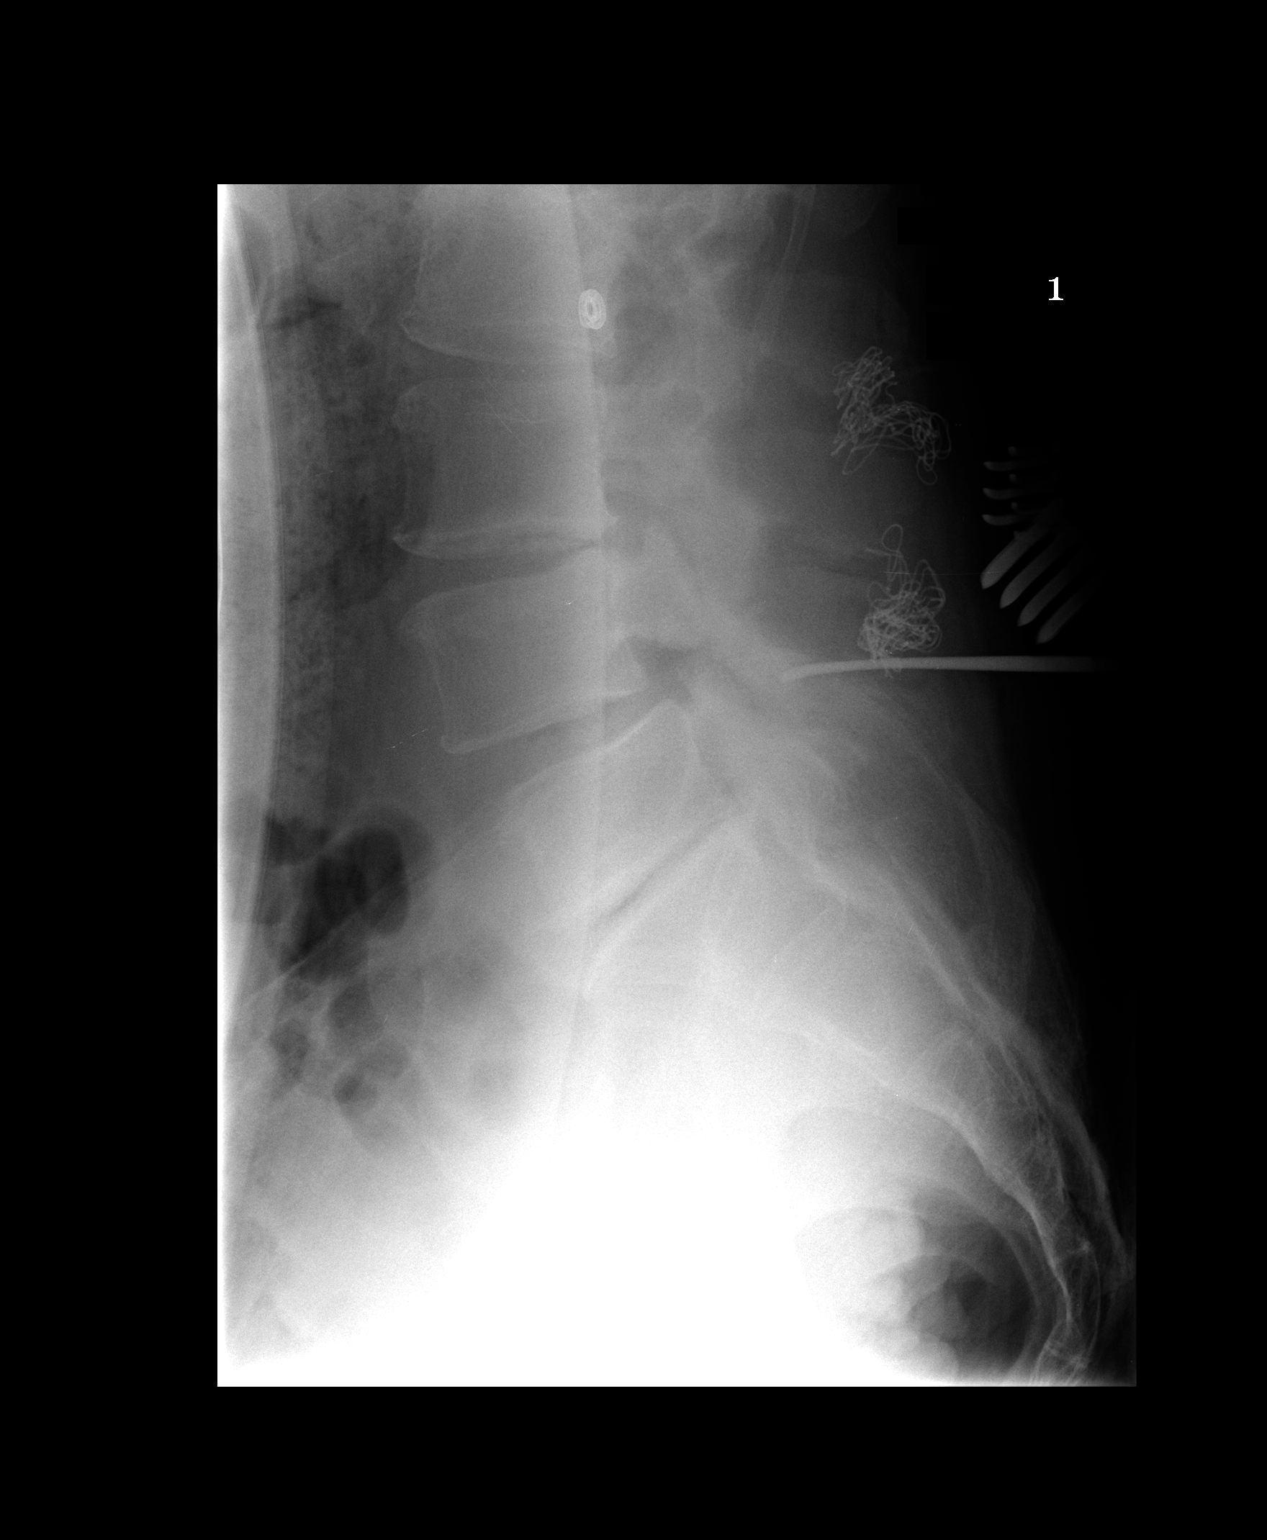

[1 of 1 positions shown; findings below may reference images not displayed]

FINDINGS: Lateral lumbar spine film from the operating room
demonstrates a surgical instrument marking the L4-5 disc space.
IMPRESSION: L4-5 marked intraoperatively.

## 2012-08-13 MED ORDER — PAROXETINE HCL 20 MG PO TABS: 20.0000 mg | ORAL_TABLET | ORAL | Status: DC

## 2012-08-13 NOTE — Progress Notes (Signed)
Subjective:    Patient ID: [name], male    DOB: 12-Feb-1951, 62 y.o.   MRN: [id number]  HPI  62 year old patient who is seen one month ago for an annual exam. He has a history depression but was stable off medication at that time. For the past few weeks he has had worsening depression in 7 days ago proximal edema was resumed. He states that already he feels much better on this medication. He is treated hypertension and has been compliant with his medications. Blood pressure is better controlled today.  Past Medical History  Diagnosis Date  . Allergy   . Depression   . Prostate cancer   . Osteoarthritis   . Insomnia   . Erectile dysfunction   . Hypertension   . Hx of colonic polyps   . Hypothyroidism   . Stroke, lacunar     History   Social History  . Marital Status: Single    Spouse Name: N/A    Number of Children: N/A  . Years of Education: N/A   Occupational History  . Not on file.   Social History Main Topics  . Smoking status: Never Smoker   . Smokeless tobacco: Current User    Types: Chew  . Alcohol Use: 1.0 oz/week    2 drink(s) per week  . Drug Use: No  . Sexually Active: Not on file   Other Topics Concern  . Not on file   Social History Narrative  . No narrative on file    Past Surgical History  Procedure Laterality Date  . Prostatectomy    . Tonsillectomy    . Knee surgery    . Elbow surgery    . Tibia fracture surgery    . Hernia repair      Family History  Problem Relation Age of Onset  . Asthma Mother   . Heart attack Father   . Heart attack Sister   . Coronary artery disease      fhx    No Known Allergies  Current Outpatient Prescriptions on File Prior to Visit  Medication Sig Dispense Refill  . aspirin 81 MG tablet Take 81 mg by mouth daily.        Marland Kitchen atorvastatin (LIPITOR) 40 MG tablet Take 1 tablet (40 mg total) by mouth daily.  90 tablet  4  . levothyroxine (SYNTHROID, LEVOTHROID) 50 MCG tablet Take 1 tablet (50 mcg  total) by mouth daily.  90 tablet  6  . lisinopril-hydrochlorothiazide (PRINZIDE,ZESTORETIC) 20-12.5 MG per tablet Take 1 tablet by mouth daily.  90 tablet  3  . meloxicam (MOBIC) 15 MG tablet Take 1 tablet (15 mg total) by mouth daily.  90 tablet  4  . mometasone (NASONEX) 50 MCG/ACT nasal spray Place 2 sprays into the nose daily.  17 g  6  . Multiple Vitamin (MULTIVITAMIN) tablet Take 1 tablet by mouth daily.        . traMADol (ULTRAM-ER) 100 MG 24 hr tablet Take 100 mg by mouth daily.       No current facility-administered medications on file prior to visit.    BP 130/86  Pulse 72  Temp(Src) 98.7 F (37.1 C) (Oral)  Resp 20  Wt 236 lb (107.049 kg)  BMI 34.84 kg/m2  SpO2 98%       Review of Systems  Constitutional: Negative for fever, chills, appetite change and fatigue.  HENT: Negative for hearing loss, ear pain, congestion, sore throat, trouble swallowing, neck stiffness, dental problem,  voice change and tinnitus.   Eyes: Negative for pain, discharge and visual disturbance.  Respiratory: Negative for cough, chest tightness, wheezing and stridor.   Cardiovascular: Negative for chest pain, palpitations and leg swelling.  Gastrointestinal: Negative for nausea, vomiting, abdominal pain, diarrhea, constipation, blood in stool and abdominal distention.  Genitourinary: Negative for urgency, hematuria, flank pain, discharge, difficulty urinating and genital sores.  Musculoskeletal: Negative for myalgias, back pain, joint swelling, arthralgias and gait problem.  Skin: Negative for rash.  Neurological: Negative for dizziness, syncope, speech difficulty, weakness, numbness and headaches.  Hematological: Negative for adenopathy. Does not bruise/bleed easily.  Psychiatric/Behavioral: Positive for dysphoric mood and decreased concentration. Negative for behavioral problems. The patient is not nervous/anxious.        Objective:   Physical Exam  Constitutional: He appears well-developed  and well-nourished. No distress.  Blood pressure 120/84  Psychiatric: He has a normal mood and affect. His behavior is normal.          Assessment & Plan:   Relapse clinical depression. Will continue proximal obtained which has been quite helpful in the past. The patient feels he already has had a nice, corresponds after only one week. Hypertension stable. Continue present regimen  Recheck 4 months

## 2012-10-09 ENCOUNTER — Ambulatory Visit (INDEPENDENT_AMBULATORY_CARE_PROVIDER_SITE_OTHER): Payer: BC Managed Care – PPO | Admitting: Internal Medicine

## 2012-10-09 ENCOUNTER — Encounter: Payer: Self-pay | Admitting: Internal Medicine

## 2012-10-09 VITALS — BP 130/86 | HR 88 | Temp 98.7°F | Resp 20 | Wt 240.0 lb

## 2012-10-09 DIAGNOSIS — F329 Major depressive disorder, single episode, unspecified: Secondary | ICD-10-CM

## 2012-10-09 DIAGNOSIS — M1990 Unspecified osteoarthritis, unspecified site: Secondary | ICD-10-CM

## 2012-10-09 DIAGNOSIS — I10 Essential (primary) hypertension: Secondary | ICD-10-CM

## 2012-10-09 NOTE — Patient Instructions (Signed)
Limit your sodium (Salt) intake    It is important that you exercise regularly, at least 20 minutes 3 to 4 times per week.  If you develop chest pain or shortness of breath seek  medical attention.  Please check your blood pressure on a regular basis.  If it is consistently greater than 150/90, please make an office appointment.  Return in 6 months for follow-up   

## 2012-10-09 NOTE — Progress Notes (Signed)
Subjective:    Patient ID: [name], male    DOB: [birth year], 62 y.o.   MRN: [id number]  HPI   62 year old patient who is seen today for followup. He is scheduled for elective total knee replacement surgery in October. He has treated hypertension and a history of depression. He now has been on Paxil for approximately 6 weeks and has done quite well on this medication. He feels that his depression has largely resolved. His only complaint is knee pain. He denies any cardiopulmonary complaints.  Past Medical History  Diagnosis Date  . Allergy   . Depression   . Prostate cancer   . Osteoarthritis   . Insomnia   . Erectile dysfunction   . Hypertension   . Hx of colonic polyps   . Hypothyroidism   . Stroke, lacunar     History   Social History  . Marital Status: Single    Spouse Name: N/A    Number of Children: N/A  . Years of Education: N/A   Occupational History  . Not on file.   Social History Main Topics  . Smoking status: Never Smoker   . Smokeless tobacco: Current User    Types: Chew  . Alcohol Use: 1.0 oz/week    2 drink(s) per week  . Drug Use: No  . Sexually Active: Not on file   Other Topics Concern  . Not on file   Social History Narrative  . No narrative on file    Past Surgical History  Procedure Laterality Date  . Prostatectomy    . Tonsillectomy    . Knee surgery    . Elbow surgery    . Tibia fracture surgery    . Hernia repair      Family History  Problem Relation Age of Onset  . Asthma Mother   . Heart attack Father   . Heart attack Sister   . Coronary artery disease      fhx    No Known Allergies  Current Outpatient Prescriptions on File Prior to Visit  Medication Sig Dispense Refill  . aspirin 81 MG tablet Take 81 mg by mouth daily.        Marland Kitchen atorvastatin (LIPITOR) 40 MG tablet Take 1 tablet (40 mg total) by mouth daily.  90 tablet  4  . levothyroxine (SYNTHROID, LEVOTHROID) 50 MCG tablet Take 1 tablet (50 mcg total) by  mouth daily.  90 tablet  6  . lisinopril-hydrochlorothiazide (PRINZIDE,ZESTORETIC) 20-12.5 MG per tablet Take 1 tablet by mouth daily.  90 tablet  3  . meloxicam (MOBIC) 15 MG tablet Take 1 tablet (15 mg total) by mouth daily.  90 tablet  4  . mometasone (NASONEX) 50 MCG/ACT nasal spray Place 2 sprays into the nose daily.  17 g  6  . Multiple Vitamin (MULTIVITAMIN) tablet Take 1 tablet by mouth daily.        Marland Kitchen PARoxetine (PAXIL) 20 MG tablet Take 1 tablet (20 mg total) by mouth every morning.  90 tablet  4  . traMADol (ULTRAM-ER) 100 MG 24 hr tablet Take 100 mg by mouth daily.       No current facility-administered medications on file prior to visit.    BP 130/86  Pulse 88  Temp(Src) 98.7 F (37.1 C) (Oral)  Resp 20  Wt 240 lb (108.863 kg)  BMI 35.43 kg/m2  SpO2 97%       Review of Systems  Musculoskeletal: Positive for joint swelling, arthralgias and gait problem.  Objective:   Physical Exam  Constitutional: He is oriented to person, place, and time. He appears well-developed.  Blood pressure 130/84  HENT:  Head: Normocephalic.  Right Ear: External ear normal.  Left Ear: External ear normal.  Eyes: Conjunctivae and EOM are normal.  Neck: Normal range of motion.  Cardiovascular: Normal rate and normal heart sounds.   Pulmonary/Chest: Breath sounds normal.  Abdominal: Bowel sounds are normal.  Musculoskeletal: Normal range of motion. He exhibits no edema and no tenderness.  Neurological: He is alert and oriented to person, place, and time.  Psychiatric: He has a normal mood and affect. His behavior is normal.          Assessment & Plan:   Hypertension well controlled End-stage knee osteoarthritis. For elective surgical repair in October Depression clinically stable. Continue Paxil  Low-salt diet recommended Home blood pressure monitoring recommended Recheck 6 months or as needed No contraindications to surgery

## 2012-12-02 ENCOUNTER — Other Ambulatory Visit: Payer: Self-pay | Admitting: Orthopedic Surgery

## 2012-12-09 ENCOUNTER — Encounter (HOSPITAL_COMMUNITY): Payer: Self-pay | Admitting: Pharmacy Technician

## 2012-12-10 ENCOUNTER — Ambulatory Visit (HOSPITAL_COMMUNITY)
Admission: RE | Admit: 2012-12-10 | Discharge: 2012-12-10 | Disposition: A | Discharge status: Home / Self Care | Payer: BC Managed Care – PPO | Source: Ambulatory Visit | Attending: Orthopedic Surgery | Admitting: Orthopedic Surgery

## 2012-12-10 ENCOUNTER — Encounter (HOSPITAL_COMMUNITY)
Admission: RE | Admit: 2012-12-10 | Discharge: 2012-12-10 | Disposition: A | Discharge status: Home / Self Care | Payer: BC Managed Care – PPO | Source: Ambulatory Visit | Attending: Orthopedic Surgery | Admitting: Orthopedic Surgery

## 2012-12-10 ENCOUNTER — Encounter (HOSPITAL_COMMUNITY): Payer: Self-pay

## 2012-12-10 DIAGNOSIS — M1710 Unilateral primary osteoarthritis, unspecified knee: Secondary | ICD-10-CM | POA: Insufficient documentation

## 2012-12-10 DIAGNOSIS — Z01812 Encounter for preprocedural laboratory examination: Secondary | ICD-10-CM | POA: Insufficient documentation

## 2012-12-10 DIAGNOSIS — M47814 Spondylosis without myelopathy or radiculopathy, thoracic region: Secondary | ICD-10-CM | POA: Insufficient documentation

## 2012-12-10 DIAGNOSIS — G4700 Insomnia, unspecified: Secondary | ICD-10-CM

## 2012-12-10 HISTORY — DX: Insomnia, unspecified: G47.00

## 2012-12-10 HISTORY — PX: BACK SURGERY: SHX140

## 2012-12-10 LAB — CBC
HCT: 42.8 % (ref 39.0–52.0)
Hemoglobin: 14.5 g/dL (ref 13.0–17.0)
MCH: 31.3 pg (ref 26.0–34.0)
MCV: 92.2 fL (ref 78.0–100.0)
RBC: 4.64 MIL/uL (ref 4.22–5.81)
RDW: 13.8 % (ref 11.5–15.5)

## 2012-12-10 LAB — PROTIME-INR: INR: 0.9 (ref 0.00–1.49)

## 2012-12-10 LAB — COMPREHENSIVE METABOLIC PANEL
CO2: 25 mEq/L (ref 19–32)
Calcium: 9.9 mg/dL (ref 8.4–10.5)
Creatinine, Ser: 0.95 mg/dL (ref 0.50–1.35)
GFR calc Af Amer: >90 mL/min (ref >90)
GFR calc non Af Amer: 87 mL/min — ABNORMAL LOW (ref >90)
Glucose, Bld: 98 mg/dL (ref 70–99)
Potassium: 4.5 mEq/L (ref 3.5–5.1)

## 2012-12-10 LAB — SURGICAL PCR SCREEN
MRSA, PCR: NEGATIVE
Staphylococcus aureus: POSITIVE — AB

## 2012-12-10 LAB — URINALYSIS, ROUTINE W REFLEX MICROSCOPIC
Bilirubin Urine: NEGATIVE
Glucose, UA: NEGATIVE mg/dL
Hgb urine dipstick: NEGATIVE
Ketones, ur: NEGATIVE mg/dL
Leukocytes, UA: NEGATIVE
Protein, ur: NEGATIVE mg/dL
Specific Gravity, Urine: 1.02 (ref 1.005–1.030)
pH: 7.5 (ref 5.0–8.0)

## 2012-12-10 LAB — APTT: aPTT: 29 seconds (ref 24–37)

## 2012-12-10 LAB — ABO/RH: ABO/RH(D): A POS

## 2012-12-10 NOTE — Progress Notes (Signed)
12-10-12 1610 Patient has positive PCR screen for Staph aureus, will need MUpirocin Rx called tp [facility] [phone].

## 2012-12-10 NOTE — Patient Instructions (Addendum)
20 WOODFIN KISS  12/10/2012   Your procedure is scheduled on: 10-13  -2014  Report to [facility] at        0645 AM ..  Call this number if you have problems the morning of surgery: [phone]  Or Presurgical Testing [phone](Braxtyn Dorff)      Do not eat food:After Midnight.   Take these medicines the morning of surgery with A SIP OF WATER: Tramadol(if needed).   Do not wear jewelry, make-up or nail polish.  Do not wear lotions, powders, or perfumes. You may wear deodorant.  Do not shave 12 hours prior to first CHG shower(legs and under arms).(face and neck okay.)  Do not bring valuables to the hospital.  Contacts, dentures or bridgework,body piercing,  may not be worn into surgery.  Leave suitcase in the car. After surgery it may be brought to your room.  For patients admitted to the hospital, checkout time is 11:00 AM the day of discharge.   Patients discharged the day of surgery will not be allowed to drive home. Must have responsible person with you x 24 hours once discharged.  Name and phone number of your driver: Phillip-son [phone] cell  Special Instructions: CHG(Chlorhedine 4%-"Hibiclens","Betasept","Aplicare") Shower Use Special Wash: see special instructions.(avoid face and genitals)   Please read over the following fact sheets that you were given: MRSA Information, Blood Transfusion fact sheet, Incentive Spirometry Instruction.    Failure to follow these instructions may result in Cancellation of your surgery.   Patient signature_______________________________________________________

## 2012-12-10 NOTE — Progress Notes (Signed)
Your Pt has screened with an elevated risk for obstructive sleep apnea using the Stop-Bang tool during a presurgical  Visit. A score of four or greater is an elevated risk. 

## 2012-12-10 NOTE — Pre-Procedure Instructions (Addendum)
12-10-12 EKG 5'14 Epic. CXR done today

## 2012-12-15 ENCOUNTER — Other Ambulatory Visit: Payer: Self-pay | Admitting: Surgical

## 2012-12-15 NOTE — H&P (Signed)
TOTAL KNEE ADMISSION H&P  Patient is being admitted for left total knee arthroplasty.  Subjective:  Chief Complaint:left knee pain.  HPI: [name], 62 y.o. male, has a history of pain and functional disability in the left knee due to arthritis and has failed non-surgical conservative treatments for greater than 12 weeks to includeNSAID's and/or analgesics, corticosteriod injections, viscosupplementation injections and activity modification.  Onset of symptoms was gradual, starting 20 years ago with gradually worsening course since that time. The patient noted prior procedures on the knee to include  arthroscopy and menisectomy on the left knee(s).  Patient currently rates pain in the left knee(s) at 7 out of 10 with activity. Patient has night pain, worsening of pain with activity and weight bearing, pain that interferes with activities of daily living, pain with passive range of motion, crepitus and joint swelling.  Patient has evidence of periarticular osteophytes, joint subluxation and joint space narrowing by imaging studies. There is no active infection.  Patient Active Problem List   Diagnosis Date Noted  . Sciatica 05/02/2010  . HYPOTHYROIDISM 08/06/2009  . DYSPRAXIA 08/06/2009  . COLONIC POLYPS, HX OF 08/06/2009  . URI 08/21/2007  . HYPERTENSION, BENIGN ESSENTIAL 10/15/2006  . DEPRESSION 10/11/2006  . ALLERGIC RHINITIS 10/11/2006  . OSTEOARTHRITIS 10/11/2006  . PROSTATE CANCER, HX OF 10/11/2006   Past Medical History  Diagnosis Date  . Allergy   . Depression   . Prostate cancer   . Osteoarthritis   . Insomnia 12-10-12    not a problem now  . Erectile dysfunction   . Hypertension   . Hx of colonic polyps   . Hypothyroidism   . Stroke, lacunar     "mini- episodes of severe hand shaking"    -  Deep venous thrombosis    Past Surgical History  Procedure Laterality Date  . Prostatectomy    . Tonsillectomy    . Knee surgery Left     open surgery  . Elbow surgery     . Tibia fracture surgery    . Hernia repair Left     [facility]  . Back surgery  12-10-12    '12-Lumbar fusion-retained hardware     Current outpatient prescriptions: aspirin 81 MG tablet, Take 81 mg by mouth daily.  , Disp: , Rfl: ;   atorvastatin (LIPITOR) 40 MG tablet, Take 40 mg by mouth every evening., Disp: , Rfl: ;   levothyroxine (SYNTHROID, LEVOTHROID) 50 MCG tablet, Take 50 mcg by mouth every evening., Disp: , Rfl: ;  lisinopril-hydrochlorothiazide (PRINZIDE,ZESTORETIC) 20-12.5 MG per tablet, Take 1 tablet by mouth every evening., Disp: , Rfl:  meloxicam (MOBIC) 15 MG tablet, Take 1 tablet (15 mg total) by mouth daily., Disp: 90 tablet, Rfl: 4;   Multiple Vitamin (MULTIVITAMIN) tablet, Take 1 tablet by mouth daily.  , Disp: , Rfl: ;   PARoxetine (PAXIL) 20 MG tablet, Take 20 mg by mouth every evening., Disp: , Rfl: ;   traMADol (ULTRAM) 50 MG tablet, Take 100 mg by mouth every 6 (six) hours as needed for pain., Disp: , Rfl:   No Known Allergies  History  Substance Use Topics  . Smoking status: Never Smoker   . Smokeless tobacco: Current User    Types: Chew  . Alcohol Use: 1.0 oz/week    2 drink(s) per week     Comment: weekends    Family History  Problem Relation Age of Onset  . Asthma Mother   . Heart attack Father   .  Heart attack Sister   . Coronary artery disease      fhx     Review of Systems  Constitutional: Negative.   HENT: Negative for congestion, ear discharge, ear pain, hearing loss, nosebleeds, sore throat and tinnitus.   Eyes: Negative.   Respiratory: Negative.  Negative for stridor.   Cardiovascular: Negative.   Gastrointestinal: Negative.   Genitourinary: Negative.   Musculoskeletal: Positive for back pain and joint pain. Negative for falls, myalgias and neck pain.       Left knee pain  Skin: Negative.   Neurological: Positive for headaches. Negative for tingling, tremors, sensory change, speech change, focal weakness, seizures and loss of  consciousness.  Endo/Heme/Allergies: Negative.   Psychiatric/Behavioral: Negative.     Objective:  Physical Exam  Constitutional: He is oriented to person, place, and time. He appears well-developed and well-nourished. No distress.  HENT:  Head: Normocephalic and atraumatic.  Right Ear: External ear normal.  Left Ear: External ear normal.  Eyes: Conjunctivae and EOM are normal.  Neck: Normal range of motion. Neck supple.  Cardiovascular: Normal rate, regular rhythm, normal heart sounds and intact distal pulses.   No murmur heard. Respiratory: Effort normal and breath sounds normal. No respiratory distress. He has no wheezes.  GI: Soft. Bowel sounds are normal. He exhibits no distension and no mass. There is no tenderness.  Musculoskeletal:       Right hip: Normal.       Left hip: Normal.       Right knee: He exhibits decreased range of motion. He exhibits no swelling, no effusion and no erythema. Tenderness found. Medial joint line and lateral joint line tenderness noted.       Left knee: He exhibits decreased range of motion, swelling and deformity. He exhibits no effusion and no erythema. Tenderness found. Medial joint line and lateral joint line tenderness noted.       Right lower leg: He exhibits no tenderness and no swelling.       Left lower leg: He exhibits no tenderness and no swelling.  His hips show normal range of motion with no discomfort. His right knee shows no effusion. Slight bit of varus on the right, range about 5 to 125. He has moderate crepitus on range of motion. He is tender medial greater than lateral with no instability. The left knee significant varus deformity with about a 10 degree flexion contracture, range 10 to 115. There is marked crepitus on range of motion. There is no effusion. He is tender medial greater than lateral. There is no instability noted.  Neurological: He is alert and oriented to person, place, and time. He has normal strength and  normal reflexes. No sensory deficit.  Skin: No rash noted. He is not diaphoretic. No erythema.  Psychiatric: He has a normal mood and affect. His behavior is normal.    Vitals Weight: 230 lb Height: 69 in Body Surface Area: 2.25 m Body Mass Index: 33.96 kg/m Pulse: 80 (Regular) BP: 134/86 (Sitting, Left Arm, Standard)  Imaging Review Plain radiographs demonstrate severe degenerative joint disease of the left knee(s). The overall alignment issignificant varus. The bone quality appears to be adequate for age and reported activity level.  Assessment/Plan:  End stage arthritis, left knee   The patient history, physical examination, clinical judgment of the provider and imaging studies are consistent with end stage degenerative joint disease of the left knee(s) and total knee arthroplasty is deemed medically necessary. The treatment options including medical management, injection  therapy arthroscopy and arthroplasty were discussed at length. The risks and benefits of total knee arthroplasty were presented and reviewed. The risks due to aseptic loosening, infection, stiffness, patella tracking problems, thromboembolic complications and other imponderables were discussed. The patient acknowledged the explanation, agreed to proceed with the plan and consent was signed. Patient is being admitted for inpatient treatment for surgery, pain control, PT, OT, prophylactic antibiotics, VTE prophylaxis, progressive ambulation and ADL's and discharge planning. The patient is planning to be discharged home with home health services    [place], [place]

## 2012-12-16 ENCOUNTER — Encounter (HOSPITAL_COMMUNITY): Payer: Self-pay | Admitting: *Deleted

## 2012-12-16 ENCOUNTER — Inpatient Hospital Stay (HOSPITAL_COMMUNITY)
Admission: RE | Admit: 2012-12-16 | Discharge: 2012-12-18 | DRG: 209 | Disposition: A | Discharge status: Home Health | Payer: BC Managed Care – PPO | Source: Ambulatory Visit | Attending: Orthopedic Surgery | Admitting: Orthopedic Surgery

## 2012-12-16 ENCOUNTER — Encounter (HOSPITAL_COMMUNITY)
Admission: RE | Disposition: A | Discharge status: Home Health | Payer: Self-pay | Source: Ambulatory Visit | Attending: Orthopedic Surgery

## 2012-12-16 ENCOUNTER — Ambulatory Visit (HOSPITAL_COMMUNITY): Payer: BC Managed Care – PPO | Admitting: Anesthesiology

## 2012-12-16 ENCOUNTER — Encounter (HOSPITAL_COMMUNITY): Payer: BC Managed Care – PPO | Admitting: Anesthesiology

## 2012-12-16 DIAGNOSIS — M1710 Unilateral primary osteoarthritis, unspecified knee: Principal | ICD-10-CM | POA: Diagnosis present

## 2012-12-16 DIAGNOSIS — F3289 Other specified depressive episodes: Secondary | ICD-10-CM | POA: Diagnosis present

## 2012-12-16 DIAGNOSIS — F329 Major depressive disorder, single episode, unspecified: Secondary | ICD-10-CM | POA: Diagnosis present

## 2012-12-16 DIAGNOSIS — Z8546 Personal history of malignant neoplasm of prostate: Secondary | ICD-10-CM

## 2012-12-16 DIAGNOSIS — E039 Hypothyroidism, unspecified: Secondary | ICD-10-CM | POA: Diagnosis present

## 2012-12-16 DIAGNOSIS — Z96652 Presence of left artificial knee joint: Secondary | ICD-10-CM

## 2012-12-16 DIAGNOSIS — Z79899 Other long term (current) drug therapy: Secondary | ICD-10-CM

## 2012-12-16 DIAGNOSIS — M179 Osteoarthritis of knee, unspecified: Secondary | ICD-10-CM | POA: Diagnosis present

## 2012-12-16 DIAGNOSIS — I10 Essential (primary) hypertension: Secondary | ICD-10-CM | POA: Diagnosis present

## 2012-12-16 HISTORY — PX: TOTAL KNEE ARTHROPLASTY: SHX125

## 2012-12-16 SURGERY — ARTHROPLASTY, KNEE, TOTAL
Anesthesia: General | Site: Knee | Laterality: Left | Wound class: Clean

## 2012-12-16 MED ORDER — DEXAMETHASONE 6 MG PO TABS
10.0000 mg | ORAL_TABLET | Freq: Every day | ORAL | Status: AC
  Administered 2012-12-17: 10 mg via ORAL
  Filled 2012-12-16: qty 1

## 2012-12-16 MED ORDER — POLYETHYLENE GLYCOL 3350 17 G PO PACK: 17.0000 g | PACK | Freq: Every day | ORAL | Status: DC | PRN

## 2012-12-16 MED ORDER — BUPIVACAINE HCL (PF) 0.25 % IJ SOLN
INTRAMUSCULAR | Status: AC
  Filled 2012-12-16: qty 30

## 2012-12-16 MED ORDER — STERILE WATER FOR IRRIGATION IR SOLN
Status: DC | PRN
  Administered 2012-12-16: 3000 mL

## 2012-12-16 MED ORDER — CEFAZOLIN SODIUM-DEXTROSE 2-3 GM-% IV SOLR
2.0000 g | INTRAVENOUS | Status: AC
  Administered 2012-12-16: 2 g via INTRAVENOUS

## 2012-12-16 MED ORDER — LACTATED RINGERS IV SOLN
INTRAVENOUS | Status: DC | PRN
  Administered 2012-12-16 (×2): via INTRAVENOUS

## 2012-12-16 MED ORDER — HYDROMORPHONE HCL PF 1 MG/ML IJ SOLN
INTRAMUSCULAR | Status: AC
  Filled 2012-12-16: qty 1

## 2012-12-16 MED ORDER — FENTANYL CITRATE 0.05 MG/ML IJ SOLN
INTRAMUSCULAR | Status: DC | PRN
  Administered 2012-12-16: 100 ug via INTRAVENOUS

## 2012-12-16 MED ORDER — SODIUM CHLORIDE 0.9 % IJ SOLN
INTRAMUSCULAR | Status: AC
  Filled 2012-12-16: qty 50

## 2012-12-16 MED ORDER — BUPIVACAINE LIPOSOME 1.3 % IJ SUSP
INTRAMUSCULAR | Status: DC | PRN
  Administered 2012-12-16: 20 mL

## 2012-12-16 MED ORDER — TRAMADOL HCL 50 MG PO TABS
50.0000 mg | ORAL_TABLET | Freq: Four times a day (QID) | ORAL | Status: DC | PRN
  Administered 2012-12-17 – 2012-12-18 (×4): 100 mg via ORAL
  Filled 2012-12-16 (×4): qty 2

## 2012-12-16 MED ORDER — DEXAMETHASONE SODIUM PHOSPHATE 10 MG/ML IJ SOLN
10.0000 mg | Freq: Every day | INTRAMUSCULAR | Status: AC
  Filled 2012-12-16: qty 1

## 2012-12-16 MED ORDER — BUPIVACAINE HCL 0.25 % IJ SOLN
INTRAMUSCULAR | Status: DC | PRN
  Administered 2012-12-16: 20 mL

## 2012-12-16 MED ORDER — DOCUSATE SODIUM 100 MG PO CAPS: 100.0000 mg | ORAL_CAPSULE | Freq: Two times a day (BID) | ORAL | Status: DC

## 2012-12-16 MED ORDER — BUPIVACAINE LIPOSOME 1.3 % IJ SUSP
20.0000 mL | Freq: Once | INTRAMUSCULAR | Status: DC
  Filled 2012-12-16: qty 20

## 2012-12-16 MED ORDER — ONDANSETRON HCL 4 MG PO TABS: 4.0000 mg | ORAL_TABLET | Freq: Four times a day (QID) | ORAL | Status: DC | PRN

## 2012-12-16 MED ORDER — PHENOL 1.4 % MT LIQD: 1.0000 | OROMUCOSAL | Status: DC | PRN

## 2012-12-16 MED ORDER — SODIUM CHLORIDE 0.9 % IV SOLN: INTRAVENOUS | Status: DC

## 2012-12-16 MED ORDER — METOCLOPRAMIDE HCL 5 MG/ML IJ SOLN: 5.0000 mg | Freq: Three times a day (TID) | INTRAMUSCULAR | Status: DC | PRN

## 2012-12-16 MED ORDER — LACTATED RINGERS IV SOLN: INTRAVENOUS | Status: DC

## 2012-12-16 MED ORDER — KETOROLAC TROMETHAMINE 15 MG/ML IJ SOLN
15.0000 mg | Freq: Four times a day (QID) | INTRAMUSCULAR | Status: AC | PRN
  Administered 2012-12-16: 15 mg via INTRAVENOUS

## 2012-12-16 MED ORDER — PROPOFOL 10 MG/ML IV BOLUS
INTRAVENOUS | Status: DC | PRN
  Administered 2012-12-16: 50 mg via INTRAVENOUS
  Administered 2012-12-16: 200 mg via INTRAVENOUS
  Administered 2012-12-16: 20 mg via INTRAVENOUS
  Administered 2012-12-16: 30 mg via INTRAVENOUS

## 2012-12-16 MED ORDER — BISACODYL 10 MG RE SUPP: 10.0000 mg | Freq: Every day | RECTAL | Status: DC | PRN

## 2012-12-16 MED ORDER — SODIUM CHLORIDE 0.9 % IR SOLN
Status: DC | PRN
  Administered 2012-12-16: 1000 mL

## 2012-12-16 MED ORDER — LEVOTHYROXINE SODIUM 50 MCG PO TABS
50.0000 ug | ORAL_TABLET | ORAL | Status: DC
  Administered 2012-12-16 – 2012-12-18 (×3): 50 ug via ORAL
  Filled 2012-12-16 (×3): qty 1

## 2012-12-16 MED ORDER — MORPHINE SULFATE 2 MG/ML IJ SOLN
1.0000 mg | INTRAMUSCULAR | Status: DC | PRN
  Administered 2012-12-16 – 2012-12-17 (×5): 2 mg via INTRAVENOUS
  Filled 2012-12-16 (×5): qty 1

## 2012-12-16 MED ORDER — 0.9 % SODIUM CHLORIDE (POUR BTL) OPTIME
TOPICAL | Status: DC | PRN
  Administered 2012-12-16: 1000 mL

## 2012-12-16 MED ORDER — ATORVASTATIN CALCIUM 40 MG PO TABS
40.0000 mg | ORAL_TABLET | Freq: Every evening | ORAL | Status: DC
  Administered 2012-12-16 – 2012-12-18 (×3): 40 mg via ORAL
  Filled 2012-12-16 (×3): qty 1

## 2012-12-16 MED ORDER — HYDROMORPHONE HCL PF 1 MG/ML IJ SOLN
0.2500 mg | INTRAMUSCULAR | Status: DC | PRN
  Administered 2012-12-16 (×4): 0.5 mg via INTRAVENOUS

## 2012-12-16 MED ORDER — METOCLOPRAMIDE HCL 10 MG PO TABS: 5.0000 mg | ORAL_TABLET | Freq: Three times a day (TID) | ORAL | Status: DC | PRN

## 2012-12-16 MED ORDER — SODIUM CHLORIDE 0.9 % IV SOLN
INTRAVENOUS | Status: DC
  Administered 2012-12-16 – 2012-12-17 (×2): via INTRAVENOUS

## 2012-12-16 MED ORDER — KETOROLAC TROMETHAMINE 15 MG/ML IJ SOLN
INTRAMUSCULAR | Status: AC
  Filled 2012-12-16: qty 1

## 2012-12-16 MED ORDER — CEFAZOLIN SODIUM-DEXTROSE 2-3 GM-% IV SOLR
INTRAVENOUS | Status: AC
  Filled 2012-12-16: qty 50

## 2012-12-16 MED ORDER — HYDROMORPHONE HCL PF 1 MG/ML IJ SOLN
INTRAMUSCULAR | Status: DC | PRN
  Administered 2012-12-16 (×4): 1 mg via INTRAVENOUS

## 2012-12-16 MED ORDER — RIVAROXABAN 10 MG PO TABS
10.0000 mg | ORAL_TABLET | Freq: Every day | ORAL | Status: DC
  Administered 2012-12-17 – 2012-12-18 (×2): 10 mg via ORAL
  Filled 2012-12-16 (×3): qty 1

## 2012-12-16 MED ORDER — OXYCODONE HCL 5 MG PO TABS
5.0000 mg | ORAL_TABLET | ORAL | Status: DC | PRN
  Administered 2012-12-16 – 2012-12-18 (×12): 10 mg via ORAL
  Filled 2012-12-16 (×12): qty 2

## 2012-12-16 MED ORDER — SODIUM CHLORIDE 0.9 % IJ SOLN
INTRAMUSCULAR | Status: DC | PRN
  Administered 2012-12-16: 30 mL via INTRAVENOUS

## 2012-12-16 MED ORDER — METHOCARBAMOL 100 MG/ML IJ SOLN
500.0000 mg | Freq: Four times a day (QID) | INTRAMUSCULAR | Status: DC | PRN
  Administered 2012-12-16 – 2012-12-17 (×3): 500 mg via INTRAVENOUS
  Filled 2012-12-16 (×3): qty 5

## 2012-12-16 MED ORDER — DOCUSATE SODIUM 100 MG PO CAPS
100.0000 mg | ORAL_CAPSULE | Freq: Two times a day (BID) | ORAL | Status: DC
  Administered 2012-12-17 – 2012-12-18 (×3): 100 mg via ORAL

## 2012-12-16 MED ORDER — CHLORHEXIDINE GLUCONATE 4 % EX LIQD
60.0000 mL | Freq: Once | CUTANEOUS | Status: DC
Start: 2012-12-16 — End: 2012-12-16
  Filled 2012-12-16: qty 60

## 2012-12-16 MED ORDER — ACETAMINOPHEN 500 MG PO TABS: 1000.0000 mg | ORAL_TABLET | Freq: Four times a day (QID) | ORAL | Status: DC

## 2012-12-16 MED ORDER — ONDANSETRON HCL 4 MG/2ML IJ SOLN: 4.0000 mg | Freq: Four times a day (QID) | INTRAMUSCULAR | Status: DC | PRN

## 2012-12-16 MED ORDER — ACETAMINOPHEN 500 MG PO TABS
1000.0000 mg | ORAL_TABLET | Freq: Four times a day (QID) | ORAL | Status: AC
  Administered 2012-12-16 – 2012-12-17 (×3): 1000 mg via ORAL
  Filled 2012-12-16 (×4): qty 2

## 2012-12-16 MED ORDER — PAROXETINE HCL 20 MG PO TABS
20.0000 mg | ORAL_TABLET | ORAL | Status: DC
  Administered 2012-12-16 – 2012-12-18 (×3): 20 mg via ORAL
  Filled 2012-12-16 (×3): qty 1

## 2012-12-16 MED ORDER — MENTHOL 3 MG MT LOZG: 1.0000 | LOZENGE | OROMUCOSAL | Status: DC | PRN

## 2012-12-16 MED ORDER — CEFAZOLIN SODIUM 1-5 GM-% IV SOLN
1.0000 g | Freq: Four times a day (QID) | INTRAVENOUS | Status: AC
  Administered 2012-12-16 (×2): 1 g via INTRAVENOUS
  Filled 2012-12-16 (×2): qty 50

## 2012-12-16 MED ORDER — PROMETHAZINE HCL 25 MG/ML IJ SOLN: 6.2500 mg | INTRAMUSCULAR | Status: DC | PRN

## 2012-12-16 MED ORDER — ACETAMINOPHEN 500 MG PO TABS
1000.0000 mg | ORAL_TABLET | Freq: Once | ORAL | Status: AC
  Administered 2012-12-16: 1000 mg via ORAL
  Filled 2012-12-16: qty 2

## 2012-12-16 MED ORDER — DEXAMETHASONE SODIUM PHOSPHATE 10 MG/ML IJ SOLN
10.0000 mg | Freq: Once | INTRAMUSCULAR | Status: AC
  Administered 2012-12-16: 10 mg via INTRAVENOUS

## 2012-12-16 MED ORDER — FLEET ENEMA 7-19 GM/118ML RE ENEM: 1.0000 | ENEMA | Freq: Once | RECTAL | Status: AC | PRN

## 2012-12-16 MED ORDER — LIDOCAINE HCL (CARDIAC) 20 MG/ML IV SOLN
INTRAVENOUS | Status: DC | PRN
  Administered 2012-12-16: 100 mg via INTRAVENOUS

## 2012-12-16 MED ORDER — DIPHENHYDRAMINE HCL 12.5 MG/5ML PO ELIX: 12.5000 mg | ORAL_SOLUTION | ORAL | Status: DC | PRN

## 2012-12-16 MED ORDER — MIDAZOLAM HCL 5 MG/5ML IJ SOLN
INTRAMUSCULAR | Status: DC | PRN
  Administered 2012-12-16: 2 mg via INTRAVENOUS

## 2012-12-16 MED ORDER — ONDANSETRON HCL 4 MG/2ML IJ SOLN
INTRAMUSCULAR | Status: DC | PRN
  Administered 2012-12-16: 4 mg via INTRAMUSCULAR

## 2012-12-16 MED ORDER — SUCCINYLCHOLINE CHLORIDE 20 MG/ML IJ SOLN
INTRAMUSCULAR | Status: DC | PRN
  Administered 2012-12-16: 100 mg via INTRAVENOUS

## 2012-12-16 MED ORDER — METHOCARBAMOL 500 MG PO TABS
500.0000 mg | ORAL_TABLET | Freq: Four times a day (QID) | ORAL | Status: DC | PRN
  Administered 2012-12-17 – 2012-12-18 (×5): 500 mg via ORAL
  Filled 2012-12-16 (×5): qty 1

## 2012-12-16 SURGICAL SUPPLY — 57 items
BAG ZIPLOCK 12X15 (MISCELLANEOUS) ×2 IMPLANT
BANDAGE ELASTIC 6 VELCRO ST LF (GAUZE/BANDAGES/DRESSINGS) ×2 IMPLANT
BANDAGE ESMARK 6X9 LF (GAUZE/BANDAGES/DRESSINGS) ×1 IMPLANT
BLADE SAG 18X100X1.27 (BLADE) ×2 IMPLANT
BLADE SAW SGTL 11.0X1.19X90.0M (BLADE) ×2 IMPLANT
BNDG ESMARK 6X9 LF (GAUZE/BANDAGES/DRESSINGS) ×2
BOWL SMART MIX CTS (DISPOSABLE) ×2 IMPLANT
CAPT RP KNEE ×2 IMPLANT
CEMENT HV SMART SET (Cement) ×4 IMPLANT
CLOSURE STERI-STRIP 1/4X4 (GAUZE/BANDAGES/DRESSINGS) ×2 IMPLANT
CLOTH BEACON ORANGE TIMEOUT ST (SAFETY) ×2 IMPLANT
CUFF TOURN SGL QUICK 34 (TOURNIQUET CUFF) ×1
CUFF TRNQT CYL 34X4X40X1 (TOURNIQUET CUFF) ×1 IMPLANT
DECANTER SPIKE VIAL GLASS SM (MISCELLANEOUS) ×2 IMPLANT
DRAPE EXTREMITY T 121X128X90 (DRAPE) ×2 IMPLANT
DRAPE POUCH INSTRU U-SHP 10X18 (DRAPES) ×2 IMPLANT
DRAPE U-SHAPE 47X51 STRL (DRAPES) ×2 IMPLANT
DRSG ADAPTIC 3X8 NADH LF (GAUZE/BANDAGES/DRESSINGS) ×2 IMPLANT
DRSG PAD ABDOMINAL 8X10 ST (GAUZE/BANDAGES/DRESSINGS) ×2 IMPLANT
DURAPREP 26ML APPLICATOR (WOUND CARE) ×2 IMPLANT
ELECT REM PT RETURN 9FT ADLT (ELECTROSURGICAL) ×2
ELECTRODE REM PT RTRN 9FT ADLT (ELECTROSURGICAL) ×1 IMPLANT
EVACUATOR 1/8 PVC DRAIN (DRAIN) ×2 IMPLANT
FACESHIELD LNG OPTICON STERILE (SAFETY) ×10 IMPLANT
GLOVE BIO SURGEON STRL SZ7.5 (GLOVE) IMPLANT
GLOVE BIO SURGEON STRL SZ8 (GLOVE) ×2 IMPLANT
GLOVE BIOGEL PI IND STRL 8 (GLOVE) ×2 IMPLANT
GLOVE BIOGEL PI INDICATOR 8 (GLOVE) ×2
GLOVE SURG SS PI 6.5 STRL IVOR (GLOVE) IMPLANT
GOWN PREVENTION PLUS LG XLONG (DISPOSABLE) ×2 IMPLANT
GOWN STRL REIN XL XLG (GOWN DISPOSABLE) IMPLANT
HANDPIECE INTERPULSE COAX TIP (DISPOSABLE) ×1
IMMOBILIZER KNEE 20 (SOFTGOODS) ×2
IMMOBILIZER KNEE 20 THIGH 36 (SOFTGOODS) ×1 IMPLANT
KIT BASIN OR (CUSTOM PROCEDURE TRAY) ×2 IMPLANT
MANIFOLD NEPTUNE II (INSTRUMENTS) ×2 IMPLANT
NDL SAFETY ECLIPSE 18X1.5 (NEEDLE) ×2 IMPLANT
NEEDLE HYPO 18GX1.5 SHARP (NEEDLE) ×2
NS IRRIG 1000ML POUR BTL (IV SOLUTION) ×2 IMPLANT
PACK TOTAL JOINT (CUSTOM PROCEDURE TRAY) ×2 IMPLANT
PAD ABD 7.5X8 STRL (GAUZE/BANDAGES/DRESSINGS) ×2 IMPLANT
PADDING CAST COTTON 6X4 STRL (CAST SUPPLIES) ×2 IMPLANT
POSITIONER SURGICAL ARM (MISCELLANEOUS) ×2 IMPLANT
SET HNDPC FAN SPRY TIP SCT (DISPOSABLE) ×1 IMPLANT
SPONGE GAUZE 4X4 12PLY (GAUZE/BANDAGES/DRESSINGS) ×2 IMPLANT
STRIP CLOSURE SKIN 1/2X4 (GAUZE/BANDAGES/DRESSINGS) ×4 IMPLANT
SUCTION FRAZIER 12FR DISP (SUCTIONS) ×2 IMPLANT
SUT MNCRL AB 4-0 PS2 18 (SUTURE) ×2 IMPLANT
SUT VIC AB 2-0 CT1 27 (SUTURE) ×3
SUT VIC AB 2-0 CT1 TAPERPNT 27 (SUTURE) ×3 IMPLANT
SUT VLOC 180 0 24IN GS25 (SUTURE) ×2 IMPLANT
SYR 20CC LL (SYRINGE) ×2 IMPLANT
SYR 50ML LL SCALE MARK (SYRINGE) ×2 IMPLANT
TOWEL OR 17X26 10 PK STRL BLUE (TOWEL DISPOSABLE) ×4 IMPLANT
TRAY FOLEY CATH 14FRSI W/METER (CATHETERS) ×2 IMPLANT
WATER STERILE IRR 1500ML POUR (IV SOLUTION) ×2 IMPLANT
WRAP KNEE MAXI GEL POST OP (GAUZE/BANDAGES/DRESSINGS) ×2 IMPLANT

## 2012-12-16 NOTE — Interval H&P Note (Signed)
History and Physical Interval Note:  12/16/2012 6:57 AM  [name]  has presented today for surgery, with the diagnosis of LEFT KNEE OSTEOARTHRITIS  The various methods of treatment have been discussed with the patient and family. After consideration of risks, benefits and other options for treatment, the patient has consented to  Procedure(s): LEFT TOTAL KNEE ARTHROPLASTY (Left) as a surgical intervention .  The patient's history has been reviewed, patient examined, no change in status, stable for surgery.  I have reviewed the patient's chart and labs.  Questions were answered to the patient's satisfaction.     Loanne Drilling

## 2012-12-16 NOTE — Anesthesia Postprocedure Evaluation (Signed)
Anesthesia Post Note  Patient: [name]  Procedure(s) Performed: Procedure(s) (LRB): LEFT TOTAL KNEE ARTHROPLASTY (Left)  Anesthesia type: Spinal  Patient location: PACU  Post pain: Pain level controlled  Post assessment: Post-op Vital signs reviewed  Last Vitals:  Filed Vitals:   12/16/12 1500  BP: 127/84  Pulse: 73  Temp: 36.7 C  Resp: 18    Post vital signs: Reviewed  Level of consciousness: sedated  Complications: No apparent anesthesia complications

## 2012-12-16 NOTE — Evaluation (Signed)
Physical Therapy Evaluation Patient Details Name: [name] MRN: [id number] DOB: 08-07-1950 Today's Date: 12/16/2012 Time: 7829-5621 PT Time Calculation (min): 21 min  PT Assessment / Plan / Recommendation History of Present Illness  62 yo male s/p L TKA 10/13-POD 0. Hx of sciatica, HTN, depression, DVT, CVA.   Clinical Impression  On eval POD 0, pt required Min assist for mobility-able to ambulate ~25 feet with RW. Anticipate pt will progress well during stay. Recommend HHPT and 24 hour supervision initially.     PT Assessment  Patient needs continued PT services    Follow Up Recommendations  Home health PT    Does the patient have the potential to tolerate intense rehabilitation      Barriers to Discharge        Equipment Recommendations  None recommended by PT    Recommendations for Other Services OT consult   Frequency 7X/week    Precautions / Restrictions Precautions Precautions: Fall;Knee Required Braces or Orthoses: Knee Immobilizer - Left Knee Immobilizer - Left: Discontinue once straight leg raise with < 10 degree lag Restrictions Weight Bearing Restrictions: No LLE Weight Bearing: Weight bearing as tolerated   Pertinent Vitals/Pain L knee 7/10. Ice applied end of session      Mobility  Bed Mobility Bed Mobility: Supine to Sit Supine to Sit: 4: Min assist;HOB elevated;With rails Details for Bed Mobility Assistance: Assist for L LE off bed.  Transfers Transfers: Sit to Stand;Stand to Sit Sit to Stand: 4: Min assist;From bed Stand to Sit: 4: Min assist;To bed Details for Transfer Assistance: VCs safety, technique, hand placement. Assist to rise, stabilize, control descent Ambulation/Gait Ambulation/Gait Assistance: 4: Min assist Ambulation Distance (Feet): 25 Feet Assistive device: Rolling walker Ambulation/Gait Assistance Details: Assist to stabilize throughout ambulation. VCs safety, technique, sequence. Slow gait speed. Pt mildly shaky.   Gait Pattern: Step-to pattern;Decreased stride length;Decreased step length - left    Exercises     PT Diagnosis: Difficulty walking;Abnormality of gait;Generalized weakness;Acute pain  PT Problem List: Decreased strength;Decreased range of motion;Decreased mobility;Pain;Decreased knowledge of precautions;Decreased knowledge of use of DME PT Treatment Interventions: DME instruction;Gait training;Stair training;Functional mobility training;Therapeutic activities;Therapeutic exercise;Patient/family education     PT Goals(Current goals can be found in the care plan section) Acute Rehab PT Goals Patient Stated Goal: home. regain independence PT Goal Formulation: With patient Time For Goal Achievement: 12/30/12 Potential to Achieve Goals: Good  Visit Information  Last PT Received On: 12/16/12 Assistance Needed: +1 History of Present Illness: 62 yo male s/p L TKA 10/13-POD 0. Hx of sciatica, HTN, depression, DVT, CVA.        Prior Functioning  Home Living Family/patient expects to be discharged to:: Private residence Living Arrangements: Other relatives Available Help at Discharge: Family Type of Home: House Home Access: Stairs to enter Secretary/administrator of Steps: 3 Entrance Stairs-Rails: Right;Left Home Layout: One level Home Equipment: Environmental consultant - 2 wheels;Cane - single point;Crutches;Bedside commode Prior Function Level of Independence: Independent Communication Communication: No difficulties    Cognition  Cognition Arousal/Alertness: Awake/alert Behavior During Therapy: WFL for tasks assessed/performed Overall Cognitive Status: Within Functional Limits for tasks assessed    Extremity/Trunk Assessment Upper Extremity Assessment Upper Extremity Assessment: Overall WFL for tasks assessed Lower Extremity Assessment Lower Extremity Assessment: LLE deficits/detail LLE Deficits / Details: hip flex 2/5, hip abd/add 2/5, moves ankle well Cervical / Trunk  Assessment Cervical / Trunk Assessment: Normal   Balance    End of Session PT - End of Session Equipment Utilized  During Treatment: Gait belt Patient left: with call bell/phone within reach;in bed (reclined in chair) CPM Left Knee CPM Left Knee: Off  GP     [name] Alert, MPT Pager: [phone]

## 2012-12-16 NOTE — Anesthesia Preprocedure Evaluation (Signed)
Anesthesia Evaluation  Patient identified by MRN, date of birth, ID band Patient awake    Reviewed: Allergy & Precautions, H&P , NPO status , Patient's Chart, lab work & pertinent test results  Airway Mallampati: III TM Distance: >3 FB Neck ROM: Full    Dental  (+) Teeth Intact and Dental Advisory Given   Pulmonary neg pulmonary ROS,  breath sounds clear to auscultation  Pulmonary exam normal       Cardiovascular hypertension, Pt. on medications Rate:Normal     Neuro/Psych Depression Spinal fusion with rods  Neuromuscular disease negative psych ROS   GI/Hepatic negative GI ROS, Neg liver ROS,   Endo/Other  Hypothyroidism Morbid obesity  Renal/GU negative Renal ROS   Prostate CA/Prostatectomy negative genitourinary   Musculoskeletal negative musculoskeletal ROS (+)   Abdominal   Peds  Hematology negative hematology ROS (+)   Anesthesia Other Findings   Reproductive/Obstetrics                           Anesthesia Physical Anesthesia Plan  ASA: III  Anesthesia Plan: General   Post-op Pain Management:    Induction: Intravenous  Airway Management Planned: Oral ETT  Additional Equipment:   Intra-op Plan:   Post-operative Plan: Extubation in OR  Informed Consent: I have reviewed the patients History and Physical, chart, labs and discussed the procedure including the risks, benefits and alternatives for the proposed anesthesia with the patient or authorized representative who has indicated his/her understanding and acceptance.   Dental advisory given  Plan Discussed with: CRNA  Anesthesia Plan Comments:         Anesthesia Quick Evaluation

## 2012-12-16 NOTE — Op Note (Signed)
Pre-operative diagnosis- Osteoarthritis  Left knee(s)  Post-operative diagnosis- Osteoarthritis Left knee(s)  Procedure-  Left  Total Knee Arthroplasty  Surgeon- Gus Rankin. Seibert Keeter, MD  Assistant- Dimitri Ped, PA-C   Anesthesia-  General EBL-* No blood loss amount entered *  Drains Hemovac  Tourniquet time-  Total Tourniquet Time Documented: Thigh (Left) - 40 minutes Total: Thigh (Left) - 40 minutes    Complications- None  Condition-PACU - hemodynamically stable.   Brief Clinical Note    [name] is a 62 y.o. year old male with end stage OA of his left knee with progressively worsening pain and dysfunction. He has constant pain, with activity and at rest and significant functional deficits with difficulties even with ADLs. He has had extensive non-op management including analgesics, injections of cortisone and viscosupplements, and home exercise program, but remains in significant pain with significant dysfunction. Radiographs show bone on bone arthritis medial and patellofemoral. He presents now for left Total Knee Arthroplasty.      Procedure in detail---   The patient is brought into the operating room and positioned supine on the operating table. After successful administration of  General,   a tourniquet is placed high on the  Left thigh(s) and the lower extremity is prepped and draped in the usual sterile fashion. Time out is performed by the operating team and then the  Left lower extremity is wrapped in Esmarch, knee flexed and the tourniquet inflated to 300 mmHg.       A midline incision is made with a ten blade through the subcutaneous tissue to the level of the extensor mechanism. A fresh blade is used to make a medial parapatellar arthrotomy. Soft tissue over the proximal medial tibia is subperiosteally elevated to the joint line with a knife and into the semimembranosus bursa with a Cobb elevator. Soft tissue over the proximal lateral tibia is elevated with  attention being paid to avoiding the patellar tendon on the tibial tubercle. The patella is everted, knee flexed 90 degrees and the ACL and PCL are removed. Findings are bone on bone all 3 compartments with massive global osteophytes.        The drill is used to create a starting hole in the distal femur and the canal is thoroughly irrigated with sterile saline to remove the fatty contents. The 5 degree Left  valgus alignment guide is placed into the femoral canal and the distal femoral cutting block is pinned to remove 10 mm off the distal femur. Resection is made with an oscillating saw.      The tibia is subluxed forward and the menisci are removed. The extramedullary alignment guide is placed referencing proximally at the medial aspect of the tibial tubercle and distally along the second metatarsal axis and tibial crest. The block is pinned to remove 2mm off the more deficient medial  side. Resection is made with an oscillating saw. Size 5is the most appropriate size for the tibia and the proximal tibia is prepared with the modular drill and keel punch for that size.      The femoral sizing guide is placed and size 5 is most appropriate. Rotation is marked off the epicondylar axis and confirmed by creating a rectangular flexion gap at 90 degrees. The size 5 cutting block is pinned in this rotation and the anterior, posterior and chamfer cuts are made with the oscillating saw. The intercondylar block is then placed and that cut is made.      Trial size 5 tibial  component, trial size 5 posterior stabilized femur and a 12.5  mm posterior stabilized rotating platform insert trial is placed. Full extension is achieved with excellent varus/valgus and anterior/posterior balance throughout full range of motion. The patella is everted and thickness measured to be 27  mm. Free hand resection is taken to 15 mm, a 41 template is placed, lug holes are drilled, trial patella is placed, and it tracks normally. Osteophytes  are removed off the posterior femur with the trial in place. All trials are removed and the cut bone surfaces prepared with pulsatile lavage. Cement is mixed and once ready for implantation, the size 5 tibial implant, size  5 posterior stabilized femoral component, and the size 41 patella are cemented in place and the patella is held with the clamp. The trial insert is placed and the knee held in full extension. The Exparel (20 ml mixed with 30 ml saline) and .25% Bupivicaine, are injected into the extensor mechanism, posterior capsule, medial and lateral gutters and subcutaneous tissues.  All extruded cement is removed and once the cement is hard the permanent 12.5 mm posterior stabilized rotating platform insert is placed into the tibial tray.      The wound is copiously irrigated with saline solution and the extensor mechanism closed over a hemovac drain with #1 PDS suture. The tourniquet is released for a total tourniquet time of 40  minutes. Flexion against gravity is 140 degrees and the patella tracks normally. Subcutaneous tissue is closed with 2.0 vicryl and subcuticular with running 4.0 Monocryl. The incision is cleaned and dried and steri-strips and a bulky sterile dressing are applied. The limb is placed into a knee immobilizer and the patient is awakened and transported to recovery in stable condition.      Please note that a surgical assistant was a medical necessity for this procedure in order to perform it in a safe and expeditious manner. Surgical assistant was necessary to retract the ligaments and vital neurovascular structures to prevent injury to them and also necessary for proper positioning of the limb to allow for anatomic placement of the prosthesis.   Gus Rankin Melven Stockard, MD    12/16/2012, 10:25 AM

## 2012-12-16 NOTE — Transfer of Care (Signed)
Immediate Anesthesia Transfer of Care Note  Patient: [name]  Procedure(s) Performed: Procedure(s) (LRB): LEFT TOTAL KNEE ARTHROPLASTY (Left)  Patient Location: PACU  Anesthesia Type: General  Level of Consciousness: sedated, patient cooperative and responds to stimulation  Airway & Oxygen Therapy: Patient Spontanous Breathing and Patient connected to face mask oxgen  Post-op Assessment: Report given to PACU RN and Post -op Vital signs reviewed and stable  Post vital signs: Reviewed and stable  Complications: No apparent anesthesia complications

## 2012-12-17 ENCOUNTER — Encounter (HOSPITAL_COMMUNITY): Payer: Self-pay | Admitting: Orthopedic Surgery

## 2012-12-17 LAB — CBC
Hemoglobin: 11.3 g/dL — ABNORMAL LOW (ref 13.0–17.0)
MCH: 31 pg (ref 26.0–34.0)
Platelets: 245 10*3/uL (ref 150–400)
RBC: 3.64 MIL/uL — ABNORMAL LOW (ref 4.22–5.81)
WBC: 14.5 10*3/uL — ABNORMAL HIGH (ref 4.0–10.5)

## 2012-12-17 LAB — BASIC METABOLIC PANEL
CO2: 26 mEq/L (ref 19–32)
Calcium: 8.8 mg/dL (ref 8.4–10.5)
GFR calc Af Amer: >90 mL/min (ref >90)
Glucose, Bld: 184 mg/dL — ABNORMAL HIGH (ref 70–99)
Potassium: 4.2 mEq/L (ref 3.5–5.1)
Sodium: 133 mEq/L — ABNORMAL LOW (ref 135–145)

## 2012-12-17 NOTE — Progress Notes (Signed)
Physical Therapy Treatment Patient Details Name: [name] MRN: [id number] DOB: [birth year] Today's Date: 12/17/2012 Time: 4098-1191 PT Time Calculation (min): 15 min  PT Assessment / Plan / Recommendation  History of Present Illness 62 yo male s/p L TKA 10/13-POD 0. Hx of sciatica, HTN, depression, DVT, CVA.    PT Comments   Progressing with mobility. Increased pain this session. Plan is for d/c home tomorrow.   Follow Up Recommendations  Home health PT     Does the patient have the potential to tolerate intense rehabilitation     Barriers to Discharge        Equipment Recommendations  Rolling walker with 5" wheels    Recommendations for Other Services OT consult  Frequency 7X/week   Progress towards PT Goals Progress towards PT goals: Progressing toward goals  Plan Current plan remains appropriate    Precautions / Restrictions Precautions Precautions: Knee;Fall Required Braces or Orthoses: Knee Immobilizer - Left Knee Immobilizer - Left: Discontinue once straight leg raise with < 10 degree lag Restrictions Weight Bearing Restrictions: No LLE Weight Bearing: Weight bearing as tolerated   Pertinent Vitals/Pain 10/10 L knee with activity. Ice applied end of session    Mobility  Bed Mobility Bed Mobility: Supine to Sit;Sit to Supine Supine to Sit: 4: Min guard Sit to Supine: 4: Min guard Details for Bed Mobility Assistance: cues for hand placement Transfers Transfers: Sit to Stand;Stand to Sit Sit to Stand: 4: Min guard;From bed Stand to Sit: 4: Min guard;To bed Details for Transfer Assistance: vcs for hand and leg placement Ambulation/Gait Ambulation/Gait Assistance: 4: Min guard Ambulation Distance (Feet): 75 Feet Assistive device: Rolling walker Ambulation/Gait Assistance Details: close guard for safety. Increased pain this session Gait Pattern: Step-to pattern;Decreased stride length;Decreased step length - left    Exercises     PT Diagnosis:    PT  Problem List:   PT Treatment Interventions:     PT Goals (current goals can now be found in the care plan section)    Visit Information  Last PT Received On: 12/17/12 Assistance Needed: +1 History of Present Illness: 62 yo male s/p L TKA 10/13-POD 0. Hx of sciatica, HTN, depression, DVT, CVA.     Subjective Data      Cognition  Cognition Arousal/Alertness: Awake/alert Behavior During Therapy: WFL for tasks assessed/performed Overall Cognitive Status: Within Functional Limits for tasks assessed    Balance     End of Session PT - End of Session Activity Tolerance: Patient tolerated treatment well Patient left: in bed;with call bell/phone within reach   GP     [name] Alert, MPT Pager: [phone]

## 2012-12-17 NOTE — Progress Notes (Signed)
I have reviewed this note and agree with all findings. Kati Thaxton Pelley, PT, DPT Pager: [phone]   

## 2012-12-17 NOTE — Progress Notes (Signed)
   Subjective: 1 Day Post-Op Procedure(s) (LRB): LEFT TOTAL KNEE ARTHROPLASTY (Left) Patient reports pain as mild.   Patient seen in rounds with Dr. Lequita Halt. Patient is well, and has had no acute complaints or problems We will start therapy today.  Plan is to go Home after hospital stay.  Objective: Vital signs in last 24 hours: Temp:  [97.5 F (36.4 C)-99.5 F (37.5 C)] 97.8 F (36.6 C) (10/14 0500) Pulse Rate:  [67-85] 69 (10/14 0500) Resp:  [12-22] 16 (10/14 0500) BP: (112-162)/(77-95) 112/77 mmHg (10/14 0500) SpO2:  [94 %-100 %] 100 % (10/14 0500) FiO2 (%):  [95 %] 95 % (10/13 1200) Weight:  [109.317 kg (241 lb)] 109.317 kg (241 lb) (10/13 1200)  Intake/Output from previous day:  Intake/Output Summary (Last 24 hours) at 12/17/12 0718 Last data filed at 12/17/12 0600  Gross per 24 hour  Intake 4603.75 ml  Output   3705 ml  Net 898.75 ml    Intake/Output this shift:    Labs:  Recent Labs  12/17/12 0516  HGB 11.3*    Recent Labs  12/17/12 0516  WBC 14.5*  RBC 3.64*  HCT 33.9*  PLT 245    Recent Labs  12/17/12 0516  NA 133*  K 4.2  CL 100  CO2 26  BUN 17  CREATININE 0.87  GLUCOSE 184*  CALCIUM 8.8   No results found for this basename: LABPT, INR,  in the last 72 hours  EXAM General - Patient is Alert, Appropriate and Oriented Extremity - Neurovascular intact Sensation intact distally Dorsiflexion/Plantar flexion intact Dressing - dressing C/D/I Motor Function - intact, moving foot and toes well on exam.  Hemovac pulled without difficulty.  Past Medical History  Diagnosis Date  . Allergy   . Depression   . Prostate cancer   . Osteoarthritis   . Insomnia 12-10-12    not a problem now  . Erectile dysfunction   . Hypertension   . Hx of colonic polyps   . Hypothyroidism   . Stroke, lacunar     "mini- episodes of severe hand shaking"    Assessment/Plan: 1 Day Post-Op Procedure(s) (LRB): LEFT TOTAL KNEE ARTHROPLASTY  (Left) Principal Problem:   OA (osteoarthritis) of knee  Estimated body mass index is 35.57 kg/(m^2) as calculated from the following:   Height as of this encounter: 5\' 9"  (1.753 m).   Weight as of this encounter: 109.317 kg (241 lb). Advance diet Up with therapy Plan for discharge tomorrow Discharge home with home health  DVT Prophylaxis - Xarelto Weight-Bearing as tolerated to left leg Foley is out. D/C O2 and Pulse OX and try on Room Air  Echo Propp, Marlowe Sax 12/17/2012, 7:18 AM

## 2012-12-17 NOTE — Progress Notes (Signed)
Utilization review completed.  

## 2012-12-17 NOTE — Evaluation (Signed)
Occupational Therapy Evaluation Patient Details Name: [name] MRN: [id number] DOB: 1951-01-20 Today's Date: 12/17/2012 Time: 4696-2952 OT Time Calculation (min): 21 min  OT Assessment / Plan / Recommendation History of present illness 62 yo male s/p L TKA 10/13-POD 0. Hx of sciatica, HTN, depression, DVT, CVA.    Clinical Impression   Pt was admitted for L TKA.  All education was complete.  Pt does not need any further OT at this time.      OT Assessment  Patient does not need any further OT services    Follow Up Recommendations  No OT follow up    Barriers to Discharge      Equipment Recommendations  None recommended by OT    Recommendations for Other Services    Frequency       Precautions / Restrictions Precautions Required Braces or Orthoses: Knee Immobilizer - Left Knee Immobilizer - Left: Discontinue once straight leg raise with < 10 degree lag Restrictions LLE Weight Bearing: Weight bearing as tolerated   Pertinent Vitals/Pain L knee sore; repositioned and ice applied    ADL  Toilet Transfer: Minimal assistance (for sit to stand; min guard for steps) Toilet Transfer Method: Stand pivot Acupuncturist:  (bed to chair) Equipment Used: Rolling walker Transfers/Ambulation Related to ADLs: pt had visitor come during eval.  Prefers to brush teeth from sitting once visit is over.   ADL Comments: pt is lifting LLE.  Used KI for transfers.  He has reacher at home from back surgery.  He needs overall min A for LB bathing and mod A for LB dressing at this time.  Will have assistance.  Can perform UB adls with set up.  Pt has a seat in tub.  He will sponge bathe until he can get into tub; reviewed tub readiness.  Pt not interested in tub bench.      OT Diagnosis:    OT Problem List:   OT Treatment Interventions:     OT Goals(Current goals can be found in the care plan section)    Visit Information  Last OT Received On: 12/17/12 Assistance Needed:  +1 History of Present Illness: 62 yo male s/p L TKA 10/13-POD 0. Hx of sciatica, HTN, depression, DVT, CVA.        Prior Functioning     Home Living Family/patient expects to be discharged to:: Private residence Living Arrangements: Other relatives Home Equipment: Shower seat;Bedside commode Prior Function Level of Independence: Independent Communication Communication: No difficulties         Vision/Perception     Cognition  Cognition Arousal/Alertness: Awake/alert Behavior During Therapy: WFL for tasks assessed/performed Overall Cognitive Status: Within Functional Limits for tasks assessed    Extremity/Trunk Assessment Upper Extremity Assessment Upper Extremity Assessment: Generalized weakness     Mobility Bed Mobility Bed Mobility: Supine to Sit Supine to Sit: 4: Min assist;HOB elevated Details for Bed Mobility Assistance: cues for hand placement Transfers Sit to Stand: 4: Min assist;From bed Stand to Sit: 4: Min assist;To chair/3-in-1 Details for Transfer Assistance: vcs for hand and leg placement     Exercise     Balance     End of Session OT - End of Session Activity Tolerance: Patient tolerated treatment well Patient left: in chair;with call bell/phone within reach;with family/visitor present  GO     Avanthika Dehnert 12/17/2012, 8:31 AM Marica Otter, OTR/L [phone] 12/17/2012

## 2012-12-17 NOTE — Progress Notes (Addendum)
12/17/12 1538  PT Visit Information  Last PT Received On 12/17/12  Assistance Needed +1  History of Present Illness 62 yo male s/p L TKA 10/13-POD 0. Hx of sciatica, HTN, depression, DVT, CVA.   PT Time Calculation  PT Start Time 1505  PT Stop Time 1536  PT Time Calculation (min) 31 min  Subjective Data  Subjective My pain is better this afternoon.  Precautions  Precautions Knee;Fall  Required Braces or Orthoses Knee Immobilizer - Left  Knee Immobilizer - Left Discontinue once straight leg raise with < 10 degree lag  Restrictions  Weight Bearing Restrictions No  LLE Weight Bearing WBAT  Cognition  Arousal/Alertness Awake/alert  Behavior During Therapy WFL for tasks assessed/performed  Overall Cognitive Status Within Functional Limits for tasks assessed  Bed Mobility  Bed Mobility Supine to Sit;Sit to Supine  Supine to Sit 4: Min guard  Sit to Supine 4: Min assist  Details for Bed Mobility Assistance Min guard to ensure safety during supine to sit and min A during sit to supine to assist LLE onto bed due to pain.  Transfers  Transfers Sit to Stand;Stand to Sit  Sit to Stand 4: Min guard;From bed;With upper extremity assist  Stand to Sit 4: Min guard;To bed;With upper extremity assist  Details for Transfer Assistance Min guard to ensure safety.  Ambulation/Gait  Ambulation/Gait Assistance 4: Min guard  Ambulation Distance (Feet) 150 Feet  Assistive device Rolling walker  Ambulation/Gait Assistance Details Min guard to ensure safety as pt reports pain increased from 5/10 at rest to 7/10 during amb.  Gait Pattern Step-to pattern;Decreased stride length;Decreased step length - left  Gait velocity decreased  Exercises  Exercises Total Joint  Total Joint Exercises  Ankle Circles/Pumps 20 reps;Both;AROM;Supine  Quad Sets AROM;Left;20 reps;Supine  Short Arc Quad AROM;20 reps;Right;Supine  Heel Slides AROM;AAROM;20 reps;Supine;Both (AAROM for LLE)  Hip ABduction/ADduction  AROM;AAROM;Both;20 reps;Supine (AAROM for LLE)  PT - End of Session  Equipment Utilized During Treatment Left knee immobilizer  Activity Tolerance Patient limited by pain  Patient left in bed;with call bell/phone within reach;Other (comment) (ortho tech in room to [employer])  PT - Assessment/Plan  PT Plan Current plan remains appropriate  PT Frequency 7X/week  Follow Up Recommendations Home health PT;Supervision for mobility/OOB  PT equipment Rolling walker with 5" wheels  PT Goal Progression  Progress towards PT goals Progressing toward goals

## 2012-12-18 LAB — BASIC METABOLIC PANEL
CO2: 28 mEq/L (ref 19–32)
Chloride: 104 mEq/L (ref 96–112)
Creatinine, Ser: 0.79 mg/dL (ref 0.50–1.35)
GFR calc Af Amer: >90 mL/min (ref >90)
Glucose, Bld: 166 mg/dL — ABNORMAL HIGH (ref 70–99)
Potassium: 4.6 mEq/L (ref 3.5–5.1)
Sodium: 137 mEq/L (ref 135–145)

## 2012-12-18 LAB — CBC
Hemoglobin: 10.5 g/dL — ABNORMAL LOW (ref 13.0–17.0)
MCV: 92.3 fL (ref 78.0–100.0)
Platelets: 225 10*3/uL (ref 150–400)
RBC: 3.38 MIL/uL — ABNORMAL LOW (ref 4.22–5.81)
RDW: 14.1 % (ref 11.5–15.5)
WBC: 16.7 10*3/uL — ABNORMAL HIGH (ref 4.0–10.5)

## 2012-12-18 MED ORDER — METHOCARBAMOL 500 MG PO TABS: 500.0000 mg | ORAL_TABLET | Freq: Four times a day (QID) | ORAL | Status: DC | PRN

## 2012-12-18 MED ORDER — OXYCODONE HCL 5 MG PO TABS: 5.0000 mg | ORAL_TABLET | ORAL | Status: DC | PRN

## 2012-12-18 MED ORDER — RIVAROXABAN 10 MG PO TABS: 10.0000 mg | ORAL_TABLET | Freq: Every day | ORAL | Status: DC

## 2012-12-18 NOTE — Progress Notes (Signed)
Physical Therapy Treatment Patient Details Name: [name] MRN: [id number] DOB: [birth year] Today's Date: 12/18/2012 Time: 1340-1400 PT Time Calculation (min): 20 min  PT Assessment / Plan / Recommendation  History of Present Illness 62 yo male s/p L TKA 10/13-POD 0. Hx of sciatica, HTN, depression, DVT, CVA.    PT Comments   Progressing well with mobility. 2nd session to practice steps. All education completed. Ready to d/c home from PT standpoint-pt ready as well. RN made aware. Recommend HHPT.   Follow Up Recommendations  Home health PT     Does the patient have the potential to tolerate intense rehabilitation     Barriers to Discharge        Equipment Recommendations  Rolling walker with 5" wheels    Recommendations for Other Services    Frequency 7X/week   Progress towards PT Goals Progress towards PT goals: Progressing toward goals  Plan Current plan remains appropriate    Precautions / Restrictions Precautions Precautions: Knee Required Braces or Orthoses: Knee Immobilizer - Left Knee Immobilizer - Left: Discontinue once straight leg raise with < 10 degree lag Restrictions Weight Bearing Restrictions: No LLE Weight Bearing: Weight bearing as tolerated   Pertinent Vitals/Pain 5/10 L knee with activity. Ice applied end of session    Mobility  Bed Mobility Bed Mobility: Supine to Sit;Sit to Supine Supine to Sit: 4: Min guard Sit to Supine: 4: Min guard Transfers Transfers: Sit to Stand;Stand to Sit Sit to Stand: 4: Min guard Stand to Sit: 4: Min guard Ambulation/Gait Ambulation/Gait Assistance: 4: Min guard Ambulation Distance (Feet): 125 Feet Assistive device: Rolling walker Ambulation/Gait Assistance Details: close guard for safety Gait Pattern: Step-to pattern;Decreased stride length;Decreased step length - left;Antalgic;Step-through pattern Stairs: Yes Stairs Assistance: 4: Min assist Stairs Assistance Details (indicate cue type and reason): VCs  safety, technique, sequence. Assist to stabilize.  Stair Management Technique: One rail Right;With crutches;Forwards;Step to pattern Number of Stairs: 2    Exercises Total Joint Exercises Ankle Circles/Pumps: AROM;Both;10 reps;Supine Quad Sets: AROM;Left;10 reps;Supine Heel Slides: AAROM;10 reps;Left;Supine Hip ABduction/ADduction: AROM;Left;10 reps;Supine Straight Leg Raises: AROM;Left;10 reps;Supine   PT Diagnosis:    PT Problem List:   PT Treatment Interventions:     PT Goals (current goals can now be found in the care plan section)    Visit Information  Last PT Received On: 12/18/12 Assistance Needed: +1 History of Present Illness: 62 yo male s/p L TKA 10/13-POD 0. Hx of sciatica, HTN, depression, DVT, CVA.     Subjective Data      Cognition  Cognition Arousal/Alertness: Awake/alert Behavior During Therapy: WFL for tasks assessed/performed Overall Cognitive Status: Within Functional Limits for tasks assessed    Balance     End of Session PT - End of Session Equipment Utilized During Treatment: Left knee immobilizer Activity Tolerance: Patient tolerated treatment well Patient left: in bed;with call bell/phone within reach   GP     [name] Alert, MPT Pager: [phone]

## 2012-12-18 NOTE — Progress Notes (Signed)
   Subjective: 2 Days Post-Op Procedure(s) (LRB): LEFT TOTAL KNEE ARTHROPLASTY (Left) Patient reports pain as mild.   Patient seen in rounds with Dr. Lequita Halt. Patient is well, and has had no acute complaints or problems Patient is ready to go home  Objective: Vital signs in last 24 hours: Temp:  [97.7 F (36.5 C)-98.3 F (36.8 C)] 98.3 F (36.8 C) (10/15 1509) Pulse Rate:  [71-99] 99 (10/15 1509) Resp:  [16] 16 (10/15 1509) BP: (126-152)/(86-91) 152/91 mmHg (10/15 1509) SpO2:  [97 %-100 %] 100 % (10/15 1509)  Intake/Output from previous day:  Intake/Output Summary (Last 24 hours) at 12/18/12 1627 Last data filed at 12/18/12 1459  Gross per 24 hour  Intake    720 ml  Output   2625 ml  Net  -1905 ml    Intake/Output this shift: Total I/O In: 480 [P.O.:480] Out: 1000 [Urine:1000]  Labs:  Recent Labs  12/17/12 0516 12/18/12 0535  HGB 11.3* 10.5*    Recent Labs  12/17/12 0516 12/18/12 0535  WBC 14.5* 16.7*  RBC 3.64* 3.38*  HCT 33.9* 31.2*  PLT 245 225    Recent Labs  12/17/12 0516 12/18/12 0535  NA 133* 137  K 4.2 4.6  CL 100 104  CO2 26 28  BUN 17 18  CREATININE 0.87 0.79  GLUCOSE 184* 166*  CALCIUM 8.8 8.8   No results found for this basename: LABPT, INR,  in the last 72 hours  EXAM: General - Patient is Alert, Appropriate and Oriented Extremity - Neurovascular intact Dorsiflexion/Plantar flexion intact Incision - clean, dry, no drainage Motor Function - intact, moving foot and toes well on exam.   Assessment/Plan: 2 Days Post-Op Procedure(s) (LRB): LEFT TOTAL KNEE ARTHROPLASTY (Left) Procedure(s) (LRB): LEFT TOTAL KNEE ARTHROPLASTY (Left) Past Medical History  Diagnosis Date  . Allergy   . Depression   . Prostate cancer   . Osteoarthritis   . Insomnia 12-10-12    not a problem now  . Erectile dysfunction   . Hypertension   . Hx of colonic polyps   . Hypothyroidism   . Stroke, lacunar     "mini- episodes of severe hand shaking"    Principal Problem:   OA (osteoarthritis) of knee  Estimated body mass index is 35.57 kg/(m^2) as calculated from the following:   Height as of this encounter: 5\' 9"  (1.753 m).   Weight as of this encounter: 109.317 kg (241 lb). Up with therapy Discharge home with home health Diet - Cardiac diet Follow up - in 2 weeks Activity - WBAT Disposition - Home Condition Upon Discharge - Good D/C Meds - See DC Summary DVT Prophylaxis - Xarelto  [name] 12/18/2012, 4:27 PM

## 2012-12-18 NOTE — Progress Notes (Signed)
Physical Therapy Treatment Patient Details Name: [name] MRN: [id number] DOB: [birth year] Today's Date: 12/18/2012 Time: 4098-1191 PT Time Calculation (min): 23 min  PT Assessment / Plan / Recommendation  History of Present Illness 62 yo male s/p L TKA 10/13-POD 0. Hx of sciatica, HTN, depression, DVT, CVA.    PT Comments   Progressing with mobility. Plan is for possible d/c home later today. Will see for a 2nd session to practice steps.   Follow Up Recommendations  Home health PT     Does the patient have the potential to tolerate intense rehabilitation     Barriers to Discharge        Equipment Recommendations  Rolling walker with 5" wheels    Recommendations for Other Services    Frequency 7X/week   Progress towards PT Goals Progress towards PT goals: Progressing toward goals  Plan Current plan remains appropriate    Precautions / Restrictions Precautions Precautions: Knee Required Braces or Orthoses: Knee Immobilizer - Left Knee Immobilizer - Left: Discontinue once straight leg raise with < 10 degree lag Restrictions Weight Bearing Restrictions: No LLE Weight Bearing: Weight bearing as tolerated   Pertinent Vitals/Pain L knee 7/10 with activity. Ice applied end of session    Mobility  Bed Mobility Bed Mobility: Supine to Sit;Sit to Supine Supine to Sit: 4: Min guard Sit to Supine: 4: Min guard Transfers Transfers: Sit to Stand;Stand to Sit Sit to Stand: 4: Min guard;From bed Stand to Sit: 4: Min guard;To bed Ambulation/Gait Ambulation/Gait Assistance: 4: Min guard Ambulation Distance (Feet): 135 Feet Assistive device: Rolling walker Ambulation/Gait Assistance Details: close guard for safety Gait Pattern: Step-to pattern;Decreased stride length;Decreased step length - left;Antalgic;Step-through pattern    Exercises Total Joint Exercises Ankle Circles/Pumps: AROM;Both;10 reps;Supine Quad Sets: AROM;Left;10 reps;Supine Heel Slides: AAROM;10  reps;Left;Supine Hip ABduction/ADduction: AROM;Left;10 reps;Supine Straight Leg Raises: AROM;Left;10 reps;Supine   PT Diagnosis:    PT Problem List:   PT Treatment Interventions:     PT Goals (current goals can now be found in the care plan section)    Visit Information  Last PT Received On: 12/18/12 Assistance Needed: +1 History of Present Illness: 62 yo male s/p L TKA 10/13-POD 0. Hx of sciatica, HTN, depression, DVT, CVA.     Subjective Data      Cognition  Cognition Arousal/Alertness: Awake/alert Behavior During Therapy: WFL for tasks assessed/performed Overall Cognitive Status: Within Functional Limits for tasks assessed    Balance     End of Session PT - End of Session Equipment Utilized During Treatment: Left knee immobilizer Activity Tolerance: Patient limited by pain Patient left: in bed;with call bell/phone within reach   GP     [name] Alert, MPT Pager: [phone]

## 2012-12-18 NOTE — Discharge Summary (Signed)
Physician Discharge Summary   Patient ID: [name] MRN: [id number] DOB/AGE: January 22, 1951 62 y.o.  Admit date: 12/16/2012 Discharge date: 12/18/2012  Primary Diagnosis:  Osteoarthritis Left knee(s)  Admission Diagnoses:  Past Medical History  Diagnosis Date  . Allergy   . Depression   . Prostate cancer   . Osteoarthritis   . Insomnia 12-10-12    not a problem now  . Erectile dysfunction   . Hypertension   . Hx of colonic polyps   . Hypothyroidism   . Stroke, lacunar     "mini- episodes of severe hand shaking"   Discharge Diagnoses:   Principal Problem:   OA (osteoarthritis) of knee  Estimated body mass index is 35.57 kg/(m^2) as calculated from the following:   Height as of this encounter: 5\' 9"  (1.753 m).   Weight as of this encounter: 109.317 kg (241 lb).  Procedure:  Procedure(s) (LRB): LEFT TOTAL KNEE ARTHROPLASTY (Left)   Consults: None  HPI: [name] is a 62 y.o. year old male with end stage OA of his left knee with progressively worsening pain and dysfunction. He has constant pain, with activity and at rest and significant functional deficits with difficulties even with ADLs. He has had extensive non-op management including analgesics, injections of cortisone and viscosupplements, and home exercise program, but remains in significant pain with significant dysfunction. Radiographs show bone on bone arthritis medial and patellofemoral. He presents now for left Total Knee Arthroplasty.   Laboratory Data: Admission on 12/16/2012, Discharged on 12/18/2012  Component Date Value Range Status  . WBC 12/17/2012 14.5* 4.0 - 10.5 K/uL Final  . RBC 12/17/2012 3.64* 4.22 - 5.81 MIL/uL Final  . Hemoglobin 12/17/2012 11.3* 13.0 - 17.0 g/dL Final  . HCT 78/29/5621 33.9* 39.0 - 52.0 % Final  . MCV 12/17/2012 93.1  78.0 - 100.0 fL Final  . MCH 12/17/2012 31.0  26.0 - 34.0 pg Final  . MCHC 12/17/2012 33.3  30.0 - 36.0 g/dL Final  . RDW 30/86/5784 13.8  11.5 -  15.5 % Final  . Platelets 12/17/2012 245  150 - 400 K/uL Final  . Sodium 12/17/2012 133* 135 - 145 mEq/L Final  . Potassium 12/17/2012 4.2  3.5 - 5.1 mEq/L Final  . Chloride 12/17/2012 100  96 - 112 mEq/L Final  . CO2 12/17/2012 26  19 - 32 mEq/L Final  . Glucose, Bld 12/17/2012 184* 70 - 99 mg/dL Final  . BUN 69/62/9528 17  6 - 23 mg/dL Final  . Creatinine, Ser 12/17/2012 0.87  0.50 - 1.35 mg/dL Final  . Calcium 41/32/4401 8.8  8.4 - 10.5 mg/dL Final  . GFR calc non Af Amer 12/17/2012 90* >90 mL/min Final  . GFR calc Af Amer 12/17/2012 >90  >90 mL/min Final   Comment: (NOTE)                          The eGFR has been calculated using the CKD EPI equation.                          This calculation has not been validated in all clinical situations.                          eGFR's persistently <90 mL/min signify possible Chronic Kidney  Disease.  . WBC 12/18/2012 16.7* 4.0 - 10.5 K/uL Final  . RBC 12/18/2012 3.38* 4.22 - 5.81 MIL/uL Final  . Hemoglobin 12/18/2012 10.5* 13.0 - 17.0 g/dL Final  . HCT 16/12/9602 31.2* 39.0 - 52.0 % Final  . MCV 12/18/2012 92.3  78.0 - 100.0 fL Final  . MCH 12/18/2012 31.1  26.0 - 34.0 pg Final  . MCHC 12/18/2012 33.7  30.0 - 36.0 g/dL Final  . RDW 54/11/8117 14.1  11.5 - 15.5 % Final  . Platelets 12/18/2012 225  150 - 400 K/uL Final  . Sodium 12/18/2012 137  135 - 145 mEq/L Final  . Potassium 12/18/2012 4.6  3.5 - 5.1 mEq/L Final  . Chloride 12/18/2012 104  96 - 112 mEq/L Final  . CO2 12/18/2012 28  19 - 32 mEq/L Final  . Glucose, Bld 12/18/2012 166* 70 - 99 mg/dL Final  . BUN 14/78/2956 18  6 - 23 mg/dL Final  . Creatinine, Ser 12/18/2012 0.79  0.50 - 1.35 mg/dL Final  . Calcium 21/30/8657 8.8  8.4 - 10.5 mg/dL Final  . GFR calc non Af Amer 12/18/2012 >90  >90 mL/min Final  . GFR calc Af Amer 12/18/2012 >90  >90 mL/min Final   Comment: (NOTE)                          The eGFR has been calculated using the CKD EPI equation.                           This calculation has not been validated in all clinical situations.                          eGFR's persistently <90 mL/min signify possible Chronic Kidney                          Disease.  Hospital Outpatient Visit on 12/10/2012  Component Date Value Range Status  . aPTT 12/10/2012 29  24 - 37 seconds Final  . WBC 12/10/2012 7.3  4.0 - 10.5 K/uL Final  . RBC 12/10/2012 4.64  4.22 - 5.81 MIL/uL Final  . Hemoglobin 12/10/2012 14.5  13.0 - 17.0 g/dL Final  . HCT 84/69/6295 42.8  39.0 - 52.0 % Final  . MCV 12/10/2012 92.2  78.0 - 100.0 fL Final  . MCH 12/10/2012 31.3  26.0 - 34.0 pg Final  . MCHC 12/10/2012 33.9  30.0 - 36.0 g/dL Final  . RDW 28/41/3244 13.8  11.5 - 15.5 % Final  . Platelets 12/10/2012 257  150 - 400 K/uL Final  . Sodium 12/10/2012 139  135 - 145 mEq/L Final  . Potassium 12/10/2012 4.5  3.5 - 5.1 mEq/L Final   Comment: SLIGHT HEMOLYSIS                          HEMOLYSIS AT THIS LEVEL MAY AFFECT RESULT  . Chloride 12/10/2012 101  96 - 112 mEq/L Final  . CO2 12/10/2012 25  19 - 32 mEq/L Final  . Glucose, Bld 12/10/2012 98  70 - 99 mg/dL Final  . BUN 03/08/7251 23  6 - 23 mg/dL Final  . Creatinine, Ser 12/10/2012 0.95  0.50 - 1.35 mg/dL Final  . Calcium 66/44/0347 9.9  8.4 - 10.5 mg/dL Final  . Total Protein 12/10/2012 7.3  6.0 - 8.3 g/dL Final  . Albumin 52/84/1324 4.2  3.5 - 5.2 g/dL Final  . AST 40/12/2723 27  0 - 37 U/L Final  . ALT 12/10/2012 20  0 - 53 U/L Final  . Alkaline Phosphatase 12/10/2012 80  39 - 117 U/L Final  . Total Bilirubin 12/10/2012 0.4  0.3 - 1.2 mg/dL Final  . GFR calc non Af Amer 12/10/2012 87* >90 mL/min Final  . GFR calc Af Amer 12/10/2012 >90  >90 mL/min Final   Comment: (NOTE)                          The eGFR has been calculated using the CKD EPI equation.                          This calculation has not been validated in all clinical situations.                          eGFR's persistently <90 mL/min signify  possible Chronic Kidney                          Disease.  Marland Kitchen Prothrombin Time 12/10/2012 12.0  11.6 - 15.2 seconds Final  . INR 12/10/2012 0.90  0.00 - 1.49 Final  . ABO/RH(D) 12/10/2012 A POS   Final  . Antibody Screen 12/10/2012 NEG   Final  . Sample Expiration 12/10/2012 12/19/2012   Final  . Color, Urine 12/10/2012 YELLOW  YELLOW Final  . APPearance 12/10/2012 CLEAR  CLEAR Final  . Specific Gravity, Urine 12/10/2012 1.020  1.005 - 1.030 Final  . pH 12/10/2012 7.5  5.0 - 8.0 Final  . Glucose, UA 12/10/2012 NEGATIVE  NEGATIVE mg/dL Final  . Hgb urine dipstick 12/10/2012 NEGATIVE  NEGATIVE Final  . Bilirubin Urine 12/10/2012 NEGATIVE  NEGATIVE Final  . Ketones, ur 12/10/2012 NEGATIVE  NEGATIVE mg/dL Final  . Protein, ur 36/64/4034 NEGATIVE  NEGATIVE mg/dL Final  . Urobilinogen, UA 12/10/2012 0.2  0.0 - 1.0 mg/dL Final  . Nitrite 74/25/9563 NEGATIVE  NEGATIVE Final  . Leukocytes, UA 12/10/2012 NEGATIVE  NEGATIVE Final   MICROSCOPIC NOT DONE ON URINES WITH NEGATIVE PROTEIN, BLOOD, LEUKOCYTES, NITRITE, OR GLUCOSE <1000 mg/dL.  Marland Kitchen MRSA, PCR 12/10/2012 NEGATIVE  NEGATIVE Final  . Staphylococcus aureus 12/10/2012 POSITIVE* NEGATIVE Final   Comment:                                 The Xpert SA Assay (FDA                          approved for NASAL specimens                          in patients over 48 years of age),                          is one component of                          a comprehensive surveillance  program.  Test performance has                          been validated by [facility] for patients greater                          than or equal to 37 year old.                          It is not intended                          to diagnose infection nor to                          guide or monitor treatment.  . ABO/RH(D) 12/10/2012 A POS   Final     X-Rays:Dg Chest 2 View  12/10/2012   CLINICAL DATA:  Pre operative  respiratory examination for left knee replacement surgery.  EXAM: CHEST - 2 VIEW  COMPARISON:  05/08/2010  FINDINGS: The heart size and mediastinal contours are within normal limits. There is no evidence of pulmonary edema, consolidation, pneumothorax, nodule or pleural fluid. The visualized skeletal structures show degenerative changes in both shoulders and the thoracic spine.  IMPRESSION: No active disease.   Electronically Signed   By: Irish Lack M.D.   On: 12/10/2012 10:40    EKG: Orders placed in visit on 07/09/12  . EKG 12-LEAD     Hospital Course: ERCOLE GEORG is a 62 y.o. who was admitted to [facility]. They were brought to the operating room on 12/16/2012 and underwent Procedure(s): LEFT TOTAL KNEE ARTHROPLASTY.  Patient tolerated the procedure well and was later transferred to the recovery room and then to the orthopaedic floor for postoperative care.  They were given PO and IV analgesics for pain control following their surgery.  They were given 24 hours of postoperative antibiotics of  Anti-infectives   Start     Dose/Rate Route Frequency Ordered Stop   12/16/12 1500  ceFAZolin (ANCEF) IVPB 1 g/50 mL premix     1 g 100 mL/hr over 30 Minutes Intravenous Every 6 hours 12/16/12 1221 12/16/12 2101   12/16/12 0645  ceFAZolin (ANCEF) IVPB 2 g/50 mL premix     2 g 100 mL/hr over 30 Minutes Intravenous On call to O.R. 12/16/12 1610 12/16/12 0917     and started on DVT prophylaxis in the form of Xarelto.   PT and OT were ordered for total joint protocol.  Discharge planning consulted to help with postop disposition and equipment needs.  Patient had a decent night on the evening of surgery.  They started to get up OOB with therapy on day one walking 150 feet. Hemovac drain was pulled without difficulty.  Continued to work with therapy into day two.  Dressing was changed on day two and the incision was healing well. Patient was seen in rounds and was ready to go home later that  same day after therapy.   Discharge Medications: Prior to Admission medications   Medication Sig Start Date End Date Taking? Authorizing Provider  atorvastatin (LIPITOR) 40 MG tablet Take 40 mg by mouth  every evening.   Yes Historical Provider, MD  traMADol (ULTRAM) 50 MG tablet Take 100 mg by mouth every 6 (six) hours as needed for pain.   Yes Historical Provider, MD  levothyroxine (SYNTHROID, LEVOTHROID) 50 MCG tablet Take 50 mcg by mouth every evening.    Historical Provider, MD  lisinopril-hydrochlorothiazide (PRINZIDE,ZESTORETIC) 20-12.5 MG per tablet Take 1 tablet by mouth every evening.    Historical Provider, MD  methocarbamol (ROBAXIN) 500 MG tablet Take 1 tablet (500 mg total) by mouth every 6 (six) hours as needed. 12/18/12   Alexzandrew Perkins, PA-C  oxyCODONE (OXY IR/ROXICODONE) 5 MG immediate release tablet Take 1-2 tablets (5-10 mg total) by mouth every 3 (three) hours as needed. 12/18/12   Alexzandrew Perkins, PA-C  PARoxetine (PAXIL) 20 MG tablet Take 20 mg by mouth every evening.    Historical Provider, MD  rivaroxaban (XARELTO) 10 MG TABS tablet Take 1 tablet (10 mg total) by mouth daily with breakfast. Take Xarelto for two and a half more weeks, then discontinue Xarelto. Once the patient has completed the Xarelto, they may resume the 81 mg Aspirin. 12/18/12   Alexzandrew Julien Girt, PA-C   Discharge home with home health  Diet - Cardiac diet  Follow up - in 2 weeks  Activity - WBAT  Disposition - Home  Condition Upon Discharge - Good  D/C Meds - See DC Summary  DVT Prophylaxis - Xarelto       Discharge Orders   Future Appointments Provider Department Dept Phone   04/11/2013 8:30 AM Gordy Savers, MD [facility] [place] [phone]   Future Orders Complete By Expires   Call MD / Call 911  As directed    Comments:     If you experience chest pain or shortness of breath, CALL 911 and be transported to the hospital emergency room.  If you develope a  fever above 101 F, pus (white drainage) or increased drainage or redness at the wound, or calf pain, call your surgeon's office.   Change dressing  As directed    Comments:     Change dressing daily with sterile 4 x 4 inch gauze dressing and apply TED hose. Do not submerge the incision under water.   Constipation Prevention  As directed    Comments:     Drink plenty of fluids.  Prune juice may be helpful.  You may use a stool softener, such as Colace (over the counter) 100 mg twice a day.  Use MiraLax (over the counter) for constipation as needed.   Diet general  As directed    Discharge instructions  As directed    Comments:     Pick up stool softner and laxative for home. Do not submerge incision under water. May shower. Continue to use ice for pain and swelling from surgery.  Take Xarelto for two and a half more weeks, then discontinue Xarelto. Once the patient has completed the Xarelto, they may resume the 81 mg Aspirin.   Do not put a pillow under the knee. Place it under the heel.  As directed    Do not sit on low chairs, stoools or toilet seats, as it may be difficult to get up from low surfaces  As directed    Driving restrictions  As directed    Comments:     No driving until released by the physician.   Increase activity slowly as tolerated  As directed    Lifting restrictions  As directed    Comments:  No lifting until released by the physician.   Patient may shower  As directed    Comments:     You may shower without a dressing once there is no drainage.  Do not wash over the wound.  If drainage remains, do not shower until drainage stops.   TED hose  As directed    Comments:     Use stockings (TED hose) for 3 weeks on both leg(s).  You may remove them at night for sleeping.   Weight bearing as tolerated  As directed    Questions:     Laterality:     Extremity:         Medication List    STOP taking these medications       aspirin 81 MG tablet     meloxicam  15 MG tablet  Commonly known as:  MOBIC     multivitamin tablet      TAKE these medications       atorvastatin 40 MG tablet  Commonly known as:  LIPITOR  Take 40 mg by mouth every evening.     levothyroxine 50 MCG tablet  Commonly known as:  SYNTHROID, LEVOTHROID  Take 50 mcg by mouth every evening.     lisinopril-hydrochlorothiazide 20-12.5 MG per tablet  Commonly known as:  PRINZIDE,ZESTORETIC  Take 1 tablet by mouth every evening.     methocarbamol 500 MG tablet  Commonly known as:  ROBAXIN  Take 1 tablet (500 mg total) by mouth every 6 (six) hours as needed.     oxyCODONE 5 MG immediate release tablet  Commonly known as:  Oxy IR/ROXICODONE  Take 1-2 tablets (5-10 mg total) by mouth every 3 (three) hours as needed.     PARoxetine 20 MG tablet  Commonly known as:  PAXIL  Take 20 mg by mouth every evening.     rivaroxaban 10 MG Tabs tablet  Commonly known as:  XARELTO  - Take 1 tablet (10 mg total) by mouth daily with breakfast. Take Xarelto for two and a half more weeks, then discontinue Xarelto.  - Once the patient has completed the Xarelto, they may resume the 81 mg Aspirin.     traMADol 50 MG tablet  Commonly known as:  ULTRAM  Take 100 mg by mouth every 6 (six) hours as needed for pain.       Follow-up Information   Follow up with Loanne Drilling, MD. Schedule an appointment as soon as possible for a visit on 12/31/2012.   Specialty:  Orthopedic Surgery   Contact information:   [address] [place] [address] [phone]       Signed: Patrica Duel 01/07/2013, 11:39 AM

## 2012-12-18 NOTE — Plan of Care (Signed)
Problem: Consults Goal: Diagnosis- Total Joint Replacement Outcome: Completed/Met Date Met:  12/18/12 Primary Total Knee LEFT

## 2012-12-19 NOTE — Progress Notes (Signed)
12/19/2012 Ava Tangney BSN RN CCM [phone] CM NOTIFIED 10/15 at 1655  BY [facility] rep that they would not be able to service patient. 12/19/2012 [facility] called to request if they could provide services , Rep advised that they could provide services but could not guarantee some one could start today. Will start tomorrow if no availability today.

## 2013-02-18 ENCOUNTER — Emergency Department (HOSPITAL_COMMUNITY): Payer: BC Managed Care – PPO

## 2013-02-18 ENCOUNTER — Encounter (HOSPITAL_COMMUNITY): Payer: Self-pay | Admitting: Emergency Medicine

## 2013-02-18 ENCOUNTER — Emergency Department (HOSPITAL_COMMUNITY)
Admission: EM | Admit: 2013-02-18 | Discharge: 2013-02-18 | Disposition: A | Discharge status: Home / Self Care | Payer: BC Managed Care – PPO | Attending: Emergency Medicine | Admitting: Emergency Medicine

## 2013-02-18 ENCOUNTER — Telehealth: Payer: Self-pay | Admitting: Internal Medicine

## 2013-02-18 DIAGNOSIS — Z791 Long term (current) use of non-steroidal anti-inflammatories (NSAID): Secondary | ICD-10-CM | POA: Insufficient documentation

## 2013-02-18 DIAGNOSIS — R112 Nausea with vomiting, unspecified: Secondary | ICD-10-CM | POA: Insufficient documentation

## 2013-02-18 DIAGNOSIS — Z7982 Long term (current) use of aspirin: Secondary | ICD-10-CM | POA: Insufficient documentation

## 2013-02-18 DIAGNOSIS — Z860100 Personal history of colon polyps, unspecified: Secondary | ICD-10-CM | POA: Insufficient documentation

## 2013-02-18 DIAGNOSIS — I10 Essential (primary) hypertension: Secondary | ICD-10-CM | POA: Insufficient documentation

## 2013-02-18 DIAGNOSIS — E039 Hypothyroidism, unspecified: Secondary | ICD-10-CM | POA: Insufficient documentation

## 2013-02-18 DIAGNOSIS — Z87448 Personal history of other diseases of urinary system: Secondary | ICD-10-CM | POA: Insufficient documentation

## 2013-02-18 DIAGNOSIS — R109 Unspecified abdominal pain: Secondary | ICD-10-CM

## 2013-02-18 DIAGNOSIS — M549 Dorsalgia, unspecified: Secondary | ICD-10-CM | POA: Insufficient documentation

## 2013-02-18 DIAGNOSIS — Z79899 Other long term (current) drug therapy: Secondary | ICD-10-CM | POA: Insufficient documentation

## 2013-02-18 DIAGNOSIS — Z8673 Personal history of transient ischemic attack (TIA), and cerebral infarction without residual deficits: Secondary | ICD-10-CM | POA: Insufficient documentation

## 2013-02-18 DIAGNOSIS — Z8546 Personal history of malignant neoplasm of prostate: Secondary | ICD-10-CM | POA: Insufficient documentation

## 2013-02-18 DIAGNOSIS — R1110 Vomiting, unspecified: Secondary | ICD-10-CM

## 2013-02-18 DIAGNOSIS — F3289 Other specified depressive episodes: Secondary | ICD-10-CM | POA: Insufficient documentation

## 2013-02-18 DIAGNOSIS — M1990 Unspecified osteoarthritis, unspecified site: Secondary | ICD-10-CM | POA: Insufficient documentation

## 2013-02-18 DIAGNOSIS — R1033 Periumbilical pain: Secondary | ICD-10-CM | POA: Insufficient documentation

## 2013-02-18 DIAGNOSIS — F329 Major depressive disorder, single episode, unspecified: Secondary | ICD-10-CM | POA: Insufficient documentation

## 2013-02-18 LAB — CBC WITH DIFFERENTIAL/PLATELET
Basophils Absolute: 0.1 10*3/uL (ref 0.0–0.1)
Basophils Relative: 1 % (ref 0–1)
Eosinophils Absolute: 0.1 10*3/uL (ref 0.0–0.7)
Eosinophils Relative: 2 % (ref 0–5)
HCT: 43.9 % (ref 39.0–52.0)
Lymphocytes Relative: 20 % (ref 12–46)
MCH: 29.8 pg (ref 26.0–34.0)
MCHC: 33.9 g/dL (ref 30.0–36.0)
MCV: 87.8 fL (ref 78.0–100.0)
Monocytes Absolute: 1.2 10*3/uL — ABNORMAL HIGH (ref 0.1–1.0)
Neutrophils Relative %: 64 % (ref 43–77)
Platelets: 194 10*3/uL (ref 150–400)
RDW: 14.3 % (ref 11.5–15.5)

## 2013-02-18 LAB — COMPREHENSIVE METABOLIC PANEL
ALT: 20 U/L (ref 0–53)
AST: 31 U/L (ref 0–37)
CO2: 24 mEq/L (ref 19–32)
Calcium: 9.9 mg/dL (ref 8.4–10.5)
Creatinine, Ser: 1 mg/dL (ref 0.50–1.35)
GFR calc non Af Amer: 78 mL/min — ABNORMAL LOW (ref >90)
Potassium: 3.3 mEq/L — ABNORMAL LOW (ref 3.5–5.1)
Total Protein: 7.2 g/dL (ref 6.0–8.3)

## 2013-02-18 LAB — CG4 I-STAT (LACTIC ACID): Lactic Acid, Venous: 1.89 mmol/L (ref 0.5–2.2)

## 2013-02-18 MED ORDER — IOHEXOL 300 MG/ML  SOLN
100.0000 mL | Freq: Once | INTRAMUSCULAR | Status: AC | PRN
  Administered 2013-02-18: 100 mL via INTRAVENOUS

## 2013-02-18 MED ORDER — ONDANSETRON HCL 4 MG PO TABS: 4.0000 mg | ORAL_TABLET | Freq: Three times a day (TID) | ORAL | Status: DC | PRN

## 2013-02-18 MED ORDER — ONDANSETRON HCL 4 MG PO TABS
4.0000 mg | ORAL_TABLET | Freq: Once | ORAL | Status: AC
  Administered 2013-02-18: 4 mg via ORAL
  Filled 2013-02-18: qty 1

## 2013-02-18 MED ORDER — SODIUM CHLORIDE 0.9 % IV BOLUS (SEPSIS)
1000.0000 mL | Freq: Once | INTRAVENOUS | Status: AC
  Administered 2013-02-18: 1000 mL via INTRAVENOUS

## 2013-02-18 MED ORDER — MORPHINE SULFATE 4 MG/ML IJ SOLN
4.0000 mg | Freq: Once | INTRAMUSCULAR | Status: AC
  Administered 2013-02-18: 4 mg via INTRAVENOUS
  Filled 2013-02-18: qty 1

## 2013-02-18 MED ORDER — ONDANSETRON HCL 4 MG/2ML IJ SOLN
4.0000 mg | Freq: Once | INTRAMUSCULAR | Status: AC
  Administered 2013-02-18: 4 mg via INTRAVENOUS
  Filled 2013-02-18: qty 2

## 2013-02-18 MED ORDER — OXYCODONE-ACETAMINOPHEN 5-325 MG PO TABS
1.0000 | ORAL_TABLET | Freq: Once | ORAL | Status: AC
  Administered 2013-02-18: 1 via ORAL
  Filled 2013-02-18: qty 1

## 2013-02-18 MED ORDER — IOHEXOL 300 MG/ML  SOLN
25.0000 mL | Freq: Once | INTRAMUSCULAR | Status: AC | PRN
  Administered 2013-02-18: 25 mL via ORAL

## 2013-02-18 NOTE — ED Notes (Signed)
Per EMS: n/v since Saturday along with chills. Pt now c/o cough, knee and lower back pain. 4/10 pain.

## 2013-02-18 NOTE — Telephone Encounter (Signed)
Noted  

## 2013-02-18 NOTE — ED Provider Notes (Signed)
CSN: [id number]     Arrival date & time 02/18/13  1635 History   First MD Initiated Contact with Patient 02/18/13 1641     Chief Complaint  Patient presents with  . Nausea  . Vomiting   (Consider location/radiation/quality/duration/timing/severity/associated sxs/prior Treatment) HPI  This is a 62 year old male who presents with vomiting. He describes nonbilious, nonbloody emesis for the last 4 days. Patient reports abdominal pain that is periumbilical and nonradiating. He reports his pain as 9/10. She denies any diarrhea. Last normal bowel movement was last night. He denies any fevers. Patient reports increasing and worsening back pain with vomiting.  Patient has metal rods in his back. He denies any weakness, numbness, tingling or urinary difficulty. Patient has had prior abdominal surgeries for prostate cancer.  Past Medical History  Diagnosis Date  . Allergy   . Depression   . Prostate cancer   . Osteoarthritis   . Insomnia 12-10-12    not a problem now  . Erectile dysfunction   . Hypertension   . Hx of colonic polyps   . Hypothyroidism   . Stroke, lacunar     "mini- episodes of severe hand shaking"   Past Surgical History  Procedure Laterality Date  . Prostatectomy    . Tonsillectomy    . Knee surgery Left     open surgery  . Elbow surgery    . Tibia fracture surgery    . Hernia repair Left     LIH  . Back surgery  12-10-12    '12-Lumbar fusion-retained hardware  . Total knee arthroplasty Left 12/16/2012    Procedure: LEFT TOTAL KNEE ARTHROPLASTY;  Surgeon: Loanne Drilling, MD;  Location: [facility];  Service: Orthopedics;  Laterality: Left;   Family History  Problem Relation Age of Onset  . Asthma Mother   . Heart attack Father   . Heart attack Sister   . Coronary artery disease      fhx   History  Substance Use Topics  . Smoking status: Never Smoker   . Smokeless tobacco: Current User    Types: Chew  . Alcohol Use: 1.0 oz/week    2 drink(s) per week      Comment: weekends    Review of Systems  Constitutional: Negative.  Negative for fever.  Respiratory: Negative.  Negative for chest tightness and shortness of breath.   Cardiovascular: Negative.  Negative for chest pain.  Gastrointestinal: Positive for nausea, vomiting and abdominal pain. Negative for diarrhea and constipation.  Genitourinary: Negative.  Negative for dysuria.  Musculoskeletal: Positive for back pain.  Skin: Negative for rash.  Neurological: Negative for weakness, numbness and headaches.  All other systems reviewed and are negative.    Allergies  Review of patient's allergies indicates no known allergies.  Home Medications   Current Outpatient Rx  Name  Route  Sig  Dispense  Refill  . aspirin EC 81 MG tablet   Oral   Take 81 mg by mouth every evening.         Marland Kitchen atorvastatin (LIPITOR) 40 MG tablet   Oral   Take 40 mg by mouth every evening.         Marland Kitchen levothyroxine (SYNTHROID, LEVOTHROID) 50 MCG tablet   Oral   Take 50 mcg by mouth every evening.         Marland Kitchen lisinopril-hydrochlorothiazide (PRINZIDE,ZESTORETIC) 20-12.5 MG per tablet   Oral   Take 1 tablet by mouth every evening.         Marland Kitchen  meloxicam (MOBIC) 15 MG tablet   Oral   Take 15 mg by mouth every evening.         Marland Kitchen PARoxetine (PAXIL) 20 MG tablet   Oral   Take 20 mg by mouth every evening.         . traMADol (ULTRAM) 50 MG tablet   Oral   Take 100 mg by mouth every 6 (six) hours as needed for pain.         Marland Kitchen ondansetron (ZOFRAN) 4 MG tablet   Oral   Take 1 tablet (4 mg total) by mouth every 8 (eight) hours as needed for nausea or vomiting.   20 tablet   0    BP 144/75  Pulse 70  Temp(Src) 98.3 F (36.8 C) (Oral)  Resp 12  SpO2 98% Physical Exam  Nursing note and vitals reviewed. Constitutional: He is oriented to person, place, and time. No distress.  Uncomfortable appearing, no acute distress  HENT:  Head: Normocephalic and atraumatic.  Eyes: Pupils are equal, round,  and reactive to light.  Neck: Neck supple.  Cardiovascular: Normal rate, regular rhythm and normal heart sounds.   No murmur heard. Pulmonary/Chest: Effort normal and breath sounds normal. No respiratory distress. He has no wheezes.  Abdominal: Soft. Bowel sounds are normal. There is tenderness. There is no rebound.  Vertical midline abdominal scar inferior to the umbilicus, hyperactive bowel sounds  Musculoskeletal: He exhibits no edema.  Lymphadenopathy:    He has no cervical adenopathy.  Neurological: He is alert and oriented to person, place, and time.  Skin: Skin is warm and dry.  Psychiatric: He has a normal mood and affect.    ED Course  Procedures (including critical care time) Labs Review Labs Reviewed  CBC WITH DIFFERENTIAL - Abnormal; Notable for the following:    Monocytes Relative 14 (*)    Monocytes Absolute 1.2 (*)    All other components within normal limits  COMPREHENSIVE METABOLIC PANEL - Abnormal; Notable for the following:    Potassium 3.3 (*)    Glucose, Bld 113 (*)    GFR calc non Af Amer 78 (*)    GFR calc Af Amer 90 (*)    All other components within normal limits  LIPASE, BLOOD  CG4 I-STAT (LACTIC ACID)   Imaging Review Ct Abdomen Pelvis W Contrast  02/18/2013   CLINICAL DATA:  Nausea and vomiting. Back pain. Chills. Cough. Prostate carcinoma.  EXAM: CT ABDOMEN AND PELVIS WITH CONTRAST  TECHNIQUE: Multidetector CT imaging of the abdomen and pelvis was performed using the standard protocol following bolus administration of intravenous contrast.  CONTRAST:  OMNIPAQUE IOHEXOL 300 MG/ML  SOLN  COMPARISON:  05/30/2010  FINDINGS: The liver, gallbladder, spleen, pancreas, adrenal glands, and kidneys are unremarkable in appearance. No evidence of hydronephrosis. No soft tissue masses identified within the abdomen or pelvis.  Surgical clips seen from previous prostatectomy and iliac lymph node dissection. No pelvic or abdominal lymphadenopathy identified no  evidence of inflammatory process or abnormal fluid collections. Diverticulosis is again seen involving the sigmoid colon, however there is no evidence of diverticulitis. No evidence of dilated bowel loops or hernia. Lumbar spine fusion hardware noted, however, no suspicious bone lesions identified.  IMPRESSION: No acute findings.  No evidence of metastatic disease.  Diverticulosis. No radiographic evidence of diverticulitis.   Electronically Signed   By: Myles Rosenthal M.D.   On: 02/18/2013 19:44   Dg Abd Acute W/chest  02/18/2013   CLINICAL DATA:  Abdominal pain, nausea, and vomiting.  EXAM: ACUTE ABDOMEN SERIES (ABDOMEN 2 VIEW & CHEST 1 VIEW)  COMPARISON:  12/10/2012 chest radiographs. 05/30/2010 three-way abdominal series.  FINDINGS: The cardiomediastinal silhouette is within normal limits. The lungs are well inflated and clear. There is no evidence of pleural effusion or pneumothorax. Acromioclavicular osteoarthrosis is noted bilaterally.  Gas is present several loops of mildly prominent small bowel in the right mid abdomen, which measure up to approximately 3.3 cm in diameter. There is mild gaseous distension of the colon, particularly the hepatic and splenic flexures. Gas is seen in the colon to the level of the rectum, without colonic dilatation. No air-fluid levels are seen. No intraperitoneal free air is identified. Surgical clips are noted in the pelvis. Prior L3-L5 posterior lumbar interbody fusion is identified.  IMPRESSION: 1. No evidence of acute airspace disease. 2. Mild prominence of small bowel loops in the right hemiabdomen, which could represent mild ileus. No evidence of frank bowel obstruction or intraperitoneal free gas.   Electronically Signed   By: Sebastian Ache   On: 02/18/2013 17:21    EKG Interpretation    Date/Time:  Tuesday February 18 2013 21:23:48 EST Ventricular Rate:  79 PR Interval:  131 QRS Duration: 98 QT Interval:  412 QTC Calculation: 472 R Axis:   -18 Text  Interpretation:  Sinus rhythm Borderline left axis deviation Confirmed by Adaiah Jaskot  MD, Maelani Yarbro ([id number]) on 02/18/2013 9:29:04 PM            MDM   1. Vomiting   2. Abdominal pain    Patient presents with abdominal pain and vomiting. He denies any diarrhea. He is ill-appearing but nontoxic on exam. Initial lab work is reassuring including a normal lactate. Patient was given fluids and Zofran. This controlled his emesis while in the emergency department. KUB suspicious for possible ileus. CT scan obtained and is negative.  Reexamination of the patient is reassuring.  Patient was able to tolerate by mouth. Suspect vomiting may be viral in nature. Screening EKG reassuring. Patient was given strict return precautions.  After history, exam, and medical workup I feel the patient has been appropriately medically screened and is safe for discharge home. Pertinent diagnoses were discussed with the patient. Patient was given return precautions.    Shon Baton, MD 02/18/13 2150

## 2013-02-18 NOTE — ED Notes (Signed)
EKG given to EDP, Horton,MD. For review. 

## 2013-02-18 NOTE — ED Notes (Signed)
Bed: ZO10 Expected date:  Expected time:  Means of arrival:  Comments: EMS-N/V-[place]

## 2013-02-18 NOTE — Telephone Encounter (Signed)
Patient Information:  Caller Name: Victoria  Phone: [phone]  Patient: [name], [name]  Gender: Male  DOB: 04/20/1950  Age: 62 Years  PCP: Darryll Capers (Adults only)  Office Follow Up:  Does the office need to follow up with this patient?: No  Instructions For The Office: N/A  RN Note:  Patient calling emergent line regarding vomiting x 3 days.  Onset of vomiting 02/15/13.  States no diarrhea.  Afebrile.  c/o back pain and cough as well.  Has been vomiting many times per day.  Per vomiting protocol, disposition ED due to vomiting > 3 times daily and age > 34, AND no appts available in Epic for office.  TC to office due to disposition ED; per Doy Hutching, patient to go to [facility] ED.  Patient agrees.  krs/can  Symptoms  Reason For Call & Symptoms: vomiting  Reviewed Health History In EMR: Yes  Reviewed Medications In EMR: Yes  Reviewed Allergies In EMR: Yes  Reviewed Surgeries / Procedures: Yes  Date of Onset of Symptoms: 02/15/2013  Guideline(s) Used:  Vomiting  Disposition Per Guideline:   Go to ED Now  Reason For Disposition Reached:   Moderate vomiting (e.g., 3 - 5 times/day) and age > 90  Advice Given:  N/A  Patient Will Follow Care Advice:  YES

## 2013-02-18 NOTE — ED Notes (Signed)
Aneta Mins Lykens(son) [phone]

## 2013-04-11 ENCOUNTER — Ambulatory Visit (INDEPENDENT_AMBULATORY_CARE_PROVIDER_SITE_OTHER): Payer: BC Managed Care – PPO | Admitting: Internal Medicine

## 2013-04-11 ENCOUNTER — Encounter: Payer: Self-pay | Admitting: Internal Medicine

## 2013-04-11 VITALS — BP 122/80 | HR 69 | Temp 98.7°F | Resp 20 | Ht 69.0 in | Wt 241.0 lb

## 2013-04-11 DIAGNOSIS — Z23 Encounter for immunization: Secondary | ICD-10-CM

## 2013-04-11 DIAGNOSIS — M1990 Unspecified osteoarthritis, unspecified site: Secondary | ICD-10-CM

## 2013-04-11 DIAGNOSIS — I10 Essential (primary) hypertension: Secondary | ICD-10-CM

## 2013-04-11 NOTE — Progress Notes (Signed)
Subjective:    Patient ID: [name], male    DOB: 06/29/1950, 63 y.o.   MRN: [id number]  HPI   63 year old patient who is in today for followup. He has hypertension and osteoarthritis. He is status post left total knee replacement surgery in October and has done quite well with that surgery. He is back to work and much more active. No new concerns or complaints today. He takes tramadol rarely but still is on a daily dose of meloxicam.  Past Medical History  Diagnosis Date  . Allergy   . Depression   . Prostate cancer   . Osteoarthritis   . Insomnia 12-10-12    not a problem now  . Erectile dysfunction   . Hypertension   . Hx of colonic polyps   . Hypothyroidism   . Stroke, lacunar     "mini- episodes of severe hand shaking"    History   Social History  . Marital Status: Single    Spouse Name: N/A    Number of Children: N/A  . Years of Education: N/A   Occupational History  . Not on file.   Social History Main Topics  . Smoking status: Never Smoker   . Smokeless tobacco: Current User    Types: Chew  . Alcohol Use: 1.0 oz/week    2 drink(s) per week     Comment: weekends  . Drug Use: No  . Sexual Activity: Yes   Other Topics Concern  . Not on file   Social History Narrative  . No narrative on file    Past Surgical History  Procedure Laterality Date  . Prostatectomy    . Tonsillectomy    . Knee surgery Left     open surgery  . Elbow surgery    . Tibia fracture surgery    . Hernia repair Left     LIH  . Back surgery  12-10-12    '12-Lumbar fusion-retained hardware  . Total knee arthroplasty Left 12/16/2012    Procedure: LEFT TOTAL KNEE ARTHROPLASTY;  Surgeon: Gearlean Alf, MD;  Location: [facility];  Service: Orthopedics;  Laterality: Left;    Family History  Problem Relation Age of Onset  . Asthma Mother   . Heart attack Father   . Heart attack Sister   . Coronary artery disease      fhx    No Known Allergies  Current Outpatient  Prescriptions on File Prior to Visit  Medication Sig Dispense Refill  . aspirin EC 81 MG tablet Take 81 mg by mouth every evening.      Marland Kitchen atorvastatin (LIPITOR) 40 MG tablet Take 40 mg by mouth every evening.      Marland Kitchen levothyroxine (SYNTHROID, LEVOTHROID) 50 MCG tablet Take 50 mcg by mouth every evening.      Marland Kitchen lisinopril-hydrochlorothiazide (PRINZIDE,ZESTORETIC) 20-12.5 MG per tablet Take 1 tablet by mouth every evening.      . meloxicam (MOBIC) 15 MG tablet Take 15 mg by mouth every evening.      . ondansetron (ZOFRAN) 4 MG tablet Take 1 tablet (4 mg total) by mouth every 8 (eight) hours as needed for nausea or vomiting.  20 tablet  0  . PARoxetine (PAXIL) 20 MG tablet Take 20 mg by mouth every evening.      . traMADol (ULTRAM) 50 MG tablet Take 100 mg by mouth every 6 (six) hours as needed for pain.       No current facility-administered medications on file prior  to visit.    BP 122/80  Pulse 69  Temp(Src) 98.7 F (37.1 C) (Oral)  Resp 20  Ht 5\' 9"  (1.753 m)  Wt 241 lb (109.317 kg)  BMI 35.57 kg/m2  SpO2 98%       Review of Systems  Constitutional: Negative for fever, chills, appetite change and fatigue.  HENT: Negative for congestion, dental problem, ear pain, hearing loss, sore throat, tinnitus, trouble swallowing and voice change.   Eyes: Negative for pain, discharge and visual disturbance.  Respiratory: Negative for cough, chest tightness, wheezing and stridor.   Cardiovascular: Negative for chest pain, palpitations and leg swelling.  Gastrointestinal: Negative for nausea, vomiting, abdominal pain, diarrhea, constipation, blood in stool and abdominal distention.  Genitourinary: Negative for urgency, hematuria, flank pain, discharge, difficulty urinating and genital sores.  Musculoskeletal: Positive for arthralgias. Negative for back pain, gait problem, joint swelling, myalgias and neck stiffness.  Skin: Negative for rash.  Neurological: Negative for dizziness, syncope,  speech difficulty, weakness, numbness and headaches.  Hematological: Negative for adenopathy. Does not bruise/bleed easily.  Psychiatric/Behavioral: Negative for behavioral problems and dysphoric mood. The patient is not nervous/anxious.        Objective:   Physical Exam  Constitutional: He is oriented to person, place, and time. He appears well-developed.  HENT:  Head: Normocephalic.  Right Ear: External ear normal.  Left Ear: External ear normal.  Eyes: Conjunctivae and EOM are normal.  Neck: Normal range of motion.  Cardiovascular: Normal rate and normal heart sounds.   Pulmonary/Chest: Breath sounds normal.  Abdominal: Bowel sounds are normal.  Musculoskeletal: Normal range of motion. He exhibits no edema and no tenderness.  Neurological: He is alert and oriented to person, place, and time.  Skin:  Well-healed surgical scar left knee  Psychiatric: He has a normal mood and affect. His behavior is normal.          Assessment & Plan:   Osteoarthritis. Status post left total knee replacement surgery Hypertension excellent control  We'll attempt to moderate use of meloxicam CPX 6 months

## 2013-04-11 NOTE — Patient Instructions (Signed)
Limit your sodium (Salt) intake  Please check your blood pressure on a regular basis.  If it is consistently greater than 150/90, please make an office appointment.    It is important that you exercise regularly, at least 20 minutes 3 to 4 times per week.  If you develop chest pain or shortness of breath seek  medical attention.  Return in 6 months for follow-up  

## 2013-04-11 NOTE — Progress Notes (Signed)
Pre-visit discussion using our clinic review tool. No additional management support is needed unless otherwise documented below in the visit note.  

## 2013-04-14 ENCOUNTER — Telehealth: Payer: Self-pay | Admitting: Internal Medicine

## 2013-04-14 NOTE — Telephone Encounter (Signed)
Relevant patient education assigned to patient using Emmi. ° °

## 2013-05-29 ENCOUNTER — Telehealth: Payer: Self-pay | Admitting: Internal Medicine

## 2013-05-29 NOTE — Telephone Encounter (Signed)
[facility] states the [facility] needs a current list of his medications faxed to [phone] attn: meagan (nurse)

## 2013-05-30 NOTE — Telephone Encounter (Signed)
Medication List faxed to [place]. Tried to contact pt to let him know no answer.

## 2013-05-30 NOTE — Telephone Encounter (Signed)
Left detailed message medication list faxed to [facility]

## 2013-06-27 ENCOUNTER — Encounter: Payer: Self-pay | Admitting: Gastroenterology

## 2013-07-16 ENCOUNTER — Ambulatory Visit (INDEPENDENT_AMBULATORY_CARE_PROVIDER_SITE_OTHER): Payer: BC Managed Care – PPO | Admitting: Internal Medicine

## 2013-07-16 ENCOUNTER — Encounter: Payer: Self-pay | Admitting: Internal Medicine

## 2013-07-16 VITALS — BP 128/90 | HR 83 | Temp 98.4°F | Resp 20 | Ht 69.0 in | Wt 244.0 lb

## 2013-07-16 DIAGNOSIS — Z87442 Personal history of urinary calculi: Secondary | ICD-10-CM

## 2013-07-16 DIAGNOSIS — R109 Unspecified abdominal pain: Secondary | ICD-10-CM

## 2013-07-16 DIAGNOSIS — M5430 Sciatica, unspecified side: Secondary | ICD-10-CM

## 2013-07-16 LAB — POCT URINALYSIS DIPSTICK
Bilirubin, UA: NEGATIVE
GLUCOSE UA: NEGATIVE
KETONES UA: NEGATIVE
Leukocytes, UA: NEGATIVE
Nitrite, UA: NEGATIVE
Protein, UA: NEGATIVE
SPEC GRAV UA: 1.02
UROBILINOGEN UA: 0.2
pH, UA: 6

## 2013-07-16 MED ORDER — TRAMADOL HCL 50 MG PO TABS: 100.0000 mg | ORAL_TABLET | Freq: Four times a day (QID) | ORAL | Status: DC | PRN

## 2013-07-16 NOTE — Progress Notes (Signed)
Pre-visit discussion using our clinic review tool. No additional management support is needed unless otherwise documented below in the visit note.  

## 2013-07-16 NOTE — Patient Instructions (Signed)
Drink as much fluid as you  can tolerate over the next few days  Call or return to clinic prn if these symptoms worsen or fail to improve as anticipated.  

## 2013-07-16 NOTE — Progress Notes (Signed)
Subjective:    Patient ID: [name], male    DOB: Aug 27, 1950, 63 y.o.   MRN: [id number]  HPI 63 year old patient who has a history of nephrolithiasis.  He states that he required lithotripsy in the late eighties.  For the past 5 days.  She has had intermittent right flank pain.  He is concerned about a recurrent kidney stone.  He also has a history of low back pain and sciatica.  He did have an abdominal CT with contrast in December of last year.  It revealed no evidence of kidney stones.  He has been pain-free now for almost 24 hours.  Denies any hematuria or dysuria.  Does have a prior history of prostate cancer.  Past Medical History  Diagnosis Date  . Allergy   . Depression   . Prostate cancer   . Osteoarthritis   . Insomnia 12-10-12    not a problem now  . Erectile dysfunction   . Hypertension   . Hx of colonic polyps   . Hypothyroidism   . Stroke, lacunar     "mini- episodes of severe hand shaking"    History   Social History  . Marital Status: Single    Spouse Name: N/A    Number of Children: N/A  . Years of Education: N/A   Occupational History  . Not on file.   Social History Main Topics  . Smoking status: Never Smoker   . Smokeless tobacco: Current User    Types: Chew  . Alcohol Use: 1.0 oz/week    2 drink(s) per week     Comment: weekends  . Drug Use: No  . Sexual Activity: Yes   Other Topics Concern  . Not on file   Social History Narrative  . No narrative on file    Past Surgical History  Procedure Laterality Date  . Prostatectomy    . Tonsillectomy    . Knee surgery Left     open surgery  . Elbow surgery    . Tibia fracture surgery    . Hernia repair Left     LIH  . Back surgery  12-10-12    '12-Lumbar fusion-retained hardware  . Total knee arthroplasty Left 12/16/2012    Procedure: LEFT TOTAL KNEE ARTHROPLASTY;  Surgeon: Gearlean Alf, MD;  Location: [facility];  Service: Orthopedics;  Laterality: Left;    Family History    Problem Relation Age of Onset  . Asthma Mother   . Heart attack Father   . Heart attack Sister   . Coronary artery disease      fhx    No Known Allergies  Current Outpatient Prescriptions on File Prior to Visit  Medication Sig Dispense Refill  . aspirin EC 81 MG tablet Take 81 mg by mouth every evening.      Marland Kitchen atorvastatin (LIPITOR) 40 MG tablet Take 40 mg by mouth every evening.      Marland Kitchen levothyroxine (SYNTHROID, LEVOTHROID) 50 MCG tablet Take 50 mcg by mouth every evening.      Marland Kitchen lisinopril-hydrochlorothiazide (PRINZIDE,ZESTORETIC) 20-12.5 MG per tablet Take 1 tablet by mouth every evening.      Marland Kitchen PARoxetine (PAXIL) 20 MG tablet Take 20 mg by mouth every evening.      . traMADol (ULTRAM) 50 MG tablet Take 100 mg by mouth every 6 (six) hours as needed for pain.       No current facility-administered medications on file prior to visit.    BP 128/90  Pulse 83  Temp(Src) 98.4 F (36.9 C) (Oral)  Resp 20  Ht 5\' 9"  (1.753 m)  Wt 244 lb (110.678 kg)  BMI 36.02 kg/m2  SpO2 97%      Review of Systems  Constitutional: Negative for fever, chills, appetite change and fatigue.  HENT: Negative for congestion, dental problem, ear pain, hearing loss, sore throat, tinnitus, trouble swallowing and voice change.   Eyes: Negative for pain, discharge and visual disturbance.  Respiratory: Negative for cough, chest tightness, wheezing and stridor.   Cardiovascular: Negative for chest pain, palpitations and leg swelling.  Gastrointestinal: Negative for nausea, vomiting, abdominal pain, diarrhea, constipation, blood in stool and abdominal distention.  Genitourinary: Positive for flank pain. Negative for urgency, hematuria, discharge, difficulty urinating and genital sores.  Musculoskeletal: Negative for arthralgias, back pain, gait problem, joint swelling, myalgias and neck stiffness.  Skin: Negative for rash.  Neurological: Negative for dizziness, syncope, speech difficulty, weakness,  numbness and headaches.  Hematological: Negative for adenopathy. Does not bruise/bleed easily.  Psychiatric/Behavioral: Negative for behavioral problems and dysphoric mood. The patient is not nervous/anxious.        Objective:   Physical Exam  Constitutional: He is oriented to person, place, and time. He appears well-developed.  HENT:  Head: Normocephalic.  Right Ear: External ear normal.  Left Ear: External ear normal.  Eyes: Conjunctivae and EOM are normal.  Neck: Normal range of motion.  Cardiovascular: Normal rate and normal heart sounds.   Pulmonary/Chest: Breath sounds normal.  Abdominal: Bowel sounds are normal.  Mild tenderness right flank area to palpation  Musculoskeletal: Normal range of motion. He exhibits no edema and no tenderness.  Neurological: He is alert and oriented to person, place, and time.  Psychiatric: He has a normal mood and affect. His behavior is normal.          Assessment & Plan:   Right flank pain History of nephrolithiasis- we'll check a urinalysis Negative abdominal CT scan December 2014  Patient has been pain-free from [place] 24 hours.  We'll force fluids and observe.  Will call if he has any recurrent pain.  Urinalysis reveals moderate hematuria.  Patient probably has a small stone that hopefully he will pass

## 2013-07-30 ENCOUNTER — Other Ambulatory Visit: Payer: Self-pay | Admitting: Internal Medicine

## 2013-10-09 ENCOUNTER — Other Ambulatory Visit: Payer: BC Managed Care – PPO

## 2013-10-09 ENCOUNTER — Ambulatory Visit: Payer: BC Managed Care – PPO | Admitting: Internal Medicine

## 2013-10-15 ENCOUNTER — Other Ambulatory Visit (INDEPENDENT_AMBULATORY_CARE_PROVIDER_SITE_OTHER): Payer: BC Managed Care – PPO

## 2013-10-15 DIAGNOSIS — Z0000 Encounter for general adult medical examination without abnormal findings: Secondary | ICD-10-CM

## 2013-10-15 LAB — CBC WITH DIFFERENTIAL/PLATELET
BASOS ABS: 0 10*3/uL (ref 0.0–0.1)
Basophils Relative: 0.1 % (ref 0.0–3.0)
EOS ABS: 0.1 10*3/uL (ref 0.0–0.7)
Eosinophils Relative: 1.4 % (ref 0.0–5.0)
HEMATOCRIT: 44.4 % (ref 39.0–52.0)
HEMOGLOBIN: 14.9 g/dL (ref 13.0–17.0)
LYMPHS ABS: 1.4 10*3/uL (ref 0.7–4.0)
Lymphocytes Relative: 18.4 % (ref 12.0–46.0)
MCHC: 33.7 g/dL (ref 30.0–36.0)
MCV: 93 fl (ref 78.0–100.0)
MONO ABS: 1.1 10*3/uL — AB (ref 0.1–1.0)
Monocytes Relative: 14.6 % — ABNORMAL HIGH (ref 3.0–12.0)
NEUTROS ABS: 4.9 10*3/uL (ref 1.4–7.7)
Neutrophils Relative %: 65.5 % (ref 43.0–77.0)
PLATELETS: 249 10*3/uL (ref 150.0–400.0)
RBC: 4.77 Mil/uL (ref 4.22–5.81)
RDW: 15 % (ref 11.5–15.5)
WBC: 7.5 10*3/uL (ref 4.0–10.5)

## 2013-10-15 LAB — BASIC METABOLIC PANEL
BUN: 16 mg/dL (ref 6–23)
CO2: 26 meq/L (ref 19–32)
Calcium: 10 mg/dL (ref 8.4–10.5)
Chloride: 102 mEq/L (ref 96–112)
Creatinine, Ser: 1 mg/dL (ref 0.4–1.5)
GFR: 78.86 mL/min (ref >60.00)
GLUCOSE: 114 mg/dL — AB (ref 70–99)
POTASSIUM: 3.8 meq/L (ref 3.5–5.1)
SODIUM: 136 meq/L (ref 135–145)

## 2013-10-15 LAB — HEPATIC FUNCTION PANEL
ALT: 26 U/L (ref 0–53)
AST: 31 U/L (ref 0–37)
Albumin: 4.4 g/dL (ref 3.5–5.2)
Alkaline Phosphatase: 88 U/L (ref 39–117)
BILIRUBIN TOTAL: 1 mg/dL (ref 0.2–1.2)
Bilirubin, Direct: 0.2 mg/dL (ref 0.0–0.3)
Total Protein: 7 g/dL (ref 6.0–8.3)

## 2013-10-15 LAB — LIPID PANEL
Cholesterol: 142 mg/dL (ref 0–200)
HDL: 31.2 mg/dL — ABNORMAL LOW (ref >39.00)
LDL Cholesterol: 82 mg/dL (ref 0–99)
NONHDL: 110.8
Total CHOL/HDL Ratio: 5
Triglycerides: 144 mg/dL (ref 0.0–149.0)
VLDL: 28.8 mg/dL (ref 0.0–40.0)

## 2013-10-15 LAB — POCT URINALYSIS DIPSTICK
BILIRUBIN UA: NEGATIVE
Glucose, UA: NEGATIVE
KETONES UA: NEGATIVE
LEUKOCYTES UA: NEGATIVE
NITRITE UA: NEGATIVE
PH UA: 5.5
PROTEIN UA: NEGATIVE
Spec Grav, UA: 1.02
Urobilinogen, UA: 0.2

## 2013-10-15 LAB — TSH: TSH: 5.88 u[IU]/mL — AB (ref 0.35–4.50)

## 2013-10-15 LAB — PSA: PSA: 0 ng/mL — AB (ref 0.10–4.00)

## 2013-10-16 ENCOUNTER — Encounter: Payer: BC Managed Care – PPO | Admitting: Internal Medicine

## 2013-10-27 ENCOUNTER — Ambulatory Visit (INDEPENDENT_AMBULATORY_CARE_PROVIDER_SITE_OTHER): Payer: BC Managed Care – PPO | Admitting: Internal Medicine

## 2013-10-27 ENCOUNTER — Encounter: Payer: Self-pay | Admitting: *Deleted

## 2013-10-27 ENCOUNTER — Telehealth: Payer: Self-pay | Admitting: Internal Medicine

## 2013-10-27 ENCOUNTER — Encounter: Payer: Self-pay | Admitting: Internal Medicine

## 2013-10-27 VITALS — BP 118/80 | HR 79 | Temp 99.1°F | Resp 20 | Ht 70.0 in | Wt 243.0 lb

## 2013-10-27 DIAGNOSIS — Z8546 Personal history of malignant neoplasm of prostate: Secondary | ICD-10-CM

## 2013-10-27 DIAGNOSIS — M1990 Unspecified osteoarthritis, unspecified site: Secondary | ICD-10-CM

## 2013-10-27 DIAGNOSIS — E039 Hypothyroidism, unspecified: Secondary | ICD-10-CM

## 2013-10-27 DIAGNOSIS — Z86010 Personal history of colonic polyps: Secondary | ICD-10-CM

## 2013-10-27 DIAGNOSIS — I10 Essential (primary) hypertension: Secondary | ICD-10-CM

## 2013-10-27 DIAGNOSIS — R7309 Other abnormal glucose: Secondary | ICD-10-CM

## 2013-10-27 DIAGNOSIS — R7302 Impaired glucose tolerance (oral): Secondary | ICD-10-CM | POA: Insufficient documentation

## 2013-10-27 MED ORDER — TRAMADOL HCL 50 MG PO TABS: 100.0000 mg | ORAL_TABLET | Freq: Four times a day (QID) | ORAL | Status: DC | PRN

## 2013-10-27 MED ORDER — ATORVASTATIN CALCIUM 40 MG PO TABS: ORAL_TABLET | ORAL | Status: DC

## 2013-10-27 MED ORDER — PAROXETINE HCL 20 MG PO TABS: 20.0000 mg | ORAL_TABLET | Freq: Every evening | ORAL | Status: DC

## 2013-10-27 MED ORDER — LEVOTHYROXINE SODIUM 50 MCG PO TABS: ORAL_TABLET | ORAL | Status: DC

## 2013-10-27 MED ORDER — LISINOPRIL-HYDROCHLOROTHIAZIDE 20-12.5 MG PO TABS: ORAL_TABLET | ORAL | Status: DC

## 2013-10-27 MED ORDER — LEVOTHYROXINE SODIUM 75 MCG PO TABS: ORAL_TABLET | ORAL | Status: DC

## 2013-10-27 NOTE — Patient Instructions (Signed)
Limit your sodium (Salt) intake  Please check your blood pressure on a regular basis.  If it is consistently greater than 150/90, please make an office appointment.  Schedule your colonoscopy to help detect colon cancer.  Return in 6 months for follow-up  

## 2013-10-27 NOTE — Progress Notes (Signed)
Subjective:    Patient ID: [name], male    DOB: [birth year], 63 y.o.   MRN: [id number]  HPI  63 year old patient who is seen today for a preventive health examination Medical problems include the hypertension.  This has been well-controlled on combination therapy. He has hypothyroidism.  Recent TSH was slightly elevated.  Levo thyroxine and adjusted today He has a history of prostate cancer.  PSA 0 He remains on atorvastatin for dyslipidemia Laboratory studies revealed mild impaired glucose tolerance.  Implications of this discussed He has a history of colonic polyps.  Last colonoscopy was June 2015.  Presumably, these were adenomatous polyps.  Pathology could not be obtained.  We'll set up for followup colonoscopy  Past Medical History  Diagnosis Date  . Allergy   . Depression   . Prostate cancer   . Osteoarthritis   . Insomnia 12-10-12    not a problem now  . Erectile dysfunction   . Hypertension   . Hx of colonic polyps   . Hypothyroidism   . Stroke, lacunar     "mini- episodes of severe hand shaking"    History   Social History  . Marital Status: Single    Spouse Name: N/A    Number of Children: N/A  . Years of Education: N/A   Occupational History  . Not on file.   Social History Main Topics  . Smoking status: Never Smoker   . Smokeless tobacco: Current User    Types: Chew  . Alcohol Use: 1.0 oz/week    2 drink(s) per week     Comment: weekends  . Drug Use: No  . Sexual Activity: Yes   Other Topics Concern  . Not on file   Social History Narrative  . No narrative on file    Past Surgical History  Procedure Laterality Date  . Prostatectomy    . Tonsillectomy    . Knee surgery Left     open surgery  . Elbow surgery    . Tibia fracture surgery    . Hernia repair Left     LIH  . Back surgery  12-10-12    '12-Lumbar fusion-retained hardware  . Total knee arthroplasty Left 12/16/2012    Procedure: LEFT TOTAL KNEE ARTHROPLASTY;  Surgeon:  Gearlean Alf, MD;  Location: [facility];  Service: Orthopedics;  Laterality: Left;    Family History  Problem Relation Age of Onset  . Asthma Mother   . Heart attack Father   . Heart attack Sister   . Coronary artery disease      fhx    No Known Allergies  Current Outpatient Prescriptions on File Prior to Visit  Medication Sig Dispense Refill  . aspirin EC 81 MG tablet Take 81 mg by mouth every evening.       No current facility-administered medications on file prior to visit.    BP 118/80  Pulse 79  Temp(Src) 99.1 F (37.3 C) (Oral)  Resp 20  Ht 5\' 10"  (1.778 m)  Wt 243 lb (110.224 kg)  BMI 34.87 kg/m2  SpO2 97%      Review of Systems  Constitutional: Negative for fever, chills, appetite change and fatigue.  HENT: Negative for congestion, dental problem, ear pain, hearing loss, sore throat, tinnitus, trouble swallowing and voice change.   Eyes: Negative for pain, discharge and visual disturbance.  Respiratory: Negative for cough, chest tightness, wheezing and stridor.   Cardiovascular: Negative for chest pain, palpitations and leg swelling.  Gastrointestinal: Negative for nausea, vomiting, abdominal pain, diarrhea, constipation, blood in stool and abdominal distention.  Genitourinary: Negative for urgency, hematuria, flank pain, discharge, difficulty urinating and genital sores.  Musculoskeletal: Negative for arthralgias, back pain, gait problem, joint swelling, myalgias and neck stiffness.  Skin: Negative for rash.  Neurological: Negative for dizziness, syncope, speech difficulty, weakness, numbness and headaches.  Hematological: Negative for adenopathy. Does not bruise/bleed easily.  Psychiatric/Behavioral: Negative for behavioral problems and dysphoric mood. The patient is not nervous/anxious.        Objective:   Physical Exam  Constitutional: He is oriented to person, place, and time. He appears well-developed.  HENT:  Head: Normocephalic.  Right Ear:  External ear normal.  Left Ear: External ear normal.  Eyes: Conjunctivae and EOM are normal.  Neck: Normal range of motion.  Cardiovascular: Normal rate and normal heart sounds.   Dorsalis pedis pulses faint.  Posterior tibia pulses full  Pulmonary/Chest: Breath sounds normal.  Abdominal: Bowel sounds are normal.  Midline surgical scar, lower abdominal area  Musculoskeletal: Normal range of motion. He exhibits no edema and no tenderness.  Surgical scars, both knees  Neurological: He is alert and oriented to person, place, and time.  Psychiatric: He has a normal mood and affect. His behavior is normal.          Assessment & Plan:   Preventive health examination Hypothyroidism.  We'll adjust levothyroxin Colonic polyps.  We'll schedule followup colonoscopy Hypertension well controlled Dyslipidemia well controlled History of nephrolithiasis Osteoarthritis Impaired glucose tolerance

## 2013-10-27 NOTE — Telephone Encounter (Signed)
Relevant patient education assigned to patient using Emmi. ° °

## 2013-10-27 NOTE — Progress Notes (Signed)
Pre visit review using our clinic review tool, if applicable. No additional management support is needed unless otherwise documented below in the visit note. 

## 2013-10-29 ENCOUNTER — Encounter: Payer: Self-pay | Admitting: Gastroenterology

## 2013-12-22 ENCOUNTER — Ambulatory Visit (AMBULATORY_SURGERY_CENTER): Payer: Self-pay | Admitting: *Deleted

## 2013-12-22 VITALS — Ht 69.5 in | Wt 246.2 lb

## 2013-12-22 DIAGNOSIS — Z86010 Personal history of colonic polyps: Secondary | ICD-10-CM

## 2013-12-22 MED ORDER — NA SULFATE-K SULFATE-MG SULF 17.5-3.13-1.6 GM/177ML PO SOLN: 1.0000 | Freq: Once | ORAL | Status: DC

## 2013-12-22 NOTE — Progress Notes (Signed)
No egg or soy allergy. ewm No home 02 or c pap use. ewm No diet pills. ewm No blood thinners. ewm No problems with past sedation. ewm Pt declined emmi. ewm

## 2013-12-29 ENCOUNTER — Encounter: Payer: Self-pay | Admitting: Gastroenterology

## 2014-01-05 ENCOUNTER — Encounter: Payer: Self-pay | Admitting: Gastroenterology

## 2014-01-05 ENCOUNTER — Ambulatory Visit (AMBULATORY_SURGERY_CENTER): Payer: BC Managed Care – PPO | Admitting: Gastroenterology

## 2014-01-05 VITALS — BP 130/80 | HR 68 | Temp 98.3°F | Resp 21 | Ht 69.5 in | Wt 246.0 lb

## 2014-01-05 DIAGNOSIS — Z86010 Personal history of colonic polyps: Secondary | ICD-10-CM

## 2014-01-05 DIAGNOSIS — K5730 Diverticulosis of large intestine without perforation or abscess without bleeding: Secondary | ICD-10-CM

## 2014-01-05 MED ORDER — SODIUM CHLORIDE 0.9 % IV SOLN: 500.0000 mL | INTRAVENOUS | Status: DC

## 2014-01-05 NOTE — Progress Notes (Signed)
Patient denies any allergies to eggs or soy. Patient denies any problems with anesthesia/sedation. Patient denies any oxygen use at home.

## 2014-01-05 NOTE — Patient Instructions (Signed)

## 2014-01-05 NOTE — Progress Notes (Signed)
Report to PACU, RN, vss, BBS= Clear.  

## 2014-01-05 NOTE — Op Note (Signed)
[facility]  [address] [place], [id number]   COLONOSCOPY PROCEDURE REPORT  PATIENT: [name], [name]  MR#: [id number] BIRTHDATE: [birth year] , 89  yrs. old GENDER: male ENDOSCOPIST: Inda Castle, MD REFERRED [id number] Burnice Logan, M.D. PROCEDURE DATE:  01/05/2014 PROCEDURE:   Colonoscopy, diagnostic First Screening Colonoscopy - Avg.  risk and is 50 yrs.  old or older - No.  Prior Negative Screening - Now for repeat screening. N/A  History of Adenoma - Now for follow-up colonoscopy & has been > or = to 3 yrs.  Yes hx of adenoma.  Has been 3 or more years since last colonoscopy.  Polyps Removed Today? No.  Recommend repeat exam, <10 yrs? No. ASA CLASS:   Class II INDICATIONS:high risk personal history of colonic polyps. 2010 MEDICATIONS: Monitored anesthesia care and Propofol 320 mg IV  DESCRIPTION OF PROCEDURE:   After the risks benefits and alternatives of the procedure were thoroughly explained, informed consent was obtained.  The digital rectal exam revealed no abnormalities of the rectum.   The LB [id number], LB [id number], and LB [id number]  endoscope was introduced through the anus and advanced to the cecum, which was identified by both the appendix and ileocecal valve. No adverse events experienced. The quality of the prep was excellent using Suprep  The instrument was then slowly withdrawn as the colon was fully examined.      COLON FINDINGS: There was moderate diverticulosis noted in the sigmoid colon and descending colon.   The examination was otherwise normal.  Retroflexed views revealed no abnormalities. The time to cecum=3 minutes 07 seconds.  Withdrawal time=6 minutes 43 seconds. The scope was withdrawn and the procedure completed. COMPLICATIONS: There were no immediate complications.  ENDOSCOPIC IMPRESSION: 1.   Moderate diverticulosis was noted in the sigmoid colon and descending colon 2.   The examination was  otherwise normal  RECOMMENDATIONS: Colonoscopy 10 years  eSigned:  Inda Castle, MD 01/05/2014 11:46 AM   cc:

## 2014-01-06 ENCOUNTER — Telehealth: Payer: Self-pay | Admitting: *Deleted

## 2014-01-06 NOTE — Telephone Encounter (Signed)
  Follow up Call-  Call back number 01/05/2014  Post procedure Call Back phone  # [phone]  Permission to leave phone message Yes     Patient questions:  Do you have a fever, pain , or abdominal swelling? No. Pain Score  0 *  Have you tolerated food without any problems? Yes.    Have you been able to return to your normal activities? Yes.    Do you have any questions about your discharge instructions: Diet   No. Medications  No. Follow up visit  No.  Do you have questions or concerns about your Care? No.  Actions: * If pain score is 4 or above: No action needed, pain <4.

## 2014-03-13 ENCOUNTER — Other Ambulatory Visit: Payer: Self-pay | Admitting: Internal Medicine

## 2014-08-19 ENCOUNTER — Telehealth: Payer: Self-pay | Admitting: Internal Medicine

## 2014-08-19 NOTE — Telephone Encounter (Signed)
Patient Name: [name]  DOB: [birth year]    Initial Comment Caller states lightheaded and headaches.    Nurse Assessment  Nurse: Luther Parody, RN, Malachy Mood Date/Time (Eastern Time): 08/19/2014 4:44:23 PM  Confirm and document reason for call. If symptomatic, describe symptoms. ---Caller states that he has been having h/a's with dizziness for the last 2 wks. States that the dizziness occurs more often with position change. States that he has a mild h/a at this time and no dizziness currently.  Has the patient traveled out of the country within the last 30 days? ---Not Applicable  Does the patient require triage? ---Yes  Related visit to physician within the last 2 weeks? ---No  Does the PT have any chronic conditions? (i.e. diabetes, asthma, etc.) ---Yes  List chronic conditions. ---htn, high cholesterol, depression,     Guidelines    Guideline Title Affirmed Question Affirmed Notes  Dizziness - Lightheadedness [1] MODERATE dizziness (e.g., interferes with normal activities) AND [2] has NOT been evaluated by physician for this (Exception: dizziness caused by heat exposure, sudden standing, or poor fluid intake)    Final Disposition User   See Physician within [place], Therapist, sports, [place]

## 2014-08-20 ENCOUNTER — Ambulatory Visit (INDEPENDENT_AMBULATORY_CARE_PROVIDER_SITE_OTHER): Payer: Medicare Other | Admitting: Family Medicine

## 2014-08-20 ENCOUNTER — Ambulatory Visit (INDEPENDENT_AMBULATORY_CARE_PROVIDER_SITE_OTHER)
Admission: RE | Admit: 2014-08-20 | Discharge: 2014-08-20 | Disposition: A | Discharge status: Home / Self Care | Payer: Medicare Other | Source: Ambulatory Visit | Attending: Family Medicine | Admitting: Family Medicine

## 2014-08-20 ENCOUNTER — Encounter: Payer: Self-pay | Admitting: Family Medicine

## 2014-08-20 VITALS — BP 130/80 | HR 78 | Temp 98.6°F | Wt 238.0 lb

## 2014-08-20 DIAGNOSIS — R519 Headache, unspecified: Secondary | ICD-10-CM

## 2014-08-20 DIAGNOSIS — R42 Dizziness and giddiness: Secondary | ICD-10-CM | POA: Diagnosis not present

## 2014-08-20 DIAGNOSIS — R51 Headache: Secondary | ICD-10-CM

## 2014-08-20 LAB — SEDIMENTATION RATE: Sed Rate: 11 mm/hr (ref 0–22)

## 2014-08-20 NOTE — Patient Instructions (Signed)

## 2014-08-20 NOTE — Progress Notes (Signed)
Subjective:    Patient ID: [name], male    DOB: 10-25-1950, 64 y.o.   MRN: [id number]  HPI  Patient seen with 2 week history of headaches. Generally does not suffer headaches. His headaches are unilateral and right sided. They radiate from the right retro-orbital region back to the temporal and parietal area. Headaches are intermittent. Usually last about 30-40 minutes. Occasionally sharp. No clear triggers. He's taken tramadol and aspirin with mild relief. Symptoms have been somewhat progressive. No recent injury. No associated photophobia. No worsening with activity.  Denies any associated fever, visual changes, syncope, nausea, vomiting, sinusitis symptoms such as congestion, or any rashes. No history of migraine headaches. He does have occasional associated lightheadedness. He has remote history of prostate cancer. PSA last summer was 0.  Past Medical History  Diagnosis Date  . Allergy   . Depression   . Prostate cancer   . Osteoarthritis   . Insomnia 12-10-12    not a problem now  . Erectile dysfunction   . Hypertension   . Hx of colonic polyps   . Hypothyroidism   . Stroke, lacunar     "mini- episodes of severe hand shaking"  . Stroke 2013    TIA   Past Surgical History  Procedure Laterality Date  . Prostatectomy    . Tonsillectomy    . Knee surgery Left     open surgery  . Elbow surgery    . Tibia fracture surgery    . Hernia repair Left     LIH  . Back surgery  12-10-12    '12-Lumbar fusion-retained hardware  . Total knee arthroplasty Left 12/16/2012    Procedure: LEFT TOTAL KNEE ARTHROPLASTY;  Surgeon: Gearlean Alf, MD;  Location: [facility];  Service: Orthopedics;  Laterality: Left;  . Colonoscopy    . Polypectomy      reports that he has never smoked. His smokeless tobacco use includes Chew. He reports that he drinks about 1.0 oz of alcohol per week. He reports that he does not use illicit drugs. family history includes Asthma in his mother; Colon  cancer in his paternal aunt; Coronary artery disease in an other family member; Heart attack in his father and sister. No Known Allergies   Review of Systems  Constitutional: Negative for fatigue.  HENT: Negative for trouble swallowing.   Eyes: Negative for visual disturbance.  Respiratory: Negative for cough, chest tightness and shortness of breath.   Cardiovascular: Negative for chest pain, palpitations and leg swelling.  Gastrointestinal: Negative for abdominal pain.  Neurological: Positive for light-headedness and headaches. Negative for dizziness, seizures, syncope and weakness.       Objective:   Physical Exam  Constitutional: He is oriented to person, place, and time. He appears well-developed and well-nourished.  HENT:  Head: Normocephalic and atraumatic.  No temporal artery tenderness  Eyes: EOM are normal. Pupils are equal, round, and reactive to light.  Neck: Neck supple. No thyromegaly present.  Cardiovascular: Normal rate and regular rhythm.   Pulmonary/Chest: Effort normal and breath sounds normal. No respiratory distress. He has no wheezes. He has no rales.  Musculoskeletal: He exhibits no edema.  Lymphadenopathy:    He has no cervical adenopathy.  Neurological: He is alert and oriented to person, place, and time. No cranial nerve deficit. Coordination normal.  Full-strength throughout. Gait normal. Cerebellar normal finger to nose testing.  Skin: No rash noted.  Psychiatric: He has a normal mood and affect. His behavior is normal.  Assessment & Plan:  Atypical headache. Patient seen with unilateral right-sided headache of 2 weeks duration. Does not meet criteria for migraine. Does not have any red flag symptoms such as sudden vision change. Given his age would check sedimentation rate. CT head without contrast. Follow-up immediately for any worsening headache or new symptoms

## 2014-08-20 NOTE — Telephone Encounter (Signed)
Patient has an appointment with Dr Elease Hashimoto

## 2014-08-20 NOTE — Progress Notes (Signed)
Pre visit review using our clinic review tool, if applicable. No additional management support is needed unless otherwise documented below in the visit note. 

## 2014-09-23 ENCOUNTER — Other Ambulatory Visit: Payer: Self-pay | Admitting: Internal Medicine

## 2014-11-27 ENCOUNTER — Other Ambulatory Visit: Payer: Self-pay | Admitting: Internal Medicine

## 2014-12-18 ENCOUNTER — Other Ambulatory Visit: Payer: Self-pay | Admitting: Internal Medicine

## 2014-12-18 NOTE — Telephone Encounter (Signed)
ok 

## 2014-12-21 NOTE — Telephone Encounter (Signed)
ok 

## 2015-03-23 ENCOUNTER — Telehealth: Payer: Self-pay | Admitting: Internal Medicine

## 2015-03-23 MED ORDER — TADALAFIL 20 MG PO TABS: 20.0000 mg | ORAL_TABLET | Freq: Every day | ORAL | Status: DC | PRN

## 2015-03-23 NOTE — Telephone Encounter (Signed)
Spoke to pt, told him Rx sent to pharmacy. Pt verbalized understanding.

## 2015-03-23 NOTE — Telephone Encounter (Signed)
Pt needs new rx cialis 20 mg#12 w/refill send to [facility]

## 2015-03-23 NOTE — Telephone Encounter (Signed)
ok 

## 2015-03-23 NOTE — Telephone Encounter (Signed)
Okay to fill Rx for Cialis 20 mg?

## 2015-04-09 ENCOUNTER — Encounter: Payer: Self-pay | Admitting: Internal Medicine

## 2015-04-09 ENCOUNTER — Ambulatory Visit (INDEPENDENT_AMBULATORY_CARE_PROVIDER_SITE_OTHER): Payer: Medicare Other | Admitting: Internal Medicine

## 2015-04-09 ENCOUNTER — Other Ambulatory Visit: Payer: Self-pay | Admitting: *Deleted

## 2015-04-09 VITALS — BP 140/90 | HR 72 | Temp 99.3°F | Resp 20 | Ht 69.5 in | Wt 234.0 lb

## 2015-04-09 DIAGNOSIS — E785 Hyperlipidemia, unspecified: Secondary | ICD-10-CM

## 2015-04-09 DIAGNOSIS — E039 Hypothyroidism, unspecified: Secondary | ICD-10-CM

## 2015-04-09 DIAGNOSIS — R1013 Epigastric pain: Secondary | ICD-10-CM

## 2015-04-09 DIAGNOSIS — I10 Essential (primary) hypertension: Secondary | ICD-10-CM

## 2015-04-09 DIAGNOSIS — K219 Gastro-esophageal reflux disease without esophagitis: Secondary | ICD-10-CM | POA: Diagnosis not present

## 2015-04-09 LAB — CBC WITH DIFFERENTIAL/PLATELET
BASOS ABS: 0.1 10*3/uL (ref 0.0–0.1)
BASOS PCT: 0.7 % (ref 0.0–3.0)
Eosinophils Absolute: 0.2 10*3/uL (ref 0.0–0.7)
Eosinophils Relative: 1.9 % (ref 0.0–5.0)
HEMATOCRIT: 50.5 % (ref 39.0–52.0)
Hemoglobin: 16.9 g/dL (ref 13.0–17.0)
LYMPHS PCT: 19.2 % (ref 12.0–46.0)
Lymphs Abs: 1.7 10*3/uL (ref 0.7–4.0)
MCHC: 33.5 g/dL (ref 30.0–36.0)
MCV: 93.6 fl (ref 78.0–100.0)
Monocytes Absolute: 0.8 10*3/uL (ref 0.1–1.0)
Monocytes Relative: 9 % (ref 3.0–12.0)
NEUTROS ABS: 6 10*3/uL (ref 1.4–7.7)
Neutrophils Relative %: 69.2 % (ref 43.0–77.0)
PLATELETS: 296 10*3/uL (ref 150.0–400.0)
RBC: 5.39 Mil/uL (ref 4.22–5.81)
RDW: 14.6 % (ref 11.5–15.5)
WBC: 8.7 10*3/uL (ref 4.0–10.5)

## 2015-04-09 LAB — COMPREHENSIVE METABOLIC PANEL
ALT: 24 U/L (ref 0–53)
AST: 22 U/L (ref 0–37)
Albumin: 4.7 g/dL (ref 3.5–5.2)
Alkaline Phosphatase: 86 U/L (ref 39–117)
BILIRUBIN TOTAL: 1 mg/dL (ref 0.2–1.2)
BUN: 20 mg/dL (ref 6–23)
CALCIUM: 10.5 mg/dL (ref 8.4–10.5)
CHLORIDE: 99 meq/L (ref 96–112)
CO2: 30 mEq/L (ref 19–32)
Creatinine, Ser: 1.15 mg/dL (ref 0.40–1.50)
GFR: 67.58 mL/min (ref >60.00)
Glucose, Bld: 120 mg/dL — ABNORMAL HIGH (ref 70–99)
Potassium: 4.2 mEq/L (ref 3.5–5.1)
Sodium: 137 mEq/L (ref 135–145)
Total Protein: 7.9 g/dL (ref 6.0–8.3)

## 2015-04-09 LAB — LIPID PANEL
CHOL/HDL RATIO: 3
Cholesterol: 124 mg/dL (ref 0–200)
HDL: 44.3 mg/dL (ref >39.00)
LDL CALC: 59 mg/dL (ref 0–99)
NONHDL: 79.52
Triglycerides: 103 mg/dL (ref 0.0–149.0)
VLDL: 20.6 mg/dL (ref 0.0–40.0)

## 2015-04-09 LAB — TSH: TSH: 5.42 u[IU]/mL — ABNORMAL HIGH (ref 0.35–4.50)

## 2015-04-09 LAB — AMYLASE: Amylase: 57 U/L (ref 27–131)

## 2015-04-09 MED ORDER — TADALAFIL 20 MG PO TABS: 20.0000 mg | ORAL_TABLET | Freq: Every day | ORAL | Status: DC | PRN

## 2015-04-09 MED ORDER — LEVOTHYROXINE SODIUM 88 MCG PO TABS: 88.0000 ug | ORAL_TABLET | Freq: Every day | ORAL | Status: DC

## 2015-04-09 MED ORDER — PANTOPRAZOLE SODIUM 40 MG PO TBEC: 40.0000 mg | DELAYED_RELEASE_TABLET | Freq: Every day | ORAL | Status: DC

## 2015-04-09 NOTE — Progress Notes (Signed)
Subjective:    Patient ID: [name], male    DOB: 06-Oct-1950, 65 y.o.   MRN: [id number]  HPI  65 year old patient who has treated hypertension.  He has hypothyroidism and osteoarthritis. He presents with a chief complaint of upper abdominal pain present for the past 1 or 2 weeks.  He describes some heartburn issues with regurgitation of sour acid contents.  Pain is usually precipitated by meals 30-60 minutes postprandially.  He describes epigastric aching.  No nausea or vomiting For the past few days he has had some low-grade fever since of chilliness and has been using Alka-Seltzer plus. No history of heartburn in the past. No recent lab  Past Medical History  Diagnosis Date  . Allergy   . Depression   . Prostate cancer ([place])   . Osteoarthritis   . Insomnia 12-10-12    not a problem now  . Erectile dysfunction   . Hypertension   . Hx of colonic polyps   . Hypothyroidism   . Stroke, lacunar ([place])     "mini- episodes of severe hand shaking"  . Stroke [facility]) 2013    TIA    Social History   Social History  . Marital Status: Single    Spouse Name: N/A  . Number of Children: N/A  . Years of Education: N/A   Occupational History  . Not on file.   Social History Main Topics  . Smoking status: Never Smoker   . Smokeless tobacco: Current User    Types: Chew  . Alcohol Use: 1.0 oz/week    2 drink(s) per week     Comment: weekends  . Drug Use: No  . Sexual Activity: Yes   Other Topics Concern  . Not on file   Social History Narrative    Past Surgical History  Procedure Laterality Date  . Prostatectomy    . Tonsillectomy    . Knee surgery Left     open surgery  . Elbow surgery    . Tibia fracture surgery    . Hernia repair Left     LIH  . Back surgery  12-10-12    '12-Lumbar fusion-retained hardware  . Total knee arthroplasty Left 12/16/2012    Procedure: LEFT TOTAL KNEE ARTHROPLASTY;  Surgeon: Gearlean Alf, MD;  Location: [facility];  Service:  Orthopedics;  Laterality: Left;  . Colonoscopy    . Polypectomy      Family History  Problem Relation Age of Onset  . Asthma Mother   . Heart attack Father   . Heart attack Sister   . Coronary artery disease      fhx  . Colon cancer Paternal Aunt     No Known Allergies  Current Outpatient Prescriptions on File Prior to Visit  Medication Sig Dispense Refill  . aspirin EC 81 MG tablet Take 81 mg by mouth every evening.    Marland Kitchen atorvastatin (LIPITOR) 40 MG tablet TAKE 1 TABLET BY MOUTH DAILY 90 tablet 1  . levothyroxine (SYNTHROID, LEVOTHROID) 75 MCG tablet TAKE 1 TABLET BY MOUTH DAILY 90 tablet 3  . lisinopril-hydrochlorothiazide (PRINZIDE,ZESTORETIC) 20-12.5 MG per tablet TAKE 1 TABLET BY MOUTH DAILY 90 tablet 1  . Multiple Vitamin (MULTIVITAMIN) tablet Take 1 tablet by mouth daily.    Marland Kitchen PARoxetine (PAXIL) 20 MG tablet Take 1 tablet (20 mg total) by mouth every evening. 90 tablet 3  . tadalafil (CIALIS) 20 MG tablet Take 1 tablet (20 mg total) by mouth daily as needed for erectile  dysfunction. 12 tablet 2  . traMADol (ULTRAM) 50 MG tablet TAKE 2 TABLETS BY MOUTH EVERY 6 HOURS ASNEEDED 60 tablet 2   No current facility-administered medications on file prior to visit.    BP 140/90 mmHg  Pulse 72  Temp(Src) 99.3 F (37.4 C) (Oral)  Resp 20  Ht 5' 9.5" (1.765 m)  Wt 234 lb (106.142 kg)  BMI 34.07 kg/m2  SpO2 98%     Review of Systems  Constitutional: Positive for fever, chills, activity change, appetite change and fatigue.  HENT: Negative for congestion, dental problem, ear pain, hearing loss, sore throat, tinnitus, trouble swallowing and voice change.   Eyes: Negative for pain, discharge and visual disturbance.  Respiratory: Negative for cough, chest tightness, wheezing and stridor.   Cardiovascular: Negative for chest pain, palpitations and leg swelling.  Gastrointestinal: Positive for abdominal pain. Negative for nausea, vomiting, diarrhea, constipation, blood in stool  and abdominal distention.  Genitourinary: Negative for urgency, hematuria, flank pain, discharge, difficulty urinating and genital sores.  Musculoskeletal: Negative for myalgias, back pain, joint swelling, arthralgias, gait problem and neck stiffness.  Skin: Negative for rash.  Neurological: Negative for dizziness, syncope, speech difficulty, weakness, numbness and headaches.  Hematological: Negative for adenopathy. Does not bruise/bleed easily.  Psychiatric/Behavioral: Negative for behavioral problems and dysphoric mood. The patient is not nervous/anxious.        Objective:   Physical Exam  Constitutional: He is oriented to person, place, and time. He appears well-developed.  HENT:  Head: Normocephalic.  Right Ear: External ear normal.  Left Ear: External ear normal.  Eyes: Conjunctivae and EOM are normal.  Neck: Normal range of motion.  Cardiovascular: Normal rate and normal heart sounds.   Pulmonary/Chest: Breath sounds normal.  Abdominal: Soft. Bowel sounds are normal. He exhibits no distension. There is tenderness. There is no rebound and no guarding.  Epigastric and left upper quadrant tenderness Active bowel sounds No guarding  Musculoskeletal: Normal range of motion. He exhibits no edema or tenderness.  Neurological: He is alert and oriented to person, place, and time.  Psychiatric: He has a normal mood and affect. His behavior is normal.          Assessment & Plan:  Abdominal pain /GERD.  We'll place on antireflux regimen and treat with Protonix.  We'll check lab including amylase Essential hypertension, stable Osteoarthritis Probably viral syndrome.  Alka-Seltzer plus will be discontinued.  Tylenol only recommended.  He will continue tramadol when necessary  Schedule CPX

## 2015-04-09 NOTE — Progress Notes (Signed)
Pre visit review using our clinic review tool, if applicable. No additional management support is needed unless otherwise documented below in the visit note. 

## 2015-04-09 NOTE — Patient Instructions (Addendum)
Avoids foods high in acid such as tomatoes citrus juices, and spicy foods.  Avoid eating within two hours of lying down or before exercising.  Do not overheat.  Try smaller more frequent meals.   Protonix 40 mg daily  Report any new or worsening symptomsGastroesophageal Reflux Disease, Adult Normally, food travels down the esophagus and stays in the stomach to be digested. However, when a person has gastroesophageal reflux disease (GERD), food and stomach acid move back up into the esophagus. When this happens, the esophagus becomes sore and inflamed. Over time, GERD can create small holes (ulcers) in the lining of the esophagus.  CAUSES This condition is caused by a problem with the muscle between the esophagus and the stomach (lower esophageal sphincter, or LES). Normally, the LES muscle closes after food passes through the esophagus to the stomach. When the LES is weakened or abnormal, it does not close properly, and that allows food and stomach acid to go back up into the esophagus. The LES can be weakened by certain dietary substances, medicines, and medical conditions, including:  Tobacco use.  Pregnancy.  Having a hiatal hernia.  Heavy alcohol use.  Certain foods and beverages, such as coffee, chocolate, onions, and peppermint. RISK FACTORS This condition is more likely to develop in:  People who have an increased body weight.  People who have connective tissue disorders.  People who use NSAID medicines. SYMPTOMS Symptoms of this condition include:  Heartburn.  Difficult or painful swallowing.  The feeling of having a lump in the throat.  Abitter taste in the mouth.  Bad breath.  Having a large amount of saliva.  Having an upset or bloated stomach.  Belching.  Chest pain.  Shortness of breath or wheezing.  Ongoing (chronic) cough or a night-time cough.  Wearing away of tooth enamel.  Weight loss. Different conditions can cause chest pain. Make sure to see  your health care provider if you experience chest pain. DIAGNOSIS Your health care provider will take a medical history and perform a physical exam. To determine if you have mild or severe GERD, your health care provider may also monitor how you respond to treatment. You may also have other tests, including:  An endoscopy toexamine your stomach and esophagus with a small camera.  A test thatmeasures the acidity level in your esophagus.  A test thatmeasures how much pressure is on your esophagus.  A barium swallow or modified barium swallow to show the shape, size, and functioning of your esophagus. TREATMENT The goal of treatment is to help relieve your symptoms and to prevent complications. Treatment for this condition may vary depending on how severe your symptoms are. Your health care provider may recommend:  Changes to your diet.  Medicine.  Surgery. HOME CARE INSTRUCTIONS Diet  Follow a diet as recommended by your health care provider. This may involve avoiding foods and drinks such as:  Coffee and tea (with or without caffeine).  Drinks that containalcohol.  Energy drinks and sports drinks.  Carbonated drinks or sodas.  Chocolate and cocoa.  Peppermint and mint flavorings.  Garlic and onions.  Horseradish.  Spicy and acidic foods, including peppers, chili powder, curry powder, vinegar, hot sauces, and barbecue sauce.  Citrus fruit juices and citrus fruits, such as oranges, lemons, and limes.  Tomato-based foods, such as red sauce, chili, salsa, and pizza with red sauce.  Fried and fatty foods, such as donuts, french fries, potato chips, and high-fat dressings.  High-fat meats, such as hot dogs  and fatty cuts of red and white meats, such as rib eye steak, sausage, ham, and bacon.  High-fat dairy items, such as whole milk, butter, and cream cheese.  Eat small, frequent meals instead of large meals.  Avoid drinking large amounts of liquid with your  meals.  Avoid eating meals during the 2-3 hours before bedtime.  Avoid lying down right after you eat.  Do not exercise right after you eat. General Instructions  Pay attention to any changes in your symptoms.  Take over-the-counter and prescription medicines only as told by your health care provider. Do not take aspirin, ibuprofen, or other NSAIDs unless your health care provider told you to do so.  Do not use any tobacco products, including cigarettes, chewing tobacco, and e-cigarettes. If you need help quitting, ask your health care provider.  Wear loose-fitting clothing. Do not wear anything tight around your waist that causes pressure on your abdomen.  Raise (elevate) the head of your bed 6 inches (15cm).  Try to reduce your stress, such as with yoga or meditation. If you need help reducing stress, ask your health care provider.  If you are overweight, reduce your weight to an amount that is healthy for you. Ask your health care provider for guidance about a safe weight loss goal.  Keep all follow-up visits as told by your health care provider. This is important. SEEK MEDICAL CARE IF:  You have new symptoms.  You have unexplained weight loss.  You have difficulty swallowing, or it hurts to swallow.  You have wheezing or a persistent cough.  Your symptoms do not improve with treatment.  You have a hoarse voice. SEEK IMMEDIATE MEDICAL CARE IF:  You have pain in your arms, neck, jaw, teeth, or back.  You feel sweaty, dizzy, or light-headed.  You have chest pain or shortness of breath.  You vomit and your vomit looks like blood or coffee grounds.  You faint.  Your stool is bloody or black.  You cannot swallow, drink, or eat.   This information is not intended to replace advice given to you by your health care provider. Make sure you discuss any questions you have with your health care provider.   Document Released: 11/30/2004 Document Revised: 11/11/2014  Document Reviewed: 06/17/2014 Elsevier Interactive Patient Education [employer].

## 2015-05-31 ENCOUNTER — Other Ambulatory Visit: Payer: Self-pay | Admitting: Internal Medicine

## 2015-06-23 ENCOUNTER — Ambulatory Visit (INDEPENDENT_AMBULATORY_CARE_PROVIDER_SITE_OTHER): Payer: Medicare Other | Admitting: Internal Medicine

## 2015-06-23 ENCOUNTER — Encounter: Payer: Self-pay | Admitting: Internal Medicine

## 2015-06-23 VITALS — BP 140/80 | HR 96 | Temp 99.3°F | Resp 20 | Ht 69.5 in | Wt 238.0 lb

## 2015-06-23 DIAGNOSIS — R300 Dysuria: Secondary | ICD-10-CM

## 2015-06-23 DIAGNOSIS — M159 Polyosteoarthritis, unspecified: Secondary | ICD-10-CM

## 2015-06-23 DIAGNOSIS — M150 Primary generalized (osteo)arthritis: Secondary | ICD-10-CM

## 2015-06-23 DIAGNOSIS — I10 Essential (primary) hypertension: Secondary | ICD-10-CM | POA: Diagnosis not present

## 2015-06-23 DIAGNOSIS — R21 Rash and other nonspecific skin eruption: Secondary | ICD-10-CM

## 2015-06-23 LAB — POCT URINALYSIS DIPSTICK
BILIRUBIN UA: NEGATIVE
Glucose, UA: NEGATIVE
KETONES UA: NEGATIVE
Leukocytes, UA: NEGATIVE
NITRITE UA: NEGATIVE
PH UA: 6.5
Protein, UA: NEGATIVE
SPEC GRAV UA: 1.025
Urobilinogen, UA: 0.2

## 2015-06-23 MED ORDER — TERBINAFINE HCL 1 % EX CREA: 1.0000 "application " | TOPICAL_CREAM | Freq: Two times a day (BID) | CUTANEOUS | Status: DC

## 2015-06-23 MED ORDER — TRAMADOL HCL 50 MG PO TABS: 50.0000 mg | ORAL_TABLET | Freq: Four times a day (QID) | ORAL | Status: DC | PRN

## 2015-06-23 NOTE — Progress Notes (Signed)
Pre visit review using our clinic review tool, if applicable. No additional management support is needed unless otherwise documented below in the visit note. 

## 2015-06-23 NOTE — Patient Instructions (Signed)
HOME CARE INSTRUCTIONS  Skin care  Keep the affected area dry.  Do not use ointments or creams. These can make the condition worse.  Apply cool compresses to the affected areas.  Do not scratch your skin.  Do not take hot showers or baths. General Instructions  Take over-the-counter and prescription medicines only as told by your health care provider.  If you were prescribed an antibiotic, take it as told by your health care provider. Do not stop taking it even if your condition improves.  Stay in a cool room as much as possible. Use an air conditioner or fan, if possible.  Do not wear tight clothes. Wear comfortable, loose-fitting clothing.  Keep all follow-up visits as told by your health care provider. This is important. SEEK MEDICAL CARE IF:  Your heat rash does not go away within several days.  Your heat rash gets worse.  You have a fever or chills.           High-Fiber Diet Fiber, also called dietary fiber, is a type of carbohydrate found in fruits, vegetables, whole grains, and beans. A high-fiber diet can have many health benefits. Your health care provider may recommend a high-fiber diet to help:  Prevent constipation. Fiber can make your bowel movements more regular.  Lower your cholesterol.  Relieve hemorrhoids, uncomplicated diverticulosis, or irritable bowel syndrome.  Prevent overeating as part of a weight-loss plan.  Prevent heart disease, type 2 diabetes, and certain cancers. WHAT IS MY PLAN? The recommended daily intake of fiber includes:  38 grams for men under age 33.  66 grams for men over age 50.  56 grams for women under age 23.  24 grams for women over age 39. You can get the recommended daily intake of dietary fiber by eating a variety of fruits, vegetables, grains, and beans. Your health care provider may also recommend a fiber supplement if it is not possible to get enough fiber through your diet. WHAT DO I NEED TO KNOW ABOUT A HIGH-FIBER  DIET?  Fiber supplements have not been widely studied for their effectiveness, so it is better to get fiber through food sources.  Always check the fiber content on thenutrition facts label of any prepackaged food. Look for foods that contain at least 5 grams of fiber per serving.  Ask your dietitian if you have questions about specific foods that are related to your condition, especially if those foods are not listed in the following section.  Increase your daily fiber consumption gradually. Increasing your intake of dietary fiber too quickly may cause bloating, cramping, or gas.  Drink plenty of water. Water helps you to digest fiber. WHAT FOODS CAN I EAT? Grains Whole-grain breads. Multigrain cereal. Oats and oatmeal. Brown rice. Barley. Bulgur wheat. [place]. Bran muffins. Popcorn. Rye wafer crackers. Vegetables Sweet potatoes. Spinach. Kale. Artichokes. Cabbage. Broccoli. Green peas. Carrots. Squash. Fruits Berries. Pears. Apples. Oranges. Avocados. Prunes and raisins. Dried figs. Meats and Other Protein Sources Navy, kidney, pinto, and soy beans. Split peas. Lentils. Nuts and seeds. Dairy Fiber-fortified yogurt. Beverages Fiber-fortified soy milk. Fiber-fortified orange juice. Other Fiber bars. The items listed above may not be a complete list of recommended foods or beverages. Contact your dietitian for more options. WHAT FOODS ARE NOT RECOMMENDED? Grains White bread. Pasta made with refined flour. White rice. Vegetables Fried potatoes. Canned vegetables. Well-cooked vegetables.  Fruits Fruit juice. Cooked, strained fruit. Meats and Other Protein Sources Fatty cuts of meat. Fried Sales executive or fried fish. Dairy Milk.  Yogurt. Cream cheese. Sour cream. Beverages Soft drinks. Other Cakes and pastries. Butter and oils. The items listed above may not be a complete list of foods and beverages to avoid. Contact your dietitian for more information. WHAT ARE SOME TIPS FOR  INCLUDING HIGH-FIBER FOODS IN MY DIET?  Eat a wide variety of high-fiber foods.  Make sure that half of all grains consumed each day are whole grains.  Replace breads and cereals made from refined flour or white flour with whole-grain breads and cereals.  Replace white rice with brown rice, bulgur wheat, or millet.  Start the day with a breakfast that is high in fiber, such as a cereal that contains at least 5 grams of fiber per serving.  Use beans in place of meat in soups, salads, or pasta.  Eat high-fiber snacks, such as berries, raw vegetables, nuts, or popcorn.   This information is not intended to replace advice given to you by your health care provider. Make sure you discuss any questions you have with your health care provider.   Document Released: 02/20/2005 Document Revised: 03/13/2014 Document Reviewed: 08/05/2013 Elsevier Interactive Patient Education [employer].

## 2015-06-23 NOTE — Progress Notes (Signed)
Subjective:    Patient ID: [name], male    DOB: 1951-03-02, 65 y.o.   MRN: [id number]  HPI  65 year old patient who presents with a number of concerns.  He states for the past 2 or 3 weeks he is having 3-5 bowel movements every morning.  These apparently are fairly well formed, but then does well throughout the day.  He feels this is aggravating a groin rash.  No nocturnal symptoms. For the past week he has had some worsening right flank discomfort.  He does have a history of generalized osteoarthritis including some low back pain. Yesterday he had an episode of dysuria.  No reoccurrence.  Patient noted have a low-grade fever today Chief complaint perhaps is a worsening rash in the groin area  Past Medical History  Diagnosis Date  . Allergy   . Depression   . Prostate cancer ([place])   . Osteoarthritis   . Insomnia 12-10-12    not a problem now  . Erectile dysfunction   . Hypertension   . Hx of colonic polyps   . Hypothyroidism   . Stroke, lacunar ([place])     "mini- episodes of severe hand shaking"  . Stroke [facility]) 2013    TIA     Social History   Social History  . Marital Status: Single    Spouse Name: N/A  . Number of Children: N/A  . Years of Education: N/A   Occupational History  . Not on file.   Social History Main Topics  . Smoking status: Never Smoker   . Smokeless tobacco: Current User    Types: Chew  . Alcohol Use: 1.0 oz/week    2 drink(s) per week     Comment: weekends  . Drug Use: No  . Sexual Activity: Yes   Other Topics Concern  . Not on file   Social History Narrative    Past Surgical History  Procedure Laterality Date  . Prostatectomy    . Tonsillectomy    . Knee surgery Left     open surgery  . Elbow surgery    . Tibia fracture surgery    . Hernia repair Left     LIH  . Back surgery  12-10-12    '12-Lumbar fusion-retained hardware  . Total knee arthroplasty Left 12/16/2012    Procedure: LEFT TOTAL KNEE ARTHROPLASTY;  Surgeon:  Gearlean Alf, MD;  Location: [facility];  Service: Orthopedics;  Laterality: Left;  . Colonoscopy    . Polypectomy      Family History  Problem Relation Age of Onset  . Asthma Mother   . Heart attack Father   . Heart attack Sister   . Coronary artery disease      fhx  . Colon cancer Paternal Aunt     No Known Allergies  Current Outpatient Prescriptions on File Prior to Visit  Medication Sig Dispense Refill  . aspirin EC 81 MG tablet Take 81 mg by mouth every evening.    Marland Kitchen atorvastatin (LIPITOR) 40 MG tablet TAKE 1 TABLET BY MOUTH DAILY 90 tablet 1  . levothyroxine (SYNTHROID, LEVOTHROID) 88 MCG tablet Take 1 tablet (88 mcg total) by mouth daily. 30 tablet 1  . lisinopril-hydrochlorothiazide (PRINZIDE,ZESTORETIC) 20-12.5 MG per tablet TAKE 1 TABLET BY MOUTH DAILY 90 tablet 1  . Multiple Vitamin (MULTIVITAMIN) tablet Take 1 tablet by mouth daily.    . pantoprazole (PROTONIX) 40 MG tablet Take 1 tablet (40 mg total) by mouth daily. 30 tablet 3  .  PARoxetine (PAXIL) 20 MG tablet Take 1 tablet (20 mg total) by mouth every evening. 90 tablet 3  . tadalafil (CIALIS) 20 MG tablet Take 1 tablet (20 mg total) by mouth daily as needed for erectile dysfunction. 12 tablet 2   No current facility-administered medications on file prior to visit.    BP 140/80 mmHg  Pulse 96  Temp(Src) 99.3 F (37.4 C) (Oral)  Resp 20  Ht 5' 9.5" (1.765 m)  Wt 238 lb (107.956 kg)  BMI 34.65 kg/m2  SpO2 98%     Review of Systems  Constitutional: Negative for fever, chills, appetite change and fatigue.  HENT: Negative for congestion, dental problem, ear pain, hearing loss, sore throat, tinnitus, trouble swallowing and voice change.   Eyes: Negative for pain, discharge and visual disturbance.  Respiratory: Negative for cough, chest tightness, wheezing and stridor.   Cardiovascular: Negative for chest pain, palpitations and leg swelling.  Gastrointestinal: Positive for diarrhea. Negative for nausea,  vomiting, abdominal pain, constipation, blood in stool and abdominal distention.  Genitourinary: Negative for urgency, hematuria, flank pain, discharge, difficulty urinating and genital sores.  Musculoskeletal: Positive for back pain and arthralgias. Negative for myalgias, joint swelling, gait problem and neck stiffness.  Skin: Positive for rash.  Neurological: Negative for dizziness, syncope, speech difficulty, weakness, numbness and headaches.  Hematological: Negative for adenopathy. Does not bruise/bleed easily.  Psychiatric/Behavioral: Negative for behavioral problems and dysphoric mood. The patient is not nervous/anxious.        Objective:   Physical Exam  Constitutional: He is oriented to person, place, and time. He appears well-developed.  HENT:  Head: Normocephalic.  Right Ear: External ear normal.  Left Ear: External ear normal.  Eyes: Conjunctivae and EOM are normal.  Neck: Normal range of motion.  Cardiovascular: Normal rate and normal heart sounds.   Pulmonary/Chest: Breath sounds normal.  Abdominal: Bowel sounds are normal. He exhibits no distension. There is no tenderness. There is no rebound.  Musculoskeletal: Normal range of motion. He exhibits no edema or tenderness.  Laminectomy scar No CVA tenderness  Neurological: He is alert and oriented to person, place, and time.  Skin:  Erythematous rash in the intertriginous areas of the groin  Psychiatric: He has a normal mood and affect. His behavior is normal.          Assessment & Plan:   Groin rash.  Probably heat rash with superimposed candidal infection.  Will treat with Lamisil cream.  Local wound care discussed Change in bowel habits.  Will place on a high-fiber diet and observe Osteoarthritis Dysuria.  Will check a UA  Patient will call if unimproved

## 2015-06-25 ENCOUNTER — Other Ambulatory Visit: Payer: Self-pay | Admitting: Internal Medicine

## 2015-09-17 ENCOUNTER — Ambulatory Visit: Payer: Medicare Other

## 2015-09-24 ENCOUNTER — Ambulatory Visit: Payer: Medicare Other

## 2015-10-11 ENCOUNTER — Telehealth: Payer: Self-pay | Admitting: Internal Medicine

## 2015-10-11 NOTE — Telephone Encounter (Signed)
°  Left message for pt to cm and set an appt. Dr Raliegh Ip said need appt for medical clearance

## 2015-10-12 NOTE — Telephone Encounter (Signed)
Pt has been scheduled.  °

## 2015-10-19 ENCOUNTER — Ambulatory Visit (INDEPENDENT_AMBULATORY_CARE_PROVIDER_SITE_OTHER): Payer: Medicare Other | Admitting: Internal Medicine

## 2015-10-19 ENCOUNTER — Encounter: Payer: Self-pay | Admitting: Internal Medicine

## 2015-10-19 VITALS — BP 128/90 | HR 71 | Temp 98.4°F | Ht 69.5 in | Wt 246.0 lb

## 2015-10-19 DIAGNOSIS — I10 Essential (primary) hypertension: Secondary | ICD-10-CM

## 2015-10-19 DIAGNOSIS — M1711 Unilateral primary osteoarthritis, right knee: Secondary | ICD-10-CM | POA: Diagnosis not present

## 2015-10-19 DIAGNOSIS — R7302 Impaired glucose tolerance (oral): Secondary | ICD-10-CM | POA: Diagnosis not present

## 2015-10-19 LAB — POCT GLYCOSYLATED HEMOGLOBIN (HGB A1C): HEMOGLOBIN A1C: 5.6

## 2015-10-19 NOTE — Patient Instructions (Signed)

## 2015-10-19 NOTE — Progress Notes (Signed)
Pre visit review using our clinic review tool, if applicable. No additional management support is needed unless otherwise documented below in the visit note. 

## 2015-10-19 NOTE — Progress Notes (Signed)
Subjective:    Patient ID: [name], male    DOB: 09-08-1950, 65 y.o.   MRN: [id number]  HPI  65 year old patient who has a history of essential hypertension.  He is seen today for preoperative medical clearance for a left total knee replacement surgery. He has essential hypertension and dyslipidemia.  No cardiopulmonary complaints.  He states he does walk twice daily with good exercise capacity, although limited by knee pain.  He does some weight training as well He is status post left total knee replacement in 2014 No new concerns or complaints He has been compliant with his medications  Past Medical History:  Diagnosis Date  . Allergy   . Depression   . Erectile dysfunction   . Hx of colonic polyps   . Hypertension   . Hypothyroidism   . Insomnia 12-10-12   not a problem now  . Osteoarthritis   . Prostate cancer ([place])   . Stroke [facility]) 2013   TIA  . Stroke, lacunar ([place])    "mini- episodes of severe hand shaking"     Social History   Social History  . Marital status: Single    Spouse name: N/A  . Number of children: N/A  . Years of education: N/A   Occupational History  . Not on file.   Social History Main Topics  . Smoking status: Never Smoker  . Smokeless tobacco: Current User    Types: Chew  . Alcohol use 1.0 oz/week    2 drink(s) per week     Comment: weekends  . Drug use: No  . Sexual activity: Yes   Other Topics Concern  . Not on file   Social History Narrative  . No narrative on file    Past Surgical History:  Procedure Laterality Date  . BACK SURGERY  12-10-12   '12-Lumbar fusion-retained hardware  . COLONOSCOPY    . ELBOW SURGERY    . HERNIA REPAIR Left    LIH  . KNEE SURGERY Left    open surgery  . POLYPECTOMY    . PROSTATECTOMY    . TIBIA FRACTURE SURGERY    . TONSILLECTOMY    . TOTAL KNEE ARTHROPLASTY Left 12/16/2012   Procedure: LEFT TOTAL KNEE ARTHROPLASTY;  Surgeon: Gearlean Alf, MD;  Location: [facility];  Service:  Orthopedics;  Laterality: Left;    Family History  Problem Relation Age of Onset  . Asthma Mother   . Heart attack Father   . Heart attack Sister   . Coronary artery disease      fhx  . Colon cancer Paternal Aunt     No Known Allergies  Current Outpatient Prescriptions on File Prior to Visit  Medication Sig Dispense Refill  . aspirin EC 81 MG tablet Take 81 mg by mouth every evening.    Marland Kitchen atorvastatin (LIPITOR) 40 MG tablet TAKE 1 TABLET BY MOUTH DAILY 90 tablet 1  . levothyroxine (SYNTHROID, LEVOTHROID) 88 MCG tablet Take 1 tablet (88 mcg total) by mouth daily. 30 tablet 1  . lisinopril-hydrochlorothiazide (PRINZIDE,ZESTORETIC) 20-12.5 MG per tablet TAKE 1 TABLET BY MOUTH DAILY 90 tablet 1  . Multiple Vitamin (MULTIVITAMIN) tablet Take 1 tablet by mouth daily.    . pantoprazole (PROTONIX) 40 MG tablet Take 1 tablet (40 mg total) by mouth daily. 30 tablet 3  . PARoxetine (PAXIL) 20 MG tablet TAKE 1 TABLET BY MOUTH DAILY IN THE EVENING 90 tablet 1  . tadalafil (CIALIS) 20 MG tablet Take 1 tablet (  20 mg total) by mouth daily as needed for erectile dysfunction. 12 tablet 2  . terbinafine (LAMISIL) 1 % cream Apply 1 application topically 2 (two) times daily. 60 g 2  . traMADol (ULTRAM) 50 MG tablet Take 1 tablet (50 mg total) by mouth every 6 (six) hours as needed. 90 tablet 2   No current facility-administered medications on file prior to visit.     BP 128/90 (BP Location: Left Arm, Patient Position: Sitting, Cuff Size: Normal)   Pulse 71   Temp 98.4 F (36.9 C) (Oral)   Ht 5' 9.5" (1.765 m)   Wt 246 lb (111.6 kg)   SpO2 96%   BMI 35.81 kg/m     Review of Systems  Constitutional: Negative for appetite change, chills, fatigue and fever.  HENT: Negative for congestion, dental problem, ear pain, hearing loss, sore throat, tinnitus, trouble swallowing and voice change.   Eyes: Negative for pain, discharge and visual disturbance.  Respiratory: Negative for cough, chest  tightness, wheezing and stridor.   Cardiovascular: Negative for chest pain, palpitations and leg swelling.  Gastrointestinal: Negative for abdominal distention, abdominal pain, blood in stool, constipation, diarrhea, nausea and vomiting.  Genitourinary: Negative for difficulty urinating, discharge, flank pain, genital sores, hematuria and urgency.  Musculoskeletal: Positive for arthralgias and gait problem. Negative for back pain, joint swelling, myalgias and neck stiffness.       Right knee pain  Skin: Negative for rash.  Neurological: Negative for dizziness, syncope, speech difficulty, weakness, numbness and headaches.  Hematological: Negative for adenopathy. Does not bruise/bleed easily.  Psychiatric/Behavioral: Negative for behavioral problems and dysphoric mood. The patient is not nervous/anxious.        Objective:   Physical Exam  Constitutional: He is oriented to person, place, and time. He appears well-developed.   Blood pressure 130/90 Weight 246  HENT:  Head: Normocephalic.  Right Ear: External ear normal.  Left Ear: External ear normal.  Eyes: Conjunctivae and EOM are normal.  Neck: Normal range of motion.  Cardiovascular: Normal rate and normal heart sounds.   Pulmonary/Chest: Breath sounds normal.  Abdominal: Bowel sounds are normal.  Musculoskeletal: Normal range of motion. He exhibits no edema or tenderness.  Status post left total knee replacement surgery  Neurological: He is alert and oriented to person, place, and time.  Psychiatric: He has a normal mood and affect. His behavior is normal.          Assessment & Plan:   Hypertension.  Continue present antihypertensive regimen Dyslipidemia.  Continue atorvastatin Obesity.  Weight loss encouraged Endstage osteoarthritis, right knee.  Medically cleared for surgery  Weight loss encouraged Low-salt diet recommended Home blood pressure monitoring recommended  Recheck 6 months  Nyoka Cowden,  MD

## 2015-10-20 ENCOUNTER — Other Ambulatory Visit: Payer: Self-pay | Admitting: Orthopedic Surgery

## 2015-11-04 ENCOUNTER — Other Ambulatory Visit: Payer: Self-pay | Admitting: Internal Medicine

## 2015-11-04 NOTE — Telephone Encounter (Signed)
Ok to refill 

## 2015-11-26 ENCOUNTER — Other Ambulatory Visit: Payer: Self-pay

## 2015-11-26 ENCOUNTER — Encounter (HOSPITAL_COMMUNITY): Payer: Self-pay

## 2015-11-26 ENCOUNTER — Encounter (HOSPITAL_COMMUNITY)
Admission: RE | Admit: 2015-11-26 | Discharge: 2015-11-26 | Disposition: A | Discharge status: Home / Self Care | Payer: Medicare Other | Source: Ambulatory Visit | Attending: Orthopedic Surgery | Admitting: Orthopedic Surgery

## 2015-11-26 ENCOUNTER — Ambulatory Visit (HOSPITAL_COMMUNITY)
Admission: RE | Admit: 2015-11-26 | Discharge: 2015-11-26 | Disposition: A | Discharge status: Home / Self Care | Payer: Medicare Other | Source: Ambulatory Visit | Attending: Orthopedic Surgery | Admitting: Orthopedic Surgery

## 2015-11-26 DIAGNOSIS — Z01818 Encounter for other preprocedural examination: Secondary | ICD-10-CM | POA: Insufficient documentation

## 2015-11-26 DIAGNOSIS — Z96659 Presence of unspecified artificial knee joint: Secondary | ICD-10-CM | POA: Diagnosis not present

## 2015-11-26 DIAGNOSIS — Z471 Aftercare following joint replacement surgery: Secondary | ICD-10-CM | POA: Diagnosis not present

## 2015-11-26 HISTORY — DX: Pneumonia, unspecified organism: J18.9

## 2015-11-26 HISTORY — DX: Other chronic pain: G89.29

## 2015-11-26 HISTORY — DX: Transient cerebral ischemic attack, unspecified: G45.9

## 2015-11-26 HISTORY — DX: Personal history of other diseases of the respiratory system: Z87.09

## 2015-11-26 HISTORY — DX: Gastro-esophageal reflux disease without esophagitis: K21.9

## 2015-11-26 HISTORY — DX: Nocturia: R35.1

## 2015-11-26 HISTORY — DX: Hyperlipidemia, unspecified: E78.5

## 2015-11-26 HISTORY — DX: Dorsalgia, unspecified: M54.9

## 2015-11-26 HISTORY — DX: Pain in unspecified joint: M25.50

## 2015-11-26 HISTORY — DX: Personal history of urinary calculi: Z87.442

## 2015-11-26 LAB — SURGICAL PCR SCREEN
MRSA, PCR: NEGATIVE
Staphylococcus aureus: NEGATIVE

## 2015-11-26 LAB — CBC WITH DIFFERENTIAL/PLATELET
BASOS PCT: 0 %
Basophils Absolute: 0 10*3/uL (ref 0.0–0.1)
EOS ABS: 0.2 10*3/uL (ref 0.0–0.7)
Eosinophils Relative: 2 %
HCT: 50.3 % (ref 39.0–52.0)
Hemoglobin: 16.8 g/dL (ref 13.0–17.0)
Lymphocytes Relative: 24 %
Lymphs Abs: 2 10*3/uL (ref 0.7–4.0)
MCH: 30.8 pg (ref 26.0–34.0)
MCHC: 33.4 g/dL (ref 30.0–36.0)
MCV: 92.3 fL (ref 78.0–100.0)
MONO ABS: 0.8 10*3/uL (ref 0.1–1.0)
MONOS PCT: 9 %
NEUTROS PCT: 65 %
Neutro Abs: 5.6 10*3/uL (ref 1.7–7.7)
PLATELETS: 276 10*3/uL (ref 150–400)
RBC: 5.45 MIL/uL (ref 4.22–5.81)
RDW: 13.8 % (ref 11.5–15.5)
WBC: 8.6 10*3/uL (ref 4.0–10.5)

## 2015-11-26 LAB — COMPREHENSIVE METABOLIC PANEL
ALBUMIN: 4.2 g/dL (ref 3.5–5.0)
ALT: 27 U/L (ref 17–63)
ANION GAP: 11 (ref 5–15)
AST: 25 U/L (ref 15–41)
Alkaline Phosphatase: 78 U/L (ref 38–126)
BUN: 20 mg/dL (ref 6–20)
CO2: 21 mmol/L — AB (ref 22–32)
Calcium: 10 mg/dL (ref 8.9–10.3)
Chloride: 104 mmol/L (ref 101–111)
Creatinine, Ser: 1.12 mg/dL (ref 0.61–1.24)
GFR calc Af Amer: >60 mL/min (ref >60)
GFR calc non Af Amer: >60 mL/min (ref >60)
GLUCOSE: 117 mg/dL — AB (ref 65–99)
POTASSIUM: 3.8 mmol/L (ref 3.5–5.1)
SODIUM: 136 mmol/L (ref 135–145)
TOTAL PROTEIN: 7.4 g/dL (ref 6.5–8.1)
Total Bilirubin: 1 mg/dL (ref 0.3–1.2)

## 2015-11-26 LAB — URINALYSIS, ROUTINE W REFLEX MICROSCOPIC
GLUCOSE, UA: NEGATIVE mg/dL
Ketones, ur: NEGATIVE mg/dL
LEUKOCYTES UA: NEGATIVE
Nitrite: NEGATIVE
PH: 6 (ref 5.0–8.0)
PROTEIN: NEGATIVE mg/dL
Specific Gravity, Urine: 1.027 (ref 1.005–1.030)

## 2015-11-26 LAB — URINE MICROSCOPIC-ADD ON

## 2015-11-26 MED ORDER — CHLORHEXIDINE GLUCONATE 4 % EX LIQD: 60.0000 mL | Freq: Once | CUTANEOUS | Status: DC

## 2015-11-26 NOTE — Progress Notes (Signed)
Cardiologist denies  Medical Md is Dr.Peter Burnice Logan  Echo report in epic from 2007  Stress test done > 5 yrs ago  Heart cath denies  EKG denies in past yr  CXR denies in past yr

## 2015-11-26 NOTE — Pre-Procedure Instructions (Signed)
[name]  11/26/2015      [facility] - [place] - [address] [place] [address] Phone: [phone] Fax: [phone]    Your procedure is scheduled on Tues, Oct 2 @ 10:35 AM  Report to [facility] Admitting at 8:30 AM  Call this number if you have problems the morning of surgery:  [phone]   Remember:  Do not eat food or drink liquids after midnight.  Take these medicines the morning of surgery with A SIP OF WATER Synthroid(Levothyroxine),Pantoprazole (Protonix),and Tramadol(Ultram-if needed)             Stop taking your Aspirin along with any Vitamins or Herbal Medications. No Goody's,BC's,Aleve,Advil,Motrin,or Fish Oil.    Do not wear jewelry.  Do not wear lotions, powders,colognes, or deoderant.   Men may shave face and neck.  Do not bring valuables to the hospital.  [facility] is not responsible for any belongings or valuables.  Contacts, dentures or bridgework may not be worn into surgery.  Leave your suitcase in the car.  After surgery it may be brought to your room.  For patients admitted to the hospital, discharge time will be determined by your treatment team.  Patients discharged the day of surgery will not be allowed to drive home.    [facility]  Before surgery, you can play an important role.  Because skin is not sterile, your skin needs to be as free of germs as possible.  You can reduce the number of germs on you skin by washing with CHG (chlorahexidine gluconate) soap before surgery.  CHG is an antiseptic cleaner which kills germs and bonds with the skin to continue killing germs even after washing.  Please DO NOT use if you have an allergy to CHG or antibacterial soaps.  If your skin becomes reddened/irritated stop using the CHG and inform your nurse when you arrive at Short Stay.  Do not shave (including legs and  underarms) for at least 48 hours prior to the first CHG shower.  You may shave your face.  Please follow these instructions carefully:   1.  Shower with CHG Soap the night before surgery and the                                morning of Surgery.  2.  If you choose to wash your hair, wash your hair first as usual with your       normal shampoo.  3.  After you shampoo, rinse your hair and body thoroughly to remove the                      Shampoo.  4.  Use CHG as you would any other liquid soap.  You can apply chg directly       to the skin and wash gently with scrungie or a clean washcloth.  5.  Apply the CHG Soap to your body ONLY FROM THE NECK DOWN.        Do not use on open wounds or open sores.  Avoid contact with your eyes,       ears, mouth and genitals (private parts).  Wash genitals (private parts)       with your normal soap.  6.  Wash thoroughly, paying special attention to the area where  your surgery        will be performed.  7.  Thoroughly rinse your body with warm water from the neck down.  8.  DO NOT shower/wash with your normal soap after using and rinsing off       the CHG Soap.  9.  Pat yourself dry with a clean towel.            10.  Wear clean pajamas.            11.  Place clean sheets on your bed the night of your first shower and do not        sleep with pets.  Day of Surgery  Do not apply any lotions/deoderants the morning of surgery.  Please wear clean clothes to the hospital/surgery center.    Please read over the following fact sheets that you were given. Pain Booklet, MRSA Information and Surgical Site Infection Prevention

## 2015-12-03 MED ORDER — TRANEXAMIC ACID 1000 MG/10ML IV SOLN
1000.0000 mg | INTRAVENOUS | Status: AC
  Administered 2015-12-06: 1000 mg via INTRAVENOUS
  Filled 2015-12-03: qty 10

## 2015-12-06 ENCOUNTER — Encounter (HOSPITAL_COMMUNITY)
Admission: RE | Disposition: A | Discharge status: Home / Self Care | Payer: Self-pay | Source: Ambulatory Visit | Attending: Orthopedic Surgery

## 2015-12-06 ENCOUNTER — Encounter (HOSPITAL_COMMUNITY): Payer: Self-pay | Admitting: *Deleted

## 2015-12-06 ENCOUNTER — Inpatient Hospital Stay (HOSPITAL_COMMUNITY): Payer: Medicare Other | Admitting: Certified Registered Nurse Anesthetist

## 2015-12-06 ENCOUNTER — Inpatient Hospital Stay (HOSPITAL_COMMUNITY)
Admission: RE | Admit: 2015-12-06 | Discharge: 2015-12-08 | DRG: 470 | Disposition: A | Discharge status: Home / Self Care | Payer: Medicare Other | Source: Ambulatory Visit | Attending: Orthopedic Surgery | Admitting: Orthopedic Surgery

## 2015-12-06 DIAGNOSIS — Z86010 Personal history of colonic polyps: Secondary | ICD-10-CM

## 2015-12-06 DIAGNOSIS — Z9889 Other specified postprocedural states: Secondary | ICD-10-CM

## 2015-12-06 DIAGNOSIS — F17220 Nicotine dependence, chewing tobacco, uncomplicated: Secondary | ICD-10-CM | POA: Diagnosis present

## 2015-12-06 DIAGNOSIS — Z8249 Family history of ischemic heart disease and other diseases of the circulatory system: Secondary | ICD-10-CM | POA: Diagnosis not present

## 2015-12-06 DIAGNOSIS — K219 Gastro-esophageal reflux disease without esophagitis: Secondary | ICD-10-CM | POA: Diagnosis present

## 2015-12-06 DIAGNOSIS — Z8546 Personal history of malignant neoplasm of prostate: Secondary | ICD-10-CM | POA: Diagnosis not present

## 2015-12-06 DIAGNOSIS — M1711 Unilateral primary osteoarthritis, right knee: Principal | ICD-10-CM | POA: Diagnosis present

## 2015-12-06 DIAGNOSIS — Z825 Family history of asthma and other chronic lower respiratory diseases: Secondary | ICD-10-CM | POA: Diagnosis not present

## 2015-12-06 DIAGNOSIS — Z800 Family history of malignant neoplasm of digestive organs: Secondary | ICD-10-CM | POA: Diagnosis not present

## 2015-12-06 DIAGNOSIS — Z96652 Presence of left artificial knee joint: Secondary | ICD-10-CM | POA: Diagnosis present

## 2015-12-06 DIAGNOSIS — Z7982 Long term (current) use of aspirin: Secondary | ICD-10-CM | POA: Diagnosis not present

## 2015-12-06 DIAGNOSIS — G8929 Other chronic pain: Secondary | ICD-10-CM | POA: Diagnosis present

## 2015-12-06 DIAGNOSIS — E039 Hypothyroidism, unspecified: Secondary | ICD-10-CM | POA: Diagnosis present

## 2015-12-06 DIAGNOSIS — G8918 Other acute postprocedural pain: Secondary | ICD-10-CM | POA: Diagnosis not present

## 2015-12-06 DIAGNOSIS — M549 Dorsalgia, unspecified: Secondary | ICD-10-CM | POA: Diagnosis present

## 2015-12-06 DIAGNOSIS — M179 Osteoarthritis of knee, unspecified: Secondary | ICD-10-CM | POA: Diagnosis not present

## 2015-12-06 DIAGNOSIS — I10 Essential (primary) hypertension: Secondary | ICD-10-CM | POA: Diagnosis present

## 2015-12-06 DIAGNOSIS — C61 Malignant neoplasm of prostate: Secondary | ICD-10-CM | POA: Diagnosis present

## 2015-12-06 DIAGNOSIS — Z96659 Presence of unspecified artificial knee joint: Secondary | ICD-10-CM

## 2015-12-06 DIAGNOSIS — G459 Transient cerebral ischemic attack, unspecified: Secondary | ICD-10-CM | POA: Diagnosis present

## 2015-12-06 DIAGNOSIS — R2681 Unsteadiness on feet: Secondary | ICD-10-CM

## 2015-12-06 HISTORY — PX: TOTAL KNEE ARTHROPLASTY: SHX125

## 2015-12-06 SURGERY — ARTHROPLASTY, KNEE, TOTAL
Anesthesia: Regional | Laterality: Right

## 2015-12-06 MED ORDER — LEVOTHYROXINE SODIUM 88 MCG PO TABS
88.0000 ug | ORAL_TABLET | Freq: Every day | ORAL | Status: DC
  Administered 2015-12-06 – 2015-12-08 (×3): 88 ug via ORAL
  Filled 2015-12-06 (×3): qty 1

## 2015-12-06 MED ORDER — ONDANSETRON HCL 4 MG PO TABS: 4.0000 mg | ORAL_TABLET | Freq: Four times a day (QID) | ORAL | Status: DC | PRN

## 2015-12-06 MED ORDER — BUPIVACAINE-EPINEPHRINE (PF) 0.25% -1:200000 IJ SOLN
INTRAMUSCULAR | Status: AC
  Filled 2015-12-06: qty 30

## 2015-12-06 MED ORDER — ACETAMINOPHEN 325 MG PO TABS: 650.0000 mg | ORAL_TABLET | Freq: Four times a day (QID) | ORAL | Status: DC | PRN

## 2015-12-06 MED ORDER — FENTANYL CITRATE (PF) 100 MCG/2ML IJ SOLN
INTRAMUSCULAR | Status: DC | PRN
  Administered 2015-12-06 (×6): 50 ug via INTRAVENOUS

## 2015-12-06 MED ORDER — FENTANYL CITRATE (PF) 100 MCG/2ML IJ SOLN
INTRAMUSCULAR | Status: AC
  Filled 2015-12-06: qty 2

## 2015-12-06 MED ORDER — ACETAMINOPHEN 500 MG PO TABS
1000.0000 mg | ORAL_TABLET | Freq: Once | ORAL | Status: AC
  Administered 2015-12-06: 1000 mg via ORAL
  Filled 2015-12-06: qty 2

## 2015-12-06 MED ORDER — LISINOPRIL-HYDROCHLOROTHIAZIDE 20-12.5 MG PO TABS: 1.0000 | ORAL_TABLET | Freq: Every day | ORAL | Status: DC

## 2015-12-06 MED ORDER — ONDANSETRON HCL 4 MG/2ML IJ SOLN
INTRAMUSCULAR | Status: AC
  Filled 2015-12-06: qty 2

## 2015-12-06 MED ORDER — CEFAZOLIN SODIUM-DEXTROSE 2-4 GM/100ML-% IV SOLN
2.0000 g | INTRAVENOUS | Status: AC
  Administered 2015-12-06: 2 g via INTRAVENOUS
  Filled 2015-12-06: qty 100

## 2015-12-06 MED ORDER — CELECOXIB 200 MG PO CAPS
200.0000 mg | ORAL_CAPSULE | Freq: Two times a day (BID) | ORAL | Status: DC
  Administered 2015-12-06 – 2015-12-08 (×4): 200 mg via ORAL
  Filled 2015-12-06 (×4): qty 1

## 2015-12-06 MED ORDER — METOPROLOL TARTRATE 5 MG/5ML IV SOLN
INTRAVENOUS | Status: AC
  Filled 2015-12-06: qty 5

## 2015-12-06 MED ORDER — HYDROMORPHONE HCL 1 MG/ML IJ SOLN
INTRAMUSCULAR | Status: AC
  Filled 2015-12-06: qty 1

## 2015-12-06 MED ORDER — FENTANYL CITRATE (PF) 100 MCG/2ML IJ SOLN
INTRAMUSCULAR | Status: AC
  Administered 2015-12-06: 50 ug via INTRAVENOUS
  Filled 2015-12-06: qty 2

## 2015-12-06 MED ORDER — SODIUM CHLORIDE 0.9 % IV SOLN
INTRAVENOUS | Status: DC
  Administered 2015-12-06: 17:00:00 via INTRAVENOUS

## 2015-12-06 MED ORDER — 0.9 % SODIUM CHLORIDE (POUR BTL) OPTIME
TOPICAL | Status: DC | PRN
  Administered 2015-12-06: 1000 mL

## 2015-12-06 MED ORDER — ALUM & MAG HYDROXIDE-SIMETH 200-200-20 MG/5ML PO SUSP: 30.0000 mL | ORAL | Status: DC | PRN

## 2015-12-06 MED ORDER — BUPIVACAINE LIPOSOME 1.3 % IJ SUSP
20.0000 mL | INTRAMUSCULAR | Status: DC
Start: 2015-12-06 — End: 2015-12-06
  Filled 2015-12-06: qty 20

## 2015-12-06 MED ORDER — FLEET ENEMA 7-19 GM/118ML RE ENEM: 1.0000 | ENEMA | Freq: Once | RECTAL | Status: DC | PRN

## 2015-12-06 MED ORDER — LACTATED RINGERS IV SOLN
INTRAVENOUS | Status: DC | PRN
  Administered 2015-12-06 (×2): via INTRAVENOUS

## 2015-12-06 MED ORDER — LISINOPRIL 20 MG PO TABS
20.0000 mg | ORAL_TABLET | Freq: Every day | ORAL | Status: DC
  Administered 2015-12-06 – 2015-12-08 (×3): 20 mg via ORAL
  Filled 2015-12-06 (×3): qty 1

## 2015-12-06 MED ORDER — ONDANSETRON HCL 4 MG/2ML IJ SOLN
INTRAMUSCULAR | Status: DC | PRN
  Administered 2015-12-06: 4 mg via INTRAVENOUS

## 2015-12-06 MED ORDER — DIPHENHYDRAMINE HCL 12.5 MG/5ML PO ELIX
12.5000 mg | ORAL_SOLUTION | ORAL | Status: DC | PRN
  Administered 2015-12-07 (×2): 25 mg via ORAL
  Filled 2015-12-06 (×2): qty 10

## 2015-12-06 MED ORDER — ROPIVACAINE HCL 7.5 MG/ML IJ SOLN
INTRAMUSCULAR | Status: DC | PRN
  Administered 2015-12-06: 20 mL via PERINEURAL

## 2015-12-06 MED ORDER — MIDAZOLAM HCL 2 MG/2ML IJ SOLN
INTRAMUSCULAR | Status: AC
  Administered 2015-12-06: 2 mg via INTRAVENOUS
  Filled 2015-12-06: qty 2

## 2015-12-06 MED ORDER — MIDAZOLAM HCL 2 MG/2ML IJ SOLN
INTRAMUSCULAR | Status: AC
  Filled 2015-12-06: qty 2

## 2015-12-06 MED ORDER — ASPIRIN EC 325 MG PO TBEC
325.0000 mg | DELAYED_RELEASE_TABLET | Freq: Two times a day (BID) | ORAL | Status: DC
  Administered 2015-12-06 – 2015-12-08 (×4): 325 mg via ORAL
  Filled 2015-12-06 (×4): qty 1

## 2015-12-06 MED ORDER — GLYCOPYRROLATE 0.2 MG/ML IJ SOLN
INTRAMUSCULAR | Status: DC | PRN
  Administered 2015-12-06: 0.2 mg via INTRAVENOUS

## 2015-12-06 MED ORDER — METOCLOPRAMIDE HCL 5 MG PO TABS: 5.0000 mg | ORAL_TABLET | Freq: Three times a day (TID) | ORAL | Status: DC | PRN

## 2015-12-06 MED ORDER — ACETAMINOPHEN 650 MG RE SUPP: 650.0000 mg | Freq: Four times a day (QID) | RECTAL | Status: DC | PRN

## 2015-12-06 MED ORDER — SODIUM CHLORIDE 0.9 % IV SOLN
1000.0000 mg | Freq: Once | INTRAVENOUS | Status: AC
  Administered 2015-12-06: 1000 mg via INTRAVENOUS
  Filled 2015-12-06 (×2): qty 10

## 2015-12-06 MED ORDER — SODIUM CHLORIDE 0.9 % IV SOLN: INTRAVENOUS | Status: DC

## 2015-12-06 MED ORDER — CEFAZOLIN IN D5W 1 GM/50ML IV SOLN
1.0000 g | Freq: Four times a day (QID) | INTRAVENOUS | Status: AC
  Administered 2015-12-06 – 2015-12-07 (×2): 1 g via INTRAVENOUS
  Filled 2015-12-06 (×3): qty 50

## 2015-12-06 MED ORDER — DEXAMETHASONE SODIUM PHOSPHATE 10 MG/ML IJ SOLN
8.0000 mg | Freq: Once | INTRAMUSCULAR | Status: AC
  Administered 2015-12-06: 8 mg via INTRAVENOUS
  Filled 2015-12-06: qty 1

## 2015-12-06 MED ORDER — ATORVASTATIN CALCIUM 40 MG PO TABS
40.0000 mg | ORAL_TABLET | Freq: Every day | ORAL | Status: DC
  Administered 2015-12-06 – 2015-12-08 (×3): 40 mg via ORAL
  Filled 2015-12-06 (×3): qty 1

## 2015-12-06 MED ORDER — BUPIVACAINE-EPINEPHRINE (PF) 0.5% -1:200000 IJ SOLN
INTRAMUSCULAR | Status: AC
  Filled 2015-12-06: qty 30

## 2015-12-06 MED ORDER — KETAMINE HCL-SODIUM CHLORIDE 100-0.9 MG/10ML-% IV SOSY
PREFILLED_SYRINGE | INTRAVENOUS | Status: AC
  Filled 2015-12-06: qty 10

## 2015-12-06 MED ORDER — ONDANSETRON HCL 4 MG/2ML IJ SOLN: 4.0000 mg | Freq: Four times a day (QID) | INTRAMUSCULAR | Status: DC | PRN

## 2015-12-06 MED ORDER — BISACODYL 5 MG PO TBEC: 5.0000 mg | DELAYED_RELEASE_TABLET | Freq: Every day | ORAL | Status: DC | PRN

## 2015-12-06 MED ORDER — SODIUM CHLORIDE 0.9 % IJ SOLN
INTRAMUSCULAR | Status: DC | PRN
  Administered 2015-12-06: 20 mL

## 2015-12-06 MED ORDER — GABAPENTIN 300 MG PO CAPS
300.0000 mg | ORAL_CAPSULE | Freq: Three times a day (TID) | ORAL | Status: DC
  Administered 2015-12-06 – 2015-12-08 (×6): 300 mg via ORAL
  Filled 2015-12-06 (×6): qty 1

## 2015-12-06 MED ORDER — PHENOL 1.4 % MT LIQD: 1.0000 | OROMUCOSAL | Status: DC | PRN

## 2015-12-06 MED ORDER — HYDROMORPHONE HCL 1 MG/ML IJ SOLN
1.0000 mg | INTRAMUSCULAR | Status: DC | PRN
Start: 2015-12-06 — End: 2015-12-08
  Administered 2015-12-06 – 2015-12-07 (×5): 1 mg via INTRAVENOUS
  Filled 2015-12-06 (×6): qty 1

## 2015-12-06 MED ORDER — LACTATED RINGERS IV SOLN
INTRAVENOUS | Status: DC
  Administered 2015-12-06: 50 mL/h via INTRAVENOUS

## 2015-12-06 MED ORDER — BUPIVACAINE-EPINEPHRINE 0.5% -1:200000 IJ SOLN
INTRAMUSCULAR | Status: DC | PRN
  Administered 2015-12-06: 30 mL

## 2015-12-06 MED ORDER — HYDROMORPHONE HCL 1 MG/ML IJ SOLN
0.2500 mg | INTRAMUSCULAR | Status: DC | PRN
  Administered 2015-12-06 (×2): 0.5 mg via INTRAVENOUS

## 2015-12-06 MED ORDER — SENNOSIDES-DOCUSATE SODIUM 8.6-50 MG PO TABS: 1.0000 | ORAL_TABLET | Freq: Every evening | ORAL | Status: DC | PRN

## 2015-12-06 MED ORDER — HYDROCHLOROTHIAZIDE 12.5 MG PO CAPS
12.5000 mg | ORAL_CAPSULE | Freq: Every day | ORAL | Status: DC
  Administered 2015-12-06 – 2015-12-08 (×3): 12.5 mg via ORAL
  Filled 2015-12-06 (×3): qty 1

## 2015-12-06 MED ORDER — OXYCODONE HCL 5 MG PO TABS
5.0000 mg | ORAL_TABLET | ORAL | Status: DC | PRN
  Administered 2015-12-06 – 2015-12-08 (×7): 10 mg via ORAL
  Filled 2015-12-06 (×7): qty 2

## 2015-12-06 MED ORDER — MENTHOL 3 MG MT LOZG
1.0000 | LOZENGE | OROMUCOSAL | Status: DC | PRN
  Administered 2015-12-06 – 2015-12-07 (×2): 3 mg via ORAL
  Filled 2015-12-06 (×2): qty 9

## 2015-12-06 MED ORDER — PROMETHAZINE HCL 25 MG/ML IJ SOLN: 6.2500 mg | INTRAMUSCULAR | Status: DC | PRN

## 2015-12-06 MED ORDER — PANTOPRAZOLE SODIUM 40 MG PO TBEC
40.0000 mg | DELAYED_RELEASE_TABLET | Freq: Every day | ORAL | Status: DC
  Administered 2015-12-06 – 2015-12-08 (×3): 40 mg via ORAL
  Filled 2015-12-06 (×3): qty 1

## 2015-12-06 MED ORDER — DEXAMETHASONE SODIUM PHOSPHATE 10 MG/ML IJ SOLN
10.0000 mg | Freq: Once | INTRAMUSCULAR | Status: AC
  Administered 2015-12-07: 10 mg via INTRAVENOUS
  Filled 2015-12-06: qty 1

## 2015-12-06 MED ORDER — SODIUM CHLORIDE 0.9 % IR SOLN
Status: DC | PRN
Start: 2015-12-06 — End: 2015-12-06
  Administered 2015-12-06: 1000 mL

## 2015-12-06 MED ORDER — KETAMINE HCL 10 MG/ML IJ SOLN
INTRAMUSCULAR | Status: DC | PRN
  Administered 2015-12-06 (×2): 10 mg via INTRAVENOUS
  Administered 2015-12-06: 40 mg via INTRAVENOUS

## 2015-12-06 MED ORDER — PAROXETINE HCL 20 MG PO TABS
20.0000 mg | ORAL_TABLET | Freq: Every evening | ORAL | Status: DC
  Administered 2015-12-06 – 2015-12-07 (×2): 20 mg via ORAL
  Filled 2015-12-06 (×3): qty 1

## 2015-12-06 MED ORDER — ZOLPIDEM TARTRATE 5 MG PO TABS: 5.0000 mg | ORAL_TABLET | Freq: Every evening | ORAL | Status: DC | PRN

## 2015-12-06 MED ORDER — METOCLOPRAMIDE HCL 5 MG/ML IJ SOLN: 5.0000 mg | Freq: Three times a day (TID) | INTRAMUSCULAR | Status: DC | PRN

## 2015-12-06 MED ORDER — DOCUSATE SODIUM 100 MG PO CAPS
100.0000 mg | ORAL_CAPSULE | Freq: Two times a day (BID) | ORAL | Status: DC
  Administered 2015-12-06 – 2015-12-08 (×4): 100 mg via ORAL
  Filled 2015-12-06 (×4): qty 1

## 2015-12-06 MED ORDER — METHOCARBAMOL 500 MG PO TABS
500.0000 mg | ORAL_TABLET | Freq: Four times a day (QID) | ORAL | Status: DC | PRN
  Administered 2015-12-06 – 2015-12-08 (×4): 500 mg via ORAL
  Filled 2015-12-06 (×4): qty 1

## 2015-12-06 MED ORDER — METHOCARBAMOL 1000 MG/10ML IJ SOLN
500.0000 mg | Freq: Four times a day (QID) | INTRAMUSCULAR | Status: DC | PRN
  Filled 2015-12-06: qty 5

## 2015-12-06 MED ORDER — OXYCODONE HCL ER 10 MG PO T12A
10.0000 mg | EXTENDED_RELEASE_TABLET | Freq: Two times a day (BID) | ORAL | Status: DC
  Administered 2015-12-06 – 2015-12-08 (×4): 10 mg via ORAL
  Filled 2015-12-06 (×4): qty 1

## 2015-12-06 SURGICAL SUPPLY — 63 items
BANDAGE ACE 6X5 VEL STRL LF (GAUZE/BANDAGES/DRESSINGS) ×3 IMPLANT
BANDAGE ESMARK 6X9 LF (GAUZE/BANDAGES/DRESSINGS) ×1 IMPLANT
BLADE PATELLA REAM PILOT HOLE (BLADE) ×3 IMPLANT
BLADE SAGITTAL 13X1.27X60 (BLADE) ×2 IMPLANT
BLADE SAGITTAL 13X1.27X60MM (BLADE) ×1
BLADE SAW SGTL 83.5X18.5 (BLADE) ×3 IMPLANT
BLADE SURG 10 STRL SS (BLADE) ×3 IMPLANT
BNDG ESMARK 6X9 LF (GAUZE/BANDAGES/DRESSINGS) ×3
BOWL SMART MIX CTS (DISPOSABLE) ×3 IMPLANT
CAPT KNEE TOTAL 3 ×3 IMPLANT
CEMENT BONE SIMPLEX SPEEDSET (Cement) ×6 IMPLANT
CLOSURE STERI-STRIP 1/2X4 (GAUZE/BANDAGES/DRESSINGS) ×1
CLOSURE WOUND 1/2 X4 (GAUZE/BANDAGES/DRESSINGS) ×1
CLSR STERI-STRIP ANTIMIC 1/2X4 (GAUZE/BANDAGES/DRESSINGS) ×2 IMPLANT
COVER SURGICAL LIGHT HANDLE (MISCELLANEOUS) ×3 IMPLANT
CUFF TOURNIQUET SINGLE 34IN LL (TOURNIQUET CUFF) ×3 IMPLANT
DRAPE EXTREMITY T 121X128X90 (DRAPE) ×3 IMPLANT
DRAPE INCISE IOBAN 66X45 STRL (DRAPES) ×6 IMPLANT
DRAPE PROXIMA HALF (DRAPES) IMPLANT
DRAPE U-SHAPE 47X51 STRL (DRAPES) ×3 IMPLANT
DRSG AQUACEL AG ADV 3.5X10 (GAUZE/BANDAGES/DRESSINGS) ×3 IMPLANT
DRSG PAD ABDOMINAL 8X10 ST (GAUZE/BANDAGES/DRESSINGS) ×3 IMPLANT
DURAPREP 26ML APPLICATOR (WOUND CARE) ×6 IMPLANT
ELECT REM PT RETURN 9FT ADLT (ELECTROSURGICAL) ×3
ELECTRODE REM PT RTRN 9FT ADLT (ELECTROSURGICAL) ×1 IMPLANT
GLOVE BIOGEL M 7.0 STRL (GLOVE) IMPLANT
GLOVE BIOGEL PI IND STRL 7.5 (GLOVE) IMPLANT
GLOVE BIOGEL PI IND STRL 8.5 (GLOVE) ×5 IMPLANT
GLOVE BIOGEL PI INDICATOR 7.5 (GLOVE)
GLOVE BIOGEL PI INDICATOR 8.5 (GLOVE) ×10
GLOVE SURG ORTHO 8.0 STRL STRW (GLOVE) ×18 IMPLANT
GOWN STRL REUS W/ TWL LRG LVL3 (GOWN DISPOSABLE) ×1 IMPLANT
GOWN STRL REUS W/ TWL XL LVL3 (GOWN DISPOSABLE) ×2 IMPLANT
GOWN STRL REUS W/TWL 2XL LVL3 (GOWN DISPOSABLE) ×3 IMPLANT
GOWN STRL REUS W/TWL LRG LVL3 (GOWN DISPOSABLE) ×2
GOWN STRL REUS W/TWL XL LVL3 (GOWN DISPOSABLE) ×4
HANDPIECE INTERPULSE COAX TIP (DISPOSABLE) ×2
HOOD PEEL AWAY FACE SHEILD DIS (HOOD) ×9 IMPLANT
KIT BASIN OR (CUSTOM PROCEDURE TRAY) ×3 IMPLANT
KIT ROOM TURNOVER OR (KITS) ×3 IMPLANT
KNEE CAPITATED TOTAL 3 ×1 IMPLANT
MANIFOLD NEPTUNE II (INSTRUMENTS) ×3 IMPLANT
NEEDLE 22X1 1/2 (OR ONLY) (NEEDLE) ×6 IMPLANT
NS IRRIG 1000ML POUR BTL (IV SOLUTION) ×3 IMPLANT
PACK TOTAL JOINT (CUSTOM PROCEDURE TRAY) ×3 IMPLANT
PACK UNIVERSAL I (CUSTOM PROCEDURE TRAY) ×3 IMPLANT
PAD ARMBOARD 7.5X6 YLW CONV (MISCELLANEOUS) ×6 IMPLANT
PATELLA REAMER BLADE WITH PILOT HOLE 32MM DIAMETER ×3 IMPLANT
SET HNDPC FAN SPRY TIP SCT (DISPOSABLE) ×1 IMPLANT
STAPLER VISISTAT 35W (STAPLE) IMPLANT
STRIP CLOSURE SKIN 1/2X4 (GAUZE/BANDAGES/DRESSINGS) ×2 IMPLANT
SUCTION FRAZIER HANDLE 10FR (MISCELLANEOUS)
SUCTION TUBE FRAZIER 10FR DISP (MISCELLANEOUS) IMPLANT
SUT BONE WAX W31G (SUTURE) ×3 IMPLANT
SUT VIC AB 0 CTB1 27 (SUTURE) ×6 IMPLANT
SUT VIC AB 1 CT1 27 (SUTURE) ×4
SUT VIC AB 1 CT1 27XBRD ANBCTR (SUTURE) ×2 IMPLANT
SUT VIC AB 2-0 CT1 27 (SUTURE) ×4
SUT VIC AB 2-0 CT1 TAPERPNT 27 (SUTURE) ×2 IMPLANT
SYR 20CC LL (SYRINGE) ×6 IMPLANT
TOWEL OR 17X24 6PK STRL BLUE (TOWEL DISPOSABLE) ×3 IMPLANT
TOWEL OR 17X26 10 PK STRL BLUE (TOWEL DISPOSABLE) ×3 IMPLANT
WATER STERILE IRR 1000ML POUR (IV SOLUTION) IMPLANT

## 2015-12-06 NOTE — Progress Notes (Signed)
Orthopedic Tech Progress Note Patient Details:  [name] 07/18/1950 [id number]  Patient ID: [name], male   DOB: 08-15-1950, 65 y.o.   MRN: [id number]   [name] 12/06/2015, 2:05 PMPatient refused CPM.

## 2015-12-06 NOTE — H&P (Signed)
[name] MRN:  [id number] DOB/SEX:  [birth year]male  CHIEF COMPLAINT:  Painful right Knee  HISTORY: Patient is a 65 y.o. male presented with a history of pain in the right knee. Onset of symptoms was gradual starting a few years ago with gradually worsening course since that time. Patient has been treated conservatively with over-the-counter NSAIDs and activity modification. Patient currently rates pain in the knee at 10 out of 10 with activity. There is pain at night.  PAST MEDICAL HISTORY: Patient Active Problem List   Diagnosis Date Noted  . Dysuria 06/23/2015  . Impaired glucose tolerance 10/27/2013  . OA (osteoarthritis) of knee 12/16/2012  . Sciatica 05/02/2010  . HYPOTHYROIDISM 08/06/2009  . DYSPRAXIA 08/06/2009  . COLONIC POLYPS, HX OF 08/06/2009  . URI 08/21/2007  . HYPERTENSION, BENIGN ESSENTIAL 10/15/2006  . DEPRESSION 10/11/2006  . ALLERGIC RHINITIS 10/11/2006  . Osteoarthritis 10/11/2006  . PROSTATE CANCER, HX OF 10/11/2006   Past Medical History:  Diagnosis Date  . Allergy   . Chronic back pain    DDD  . Depression    takes Paxil daily  . GERD (gastroesophageal reflux disease)    takes Protonix daily  . History of bronchitis    many yrs ago  . History of kidney stones   . Hx of colonic polyps   . Hyperlipidemia    takes Lipitor daily  . Hypertension    takes Lisinopril-HCTZ daily  . Hypothyroidism    takes Synthroid daily  . Insomnia 12-10-12   not a problem now  . Joint pain   . Nocturia   . Osteoarthritis   . Pneumonia 2012   hx of   . Prostate cancer ([place])   . TIA (transient ischemic attack)    takes ASA daily   Past Surgical History:  Procedure Laterality Date  . BACK SURGERY  12-10-12   '12-Lumbar fusion-retained hardware  . COLONOSCOPY    . ELBOW SURGERY    . HERNIA REPAIR Left    LIH  . KNEE SURGERY Left    open surgery  . POLYPECTOMY    . PROSTATECTOMY    . TIBIA FRACTURE SURGERY    . TONSILLECTOMY    . TOTAL KNEE  ARTHROPLASTY Left 12/16/2012   Procedure: LEFT TOTAL KNEE ARTHROPLASTY;  Surgeon: Gearlean Alf, MD;  Location: [facility];  Service: Orthopedics;  Laterality: Left;     MEDICATIONS:   No prescriptions prior to admission.    ALLERGIES:   Allergies  Allergen Reactions  . No Known Allergies     REVIEW OF SYSTEMS:  A comprehensive review of systems was negative except for: Musculoskeletal: positive for arthralgias   FAMILY HISTORY:   Family History  Problem Relation Age of Onset  . Asthma Mother   . Heart attack Father   . Heart attack Sister   . Coronary artery disease      fhx  . Colon cancer Paternal Aunt     SOCIAL HISTORY:   Social History  Substance Use Topics  . Smoking status: Never Smoker  . Smokeless tobacco: Current User    Types: Chew     Comment: quit smoking cigars in 1985  . Alcohol use 1.0 oz/week    2 Standard drinks or equivalent per week     Comment: occasionally     EXAMINATION:  Vital signs in last 24 hours:    There were no vitals taken for this visit.  General Appearance:    Alert, cooperative, no distress, appears  stated age  Head:    Normocephalic, without obvious abnormality, atraumatic  Eyes:    PERRL, conjunctiva/corneas clear, EOM's intact, fundi    benign, both eyes       Ears:    Normal TM's and external ear canals, both ears  Nose:   Nares normal, septum midline, mucosa normal, no drainage    or sinus tenderness  Throat:   Lips, mucosa, and tongue normal; teeth and gums normal  Neck:   Supple, symmetrical, trachea midline, no adenopathy;       thyroid:  No enlargement/tenderness/nodules; no carotid   bruit or JVD  Back:     Symmetric, no curvature, ROM normal, no CVA tenderness  Lungs:     Clear to auscultation bilaterally, respirations unlabored  Chest wall:    No tenderness or deformity  Heart:    Regular rate and rhythm, S1 and S2 normal, no murmur, rub   or gallop  Abdomen:     Soft, non-tender, bowel sounds active all four  quadrants,    no masses, no organomegaly  Genitalia:    Normal male without lesion, discharge or tenderness  Rectal:    Normal tone, normal prostate, no masses or tenderness;   guaiac negative stool  Extremities:   Extremities normal, atraumatic, no cyanosis or edema  Pulses:   2+ and symmetric all extremities  Skin:   Skin color, texture, turgor normal, no rashes or lesions  Lymph nodes:   Cervical, supraclavicular, and axillary nodes normal  Neurologic:   CNII-XII intact. Normal strength, sensation and reflexes      throughout     Musculoskeletal:  ROM 0-120, Ligaments inact,  Imaging Review Plain radiographs demonstrate severe degenerative joint disease of the right knee. The overall alignment is neutral. The bone quality appears to be excellent for age and reported activity level.  Assessment/Plan: Primary osteoarthritis, right knee   The patient history, physical examination and imaging studies are consistent with advanced degenerative joint disease of the right knee. The patient has failed conservative treatment.  The clearance notes were reviewed.  After discussion with the patient it was felt that Total Knee Replacement was indicated. The procedure,  risks, and benefits of total knee arthroplasty were presented and reviewed. The risks including but not limited to aseptic loosening, infection, blood clots, vascular injury, stiffness, patella tracking problems complications among others were discussed. The patient acknowledged the explanation, agreed to proceed with the plan. Donia Ast 12/06/2015, 6:35 AM

## 2015-12-06 NOTE — Anesthesia Preprocedure Evaluation (Addendum)
Anesthesia Evaluation  Patient identified by MRN, date of birth, ID band Patient awake    Reviewed: Allergy & Precautions, NPO status , Patient's Chart, lab work & pertinent test results  Airway Mallampati: II  TM Distance: >3 FB Neck ROM: Full    Dental   Pulmonary    breath sounds clear to auscultation       Cardiovascular hypertension, Pt. on medications  Rhythm:Regular Rate:Normal     Neuro/Psych Depression TIA   GI/Hepatic Neg liver ROS, GERD  ,  Endo/Other  Hypothyroidism   Renal/GU negative Renal ROS     Musculoskeletal  (+) Arthritis ,   Abdominal   Peds  Hematology negative hematology ROS (+)   Anesthesia Other Findings   Reproductive/Obstetrics                            Lab Results  Component Value Date   WBC 8.6 11/26/2015   HGB 16.8 11/26/2015   HCT 50.3 11/26/2015   MCV 92.3 11/26/2015   PLT 276 11/26/2015   Lab Results  Component Value Date   CREATININE 1.12 11/26/2015   BUN 20 11/26/2015   NA 136 11/26/2015   K 3.8 11/26/2015   CL 104 11/26/2015   CO2 21 (L) 11/26/2015    Anesthesia Physical Anesthesia Plan  ASA: II  Anesthesia Plan: General and Regional   Post-op Pain Management:  Regional for Post-op pain   Induction: Intravenous  Airway Management Planned: LMA  Additional Equipment:   Intra-op Plan:   Post-operative Plan: Extubation in OR  Informed Consent: I have reviewed the patients History and Physical, chart, labs and discussed the procedure including the risks, benefits and alternatives for the proposed anesthesia with the patient or authorized representative who has indicated his/her understanding and acceptance.   Dental advisory given  Plan Discussed with: CRNA  Anesthesia Plan Comments:        Anesthesia Quick Evaluation

## 2015-12-06 NOTE — Progress Notes (Signed)
Report given to lauren reynolds rn as caregiver 

## 2015-12-06 NOTE — Anesthesia Procedure Notes (Addendum)
Procedure Name: LMA Insertion Date/Time: 12/06/2015 11:19 AM Performed by: Luciana Axe K Pre-anesthesia Checklist: Patient identified Patient Re-evaluated:Patient Re-evaluated prior to inductionOxygen Delivery Method: Circle system utilized Preoxygenation: Pre-oxygenation with 100% oxygen Intubation Type: IV induction Ventilation: Mask ventilation without difficulty LMA: LMA inserted LMA Size: 4.0 Placement Confirmation: positive ETCO2 and breath sounds checked- equal and bilateral Tube secured with: Tape Dental Injury: Teeth and Oropharynx as per pre-operative assessment

## 2015-12-06 NOTE — Progress Notes (Signed)
Orthopedic Tech Progress Note Patient Details:  [name] 16-May-1950 [id number]  Patient ID: [name], male   DOB: 25-Oct-1950, 65 y.o.   MRN: [id number] Applied ohf to bed  Karolee Stamps 12/06/2015, 8:20 PM

## 2015-12-06 NOTE — Anesthesia Procedure Notes (Signed)
Anesthesia Regional Block:  Adductor canal block  Pre-Anesthetic Checklist: ,, timeout performed, Correct Patient, Correct Site, Correct Laterality, Correct Procedure, Correct Position, site marked, Risks and benefits discussed,  Surgical consent,  Pre-op evaluation,  At surgeon's request and post-op pain management  Laterality: Right  Prep: chloraprep       Needles:  Injection technique: Single-shot  Needle Type: Echogenic Needle     Needle Length: 9cm 9 cm Needle Gauge: 21 and 21 G    Additional Needles:  Procedures: ultrasound guided (picture in chart) Adductor canal block Narrative:  Start time: 12/06/2015 10:15 AM End time: 12/06/2015 10:22 AM Injection made incrementally with aspirations every 5 mL.  Performed by: Personally  Anesthesiologist: Suzette Battiest

## 2015-12-06 NOTE — Transfer of Care (Signed)
Immediate Anesthesia Transfer of Care Note  Patient: [name]  Procedure(s) Performed: Procedure(s): TOTAL KNEE ARTHROPLASTY (Right)  Patient Location: PACU  Anesthesia Type:General  Level of Consciousness: awake, oriented and patient cooperative  Airway & Oxygen Therapy: Patient Spontanous Breathing and Patient connected to face mask oxygen  Post-op Assessment: Report given to RN and Post -op Vital signs reviewed and stable  Post vital signs: Reviewed  Last Vitals:  Vitals:   12/06/15 1030 12/06/15 1035  BP: 137/77 126/78  Pulse: 62 62  Resp: 14 16  Temp:      Last Pain:  Vitals:   12/06/15 1035  PainSc: 0-No pain      Patients Stated Pain Goal: 5 ([id number])  Complications: No apparent anesthesia complications

## 2015-12-06 NOTE — Anesthesia Postprocedure Evaluation (Signed)
Anesthesia Post Note  Patient: [name]  Procedure(s) Performed: Procedure(s) (LRB): TOTAL KNEE ARTHROPLASTY (Right)  Patient location during evaluation: PACU Anesthesia Type: General and Regional Level of consciousness: awake and alert Pain management: pain level controlled Vital Signs Assessment: post-procedure vital signs reviewed and stable Respiratory status: spontaneous breathing, nonlabored ventilation, respiratory function stable and patient connected to nasal cannula oxygen Cardiovascular status: blood pressure returned to baseline and stable Postop Assessment: no signs of nausea or vomiting Anesthetic complications: no    Last Vitals:  Vitals:   12/06/15 1430 12/06/15 1503  BP: 134/80 (!) 139/96  Pulse: 75 69  Resp: 15   Temp: 37 C 37.1 C    Last Pain:  Vitals:   12/06/15 1503  TempSrc: Oral  PainSc:                  [name]

## 2015-12-06 NOTE — Progress Notes (Signed)
Orthopedic Tech Progress Note Patient Details:  [name] 02/04/1951 [id number]  CPM Right Knee CPM Right Knee: On Right Knee Flexion (Degrees): 90 Right Knee Extension (Degrees): 0 Additional Comments: Trapeze bar and foot roll   [name] 12/06/2015, 1:34 PM

## 2015-12-07 ENCOUNTER — Encounter (HOSPITAL_COMMUNITY): Payer: Self-pay | Admitting: Orthopedic Surgery

## 2015-12-07 LAB — CBC
HEMATOCRIT: 42.6 % (ref 39.0–52.0)
HEMOGLOBIN: 13.8 g/dL (ref 13.0–17.0)
MCH: 30.6 pg (ref 26.0–34.0)
MCHC: 32.4 g/dL (ref 30.0–36.0)
MCV: 94.5 fL (ref 78.0–100.0)
Platelets: 272 10*3/uL (ref 150–400)
RBC: 4.51 MIL/uL (ref 4.22–5.81)
RDW: 14.1 % (ref 11.5–15.5)
WBC: 16.2 10*3/uL — AB (ref 4.0–10.5)

## 2015-12-07 LAB — BASIC METABOLIC PANEL
ANION GAP: 5 (ref 5–15)
BUN: 14 mg/dL (ref 6–20)
CALCIUM: 8.9 mg/dL (ref 8.9–10.3)
CO2: 28 mmol/L (ref 22–32)
CREATININE: 0.98 mg/dL (ref 0.61–1.24)
Chloride: 102 mmol/L (ref 101–111)
GFR calc non Af Amer: >60 mL/min (ref >60)
Glucose, Bld: 162 mg/dL — ABNORMAL HIGH (ref 65–99)
Potassium: 3.9 mmol/L (ref 3.5–5.1)
SODIUM: 135 mmol/L (ref 135–145)

## 2015-12-07 MED ORDER — ASPIRIN 325 MG PO TBEC: 325.0000 mg | DELAYED_RELEASE_TABLET | Freq: Two times a day (BID) | ORAL | 0 refills | Status: DC

## 2015-12-07 MED ORDER — METHOCARBAMOL 500 MG PO TABS: 500.0000 mg | ORAL_TABLET | Freq: Four times a day (QID) | ORAL | 0 refills | Status: DC | PRN

## 2015-12-07 MED ORDER — OXYCODONE HCL 5 MG PO TABS
5.0000 mg | ORAL_TABLET | ORAL | 0 refills | Status: DC | PRN
Start: 2015-12-07 — End: 2016-06-30

## 2015-12-07 MED ORDER — ONDANSETRON HCL 4 MG PO TABS: 4.0000 mg | ORAL_TABLET | Freq: Four times a day (QID) | ORAL | 0 refills | Status: DC | PRN

## 2015-12-07 NOTE — Care Management Note (Signed)
Case Management Note  Patient Details  Name: [name] MRN: [id number] Date of Birth: 04/20/1950  Subjective/Objective:   65 yr old male s/p right total knee arthroplasty.          Action/Plan: Case manager spoke with patient concerning discharge plan. Patient will go home with his friend. Preoperatively setup with [facility], no changes. Patient will not use CPM, refuses 3in1. RW will be delivered to his room.    Expected Discharge Date:   12/07/15               Expected Discharge Plan:  [place]  In-House Referral:     Discharge planning Services  CM Consult  Post Acute Care Choice:  Durable Medical Equipment, Home Health Choice offered to:  Patient  DME Arranged:  [place] rolling DME Agency:  [place]:  PT [place]:  [facility] (now [facility])  Status of Service:  Completed, signed off  If discussed at [place] of Stay Meetings, dates discussed:    Additional Comments:  Ninfa Meeker, RN 12/07/2015, 3:56 PM

## 2015-12-07 NOTE — Progress Notes (Signed)
SPORTS MEDICINE AND JOINT REPLACEMENT  Lara Mulch, MD    Carlyon Shadow, PA-C [address], [place], [place]  [address]                             [phone]   PROGRESS NOTE  Subjective:  negative for Chest Pain  negative for Shortness of Breath  negative for Nausea/Vomiting   negative for Calf Pain  negative for Bowel Movement   Tolerating Diet: yes         Patient reports pain as 4 on 0-10 scale.    Objective: Vital signs in last 24 hours:   Patient Vitals for the past 24 hrs:  BP Temp Temp src Pulse Resp SpO2 Weight  12/07/15 0558 (!) 124/97 97.9 F (36.6 C) Oral 66 16 99 % -  12/06/15 2043 (!) 153/71 97.8 F (36.6 C) Oral 69 15 97 % -  12/06/15 2024 135/78 97.7 F (36.5 C) Oral 82 15 96 % -  12/06/15 1855 132/90 98.3 F (36.8 C) Oral 82 15 94 % -  12/06/15 1800 127/84 98 F (36.7 C) Oral 76 14 97 % -  12/06/15 1655 (!) 152/91 98.9 F (37.2 C) Oral 78 15 97 % -  12/06/15 1503 (!) 139/96 98.7 F (37.1 C) Oral 69 - 98 % -  12/06/15 1430 134/80 98.6 F (37 C) - 75 15 96 % -  12/06/15 1415 136/86 - - 71 12 96 % -  12/06/15 1400 (!) 143/89 - - 73 16 98 % -  12/06/15 1345 (!) 143/98 - - 68 12 99 % -  12/06/15 1330 (!) 154/83 - - 69 16 100 % -  12/06/15 1315 (!) 148/90 - - 78 13 100 % -  12/06/15 1300 (!) 154/96 97.9 F (36.6 C) - 87 (!) 24 100 % -  12/06/15 1035 126/78 - - 62 16 99 % -  12/06/15 1030 137/77 - - 62 14 98 % -  12/06/15 1025 (!) 149/80 - - 67 15 98 % -  12/06/15 1021 - - - 68 12 98 % -  12/06/15 0840 (!) 156/87 98.2 F (36.8 C) - 77 20 98 % 106.1 kg (234 lb)    @flow {1959:LAST@   Intake/Output from previous day:   10/02 0701 - 10/03 0700 In: 1000 [I.V.:1000] Out: 1500 [Urine:1450]   Intake/Output this shift:   No intake/output data recorded.   Intake/Output      10/02 0701 - 10/03 0700 10/03 0701 - 10/04 0700   I.V. (mL/kg) 1000 (9.4)    Total Intake(mL/kg) 1000 (9.4)    Urine (mL/kg/hr) 1450    Blood 50    Total Output 1500      Net -500             LABORATORY DATA: No results for input(s): WBC, HGB, HCT, PLT in the last 168 hours. No results for input(s): NA, K, CL, CO2, BUN, CREATININE, GLUCOSE, CALCIUM in the last 168 hours. Lab Results  Component Value Date   INR 0.90 12/10/2012   INR 2.6 12/22/2010   INR 2.4 11/23/2010    Examination:  General appearance: alert, cooperative and no distress Extremities: extremities normal, atraumatic, no cyanosis or edema  Wound Exam: clean, dry, intact   Drainage:  None: wound tissue dry  Motor Exam: Quadriceps and Hamstrings Intact  Sensory Exam: Superficial Peroneal, Deep Peroneal and Tibial normal   Assessment:    1  Day Post-Op  Procedure(s) (LRB): TOTAL KNEE ARTHROPLASTY (Right)  ADDITIONAL DIAGNOSIS:  Active Problems:   S/P total knee replacement     Plan: Physical Therapy as ordered Weight Bearing as Tolerated (WBAT)  DVT Prophylaxis:  Aspirin  DISCHARGE PLAN: Home  DISCHARGE NEEDS: HHPT   Patient doing well, probable D/C today with completion of PT         Donia Ast 12/07/2015, 7:01 AM

## 2015-12-07 NOTE — Discharge Summary (Signed)
SPORTS MEDICINE & JOINT REPLACEMENT   Lara Mulch, MD   Carlyon Shadow, PA-C [address], [place], [place]  [address]                             [phone]  PATIENT ID: [name]        MRN:  [id number]          DOB/AGE: Jun 05, 1950 / 65 y.o.    DISCHARGE SUMMARY  ADMISSION DATE:    12/06/2015 DISCHARGE DATE:   12/07/2015   ADMISSION DIAGNOSIS: primary osteoarthritis right knee    DISCHARGE DIAGNOSIS:  primary osteoarthritis right knee    ADDITIONAL DIAGNOSIS: Active Problems:   S/P total knee replacement  Past Medical History:  Diagnosis Date  . Allergy   . Chronic back pain    DDD  . Depression    takes Paxil daily  . GERD (gastroesophageal reflux disease)    takes Protonix daily  . History of bronchitis    many yrs ago  . History of kidney stones   . Hx of colonic polyps   . Hyperlipidemia    takes Lipitor daily  . Hypertension    takes Lisinopril-HCTZ daily  . Hypothyroidism    takes Synthroid daily  . Insomnia 12-10-12   not a problem now  . Joint pain   . Nocturia   . Osteoarthritis   . Pneumonia 2012   hx of   . Prostate cancer ([place])   . TIA (transient ischemic attack)    takes ASA daily    PROCEDURE: Procedure(s): TOTAL KNEE ARTHROPLASTY on 12/06/2015  CONSULTS:    HISTORY:  See H&P in chart  HOSPITAL COURSE:  [name] is a 65 y.o. admitted on 12/06/2015 and found to have a diagnosis of primary osteoarthritis right knee.  After appropriate laboratory studies were obtained  they were taken to the operating room on 12/06/2015 and underwent Procedure(s): TOTAL KNEE ARTHROPLASTY.   They were given perioperative antibiotics:  Anti-infectives    Start     Dose/Rate Route Frequency Ordered Stop   12/06/15 1730  ceFAZolin (ANCEF) IVPB 1 g/50 mL premix     1 g 100 mL/hr over 30 Minutes Intravenous Every 6 hours 12/06/15 1612 12/07/15 0536   12/06/15 0923  ceFAZolin (ANCEF) IVPB 2g/100 mL premix     2 g 200 mL/hr over 30  Minutes Intravenous On call to O.R. 12/06/15 [id number] 12/06/15 1115    .  Patient given tranexamic acid IV or topical and exparel intra-operatively.  Tolerated the procedure well.    POD# 1: Vital signs were stable.  Patient denied Chest pain, shortness of breath, or calf pain.  Patient was started on Lovenox 30 mg subcutaneously twice daily at 8am.  Consults to PT, OT, and care management were made.  The patient was weight bearing as tolerated.  CPM was placed on the operative leg 0-90 degrees for 6-8 hours a day. When out of the CPM, patient was placed in the foam block to achieve full extension. Incentive spirometry was taught.  Dressing was changed.       POD #2, Continued  PT for ambulation and exercise program.  IV saline locked.  O2 discontinued.    The remainder of the hospital course was dedicated to ambulation and strengthening.   The patient was discharged on 1 Day Post-Op in  Good condition.  Blood products given:none  DIAGNOSTIC STUDIES: Recent vital signs:  Patient Vitals for the past 24 hrs:  BP Temp Temp src Pulse Resp SpO2  12/07/15 1059 100/64 - - - - -  12/07/15 0558 (!) 124/97 97.9 F (36.6 C) Oral 66 16 99 %  12/06/15 2043 (!) 153/71 97.8 F (36.6 C) Oral 69 15 97 %  12/06/15 2024 135/78 97.7 F (36.5 C) Oral 82 15 96 %  12/06/15 1855 132/90 98.3 F (36.8 C) Oral 82 15 94 %  12/06/15 1800 127/84 98 F (36.7 C) Oral 76 14 97 %  12/06/15 1655 (!) 152/91 98.9 F (37.2 C) Oral 78 15 97 %       Recent laboratory studies:  Recent Labs  12/07/15 0632  WBC 16.2*  HGB 13.8  HCT 42.6  PLT 272    Recent Labs  12/07/15 0632  NA 135  K 3.9  CL 102  CO2 28  BUN 14  CREATININE 0.98  GLUCOSE 162*  CALCIUM 8.9   Lab Results  Component Value Date   INR 0.90 12/10/2012   INR 2.6 12/22/2010   INR 2.4 11/23/2010     Recent Radiographic Studies :  Dg Chest 2 View  Result Date: 11/26/2015 CLINICAL DATA:  Preoperative examination. Patient for total knee  replacement. EXAM: CHEST  2 VIEW COMPARISON:  PA and lateral chest 12/10/2012.  CT chest 05/30/2010. FINDINGS: The lungs are clear. Heart size is normal. No pneumothorax or pleural effusion. No focal bony abnormality. IMPRESSION: No acute disease. Electronically Signed   By: Inge Rise M.D.   On: 11/26/2015 14:29    DISCHARGE INSTRUCTIONS: Discharge Instructions    CPM    Complete by:  As directed    Continuous passive motion machine (CPM):      Use the CPM from 0 to 90 for 4-6 hours per day.      You may increase by 10 per day.  You may break it up into 2 or 3 sessions per day.      Use CPM for 2 weeks or until you are told to stop.   Call MD / Call 911    Complete by:  As directed    If you experience chest pain or shortness of breath, CALL 911 and be transported to the hospital emergency room.  If you develope a fever above 101 F, pus (white drainage) or increased drainage or redness at the wound, or calf pain, call your surgeon's office.   Constipation Prevention    Complete by:  As directed    Drink plenty of fluids.  Prune juice may be helpful.  You may use a stool softener, such as Colace (over the counter) 100 mg twice a day.  Use MiraLax (over the counter) for constipation as needed.   Diet - low sodium heart healthy    Complete by:  As directed    Discharge instructions    Complete by:  As directed    INSTRUCTIONS AFTER JOINT REPLACEMENT   Remove items at home which could result in a fall. This includes throw rugs or furniture in walking pathways ICE to the affected joint every three hours while awake for 30 minutes at a time, for at least the first 3-5 days, and then as needed for pain and swelling.  Continue to use ice for pain and swelling. You may notice swelling that will progress down to the foot and ankle.  This is normal after surgery.  Elevate your leg when you are not up walking on it.  Continue to use the breathing machine you got in the hospital (incentive  spirometer) which will help keep your temperature down.  It is common for your temperature to cycle up and down following surgery, especially at night when you are not up moving around and exerting yourself.  The breathing machine keeps your lungs expanded and your temperature down.   DIET:  As you were doing prior to hospitalization, we recommend a well-balanced diet.  DRESSING / WOUND CARE / SHOWERING  Keep the surgical dressing until follow up.  The dressing is water proof, so you can shower without any extra covering.  IF THE DRESSING FALLS OFF or the wound gets wet inside, change the dressing with sterile gauze.  Please use good hand washing techniques before changing the dressing.  Do not use any lotions or creams on the incision until instructed by your surgeon.    ACTIVITY  Increase activity slowly as tolerated, but follow the weight bearing instructions below.   No driving for 6 weeks or until further direction given by your physician.  You cannot drive while taking narcotics.  No lifting or carrying greater than 10 lbs. until further directed by your surgeon. Avoid periods of inactivity such as sitting longer than an hour when not asleep. This helps prevent blood clots.  You may return to work once you are authorized by your doctor.     WEIGHT BEARING   Weight bearing as tolerated with assist device (walker, cane, etc) as directed, use it as long as suggested by your surgeon or therapist, typically at least 4-6 weeks.   EXERCISES  Results after joint replacement surgery are often greatly improved when you follow the exercise, range of motion and muscle strengthening exercises prescribed by your doctor. Safety measures are also important to protect the joint from further injury. Any time any of these exercises cause you to have increased pain or swelling, decrease what you are doing until you are comfortable again and then slowly increase them. If you have problems or questions,  call your caregiver or physical therapist for advice.   Rehabilitation is important following a joint replacement. After just a few days of immobilization, the muscles of the leg can become weakened and shrink (atrophy).  These exercises are designed to build up the tone and strength of the thigh and leg muscles and to improve motion. Often times heat used for twenty to thirty minutes before working out will loosen up your tissues and help with improving the range of motion but do not use heat for the first two weeks following surgery (sometimes heat can increase post-operative swelling).   These exercises can be done on a training (exercise) mat, on the floor, on a table or on a bed. Use whatever works the best and is most comfortable for you.    Use music or television while you are exercising so that the exercises are a pleasant break in your day. This will make your life better with the exercises acting as a break in your routine that you can look forward to.   Perform all exercises about fifteen times, three times per day or as directed.  You should exercise both the operative leg and the other leg as well.   Exercises include:   Quad Sets - Tighten up the muscle on the front of the thigh (Quad) and hold for 5-10 seconds.   Straight Leg Raises - With your knee straight (if you were given a brace, keep it on), lift  the leg to 60 degrees, hold for 3 seconds, and slowly lower the leg.  Perform this exercise against resistance later as your leg gets stronger.  Leg Slides: Lying on your back, slowly slide your foot toward your buttocks, bending your knee up off the floor (only go as far as is comfortable). Then slowly slide your foot back down until your leg is flat on the floor again.  Angel Wings: Lying on your back spread your legs to the side as far apart as you can without causing discomfort.  Hamstring Strength:  Lying on your back, push your heel against the floor with your leg straight by  tightening up the muscles of your buttocks.  Repeat, but this time bend your knee to a comfortable angle, and push your heel against the floor.  You may put a pillow under the heel to make it more comfortable if necessary.   A rehabilitation program following joint replacement surgery can speed recovery and prevent re-injury in the future due to weakened muscles. Contact your doctor or a physical therapist for more information on knee rehabilitation.    CONSTIPATION  Constipation is defined medically as fewer than three stools per week and severe constipation as less than one stool per week.  Even if you have a regular bowel pattern at home, your normal regimen is likely to be disrupted due to multiple reasons following surgery.  Combination of anesthesia, postoperative narcotics, change in appetite and fluid intake all can affect your bowels.   YOU MUST use at least one of the following options; they are listed in order of increasing strength to get the job done.  They are all available over the counter, and you may need to use some, POSSIBLY even all of these options:    Drink plenty of fluids (prune juice may be helpful) and high fiber foods Colace 100 mg by mouth twice a day  Senokot for constipation as directed and as needed Dulcolax (bisacodyl), take with full glass of water  Miralax (polyethylene glycol) once or twice a day as needed.  If you have tried all these things and are unable to have a bowel movement in the first 3-4 days after surgery call either your surgeon or your primary doctor.    If you experience loose stools or diarrhea, hold the medications until you stool forms back up.  If your symptoms do not get better within 1 week or if they get worse, check with your doctor.  If you experience "the worst abdominal pain ever" or develop nausea or vomiting, please contact the office immediately for further recommendations for treatment.   ITCHING:  If you experience itching with  your medications, try taking only a single pain pill, or even half a pain pill at a time.  You can also use Benadryl over the counter for itching or also to help with sleep.   TED HOSE STOCKINGS:  Use stockings on both legs until for at least 2 weeks or as directed by physician office. They may be removed at night for sleeping.  MEDICATIONS:  See your medication summary on the "After Visit Summary" that nursing will review with you.  You may have some home medications which will be placed on hold until you complete the course of blood thinner medication.  It is important for you to complete the blood thinner medication as prescribed.  PRECAUTIONS:  If you experience chest pain or shortness of breath - call 911 immediately for transfer to the hospital  emergency department.   If you develop a fever greater that 101 F, purulent drainage from wound, increased redness or drainage from wound, foul odor from the wound/dressing, or calf pain - CONTACT YOUR SURGEON.                                                   FOLLOW-UP APPOINTMENTS:  If you do not already have a post-op appointment, please call the office for an appointment to be seen by your surgeon.  Guidelines for how soon to be seen are listed in your "After Visit Summary", but are typically between 1-4 weeks after surgery.  OTHER INSTRUCTIONS:   Knee Replacement:  Do not place pillow under knee, focus on keeping the knee straight while resting. CPM instructions: 0-90 degrees, 2 hours in the morning, 2 hours in the afternoon, and 2 hours in the evening. Place foam block, curve side up under heel at all times except when in CPM or when walking.  DO NOT modify, tear, cut, or change the foam block in any way.  MAKE SURE YOU:  Understand these instructions.  Get help right away if you are not doing well or get worse.    Thank you for letting us be a part of your medical care team.  It is a privilege we respect greatly.  We hope these instructions  will help you stay on track for a fast and full recovery!   Increase activity slowly as tolerated    Complete by:  As directed       DISCHARGE MEDICATIONS:     Medication List    TAKE these medications   aspirin 325 MG EC tablet Take 1 tablet (325 mg total) by mouth 2 (two) times daily. What changed:  medication strength  how much to take  when to take this   atorvastatin 40 MG tablet Commonly known as:  LIPITOR TAKE 1 TABLET BY MOUTH DAILY   levothyroxine 88 MCG tablet Commonly known as:  SYNTHROID, LEVOTHROID TAKE 1 TABLET BY MOUTH DAILY   lisinopril-hydrochlorothiazide 20-12.5 MG tablet Commonly known as:  PRINZIDE,ZESTORETIC TAKE 1 TABLET BY MOUTH DAILY   methocarbamol 500 MG tablet Commonly known as:  ROBAXIN Take 1-2 tablets (500-1,000 mg total) by mouth every 6 (six) hours as needed for muscle spasms.   multivitamin tablet Take 1 tablet by mouth daily.   ondansetron 4 MG tablet Commonly known as:  ZOFRAN Take 1 tablet (4 mg total) by mouth every 6 (six) hours as needed for nausea.   oxyCODONE 5 MG immediate release tablet Commonly known as:  Oxy IR/ROXICODONE Take 1-2 tablets (5-10 mg total) by mouth every 3 (three) hours as needed for breakthrough pain.   pantoprazole 40 MG tablet Commonly known as:  PROTONIX Take 1 tablet (40 mg total) by mouth daily.   PARoxetine 20 MG tablet Commonly known as:  PAXIL TAKE 1 TABLET BY MOUTH DAILY IN THE EVENING   tadalafil 20 MG tablet Commonly known as:  CIALIS Take 1 tablet (20 mg total) by mouth daily as needed for erectile dysfunction.   traMADol 50 MG tablet Commonly known as:  ULTRAM Take 1 tablet (50 mg total) by mouth every 6 (six) hours as needed.       FOLLOW UP VISIT:    DISPOSITION: HOME VS. SNF  CONDITION:  Good  Donia Ast 12/07/2015, 3:05 PM

## 2015-12-07 NOTE — Op Note (Signed)
TOTAL KNEE REPLACEMENT OPERATIVE NOTE:  12/06/2015  4:03 PM  PATIENT:  [name]  65 y.o. male  PRE-OPERATIVE DIAGNOSIS:  primary osteoarthritis right knee  POST-OPERATIVE DIAGNOSIS:  primary osteoarthritis right knee  PROCEDURE:  Procedure(s): TOTAL KNEE ARTHROPLASTY  SURGEON:  Surgeon(s): Vickey Huger, MD  PHYSICIAN ASSISTANT: Carlyon Shadow, [facility]   ANESTHESIA:   spinal  DRAINS: Hemovac  SPECIMEN: None  COUNTS:  Correct  TOURNIQUET:   Total Tourniquet Time Documented: Thigh (Right) - 43 minutes Total: Thigh (Right) - 43 minutes   DICTATION:  Indication for procedure:    The patient is a 65 y.o. male who has failed conservative treatment for primary osteoarthritis right knee.  Informed consent was obtained prior to anesthesia. The risks versus benefits of the operation were explain and in a way the patient can, and did, understand.   On the implant demand matching protocol, this patient scored 10.  Therefore, this patient was not receive a polyethylene insert with vitamin E which is a high demand implant.  Description of procedure:     The patient was taken to the operating room and placed under anesthesia.  The patient was positioned in the usual fashion taking care that all body parts were adequately padded and/or protected.  I foley catheter was not placed.  A tourniquet was applied and the leg prepped and draped in the usual sterile fashion.  The extremity was exsanguinated with the esmarch and tourniquet inflated to 350 mmHg.  Pre-operative range of motion was normal.  The knee was in 5 degree of mild varus.  A midline incision approximately 6-7 inches long was made with a #10 blade.  A new blade was used to make a parapatellar arthrotomy going 2-3 cm into the quadriceps tendon, over the patella, and alongside the medial aspect of the patellar tendon.  A synovectomy was then performed with the #10 blade and forceps. I then elevated the deep MCL off the medial  tibial metaphysis subperiosteally around to the semimembranosus attachment.    I everted the patella and used calipers to measure patellar thickness.  I used the reamer to ream down to appropriate thickness to recreate the native thickness.  I then removed excess bone with the rongeur and sagittal saw.  I used the appropriately sized template and drilled the three lug holes.  I then put the trial in place and measured the thickness with the calipers to ensure recreation of the native thickness.  The trial was then removed and the patella subluxed and the knee brought into flexion.  A homan retractor was place to retract and protect the patella and lateral structures.  A Z-retractor was place medially to protect the medial structures.  The extra-medullary alignment system was used to make cut the tibial articular surface perpendicular to the anamotic axis of the tibia and in 3 degrees of posterior slope.  The cut surface and alignment jig was removed.  I then used the intramedullary alignment guide to make a 6 valgus cut on the distal femur.  I then marked out the epicondylar axis on the distal femur.  The posterior condylar axis measured 3 degrees.  I then used the anterior referencing sizer and measured the femur to be a size 10.  The 4-In-1 cutting block was screwed into place in external rotation matching the posterior condylar angle, making our cuts perpendicular to the epicondylar axis.  Anterior, posterior and chamfer cuts were made with the sagittal saw.  The cutting block and cut pieces  were removed.  A lamina spreader was placed in 90 degrees of flexion.  The ACL, PCL, menisci, and posterior condylar osteophytes were removed.  A 13 mm spacer blocked was found to offer good flexion and extension gap balance after moderate in degree releasing.   The scoop retractor was then placed and the femoral finishing block was pinned in place.  The small sagittal saw was used as well as the lug drill to finish  the femur.  The block and cut surfaces were removed and the medullary canal hole filled with autograft bone from the cut pieces.  The tibia was delivered forward in deep flexion and external rotation.  A size G tray was selected and pinned into place centered on the medial 1/3 of the tibial tubercle.  The reamer and keel was used to prepare the tibia through the tray.    I then trialed with the size 10 femur, size G tibia, a 13 mm insert and the 32 patella.  I had excellent flexion/extension gap balance, excellent patella tracking.  Flexion was full and beyond 120 degrees; extension was zero.  These components were chosen and the staff opened them to me on the back table while the knee was lavaged copiously and the cement mixed.  The soft tissue was infiltrated with 60cc of exparel 1.3% through a 21 gauge needle.  I cemented in the components and removed all excess cement.  The polyethylene tibial component was snapped into place and the knee placed in extension while cement was hardening.  The capsule was infilltrated with 30cc of .25% Marcaine with epinephrine.  A hemovac was place in the joint exiting superolaterally.  A pain pump was place superomedially superficial to the arthrotomy.  Once the cement was hard, the tourniquet was let down.  Hemostasis was obtained.  The arthrotomy was closed with figure-8 #1 vicryl sutures.  The deep soft tissues were closed with #0 vicryls and the subcuticular layer closed with a running #2-0 vicryl.  The skin was reapproximated and closed with skin staples.  The wound was dressed with xeroform, 4 x4's, 2 ABD sponges, a single layer of webril and a TED stocking.   The patient was then awakened, extubated, and taken to the recovery room in stable condition.  BLOOD LOSS:  300cc DRAINS: 1 hemovac, 1 pain catheter COMPLICATIONS:  None.  PLAN OF CARE: Admit to inpatient   PATIENT DISPOSITION:  PACU - hemodynamically stable.   Delay start of Pharmacological VTE  agent (>24hrs) due to surgical blood loss or risk of bleeding:  not applicable  Please fax a copy of this op note to my office at [phone] (please only include page 1 and 2 of the Case Information op note)

## 2015-12-07 NOTE — Evaluation (Signed)
Physical Therapy Evaluation Patient Details Name: [name] MRN: [id number] DOB: 01-14-1951 Today's Date: 12/07/2015   History of Present Illness  65 y.o. male s/p R TKA. PMH consists of sciatica, HTN, depression, DVT, and CVA.  Clinical Impression  PT eval complete. Pt demo mod I with bed mobility. Min guard assist provided for transfers and ambulation due to pt's first time OOB. No physical assist needed. Verbal cues required for ambulation for sequencing with RW. Pt ambulated 150 feet with RW. Stair training completed with min guard assist. Pt will have needed level of assist at home. Pt is safe to d/c home from a mobility standpoint. He is scheduled for d/c today. Recommend HHPT and RW for home. PT signing off.    Follow Up Recommendations Home health PT;Supervision for mobility/OOB    Equipment Recommendations  Rolling walker with 5" wheels    Recommendations for Other Services       Precautions / Restrictions Precautions Precautions: Knee Precaution Comments: Educated pt on no pillow under knee. Restrictions RLE Weight Bearing: Weight bearing as tolerated      Mobility  Bed Mobility Overal bed mobility: Modified Independent             General bed mobility comments: HOB elevated, +rail  Transfers Overall transfer level: Needs assistance Equipment used: Rolling walker (2 wheeled) Transfers: Sit to/from [facility] Sit to Stand: Min guard Stand pivot transfers: Min guard       General transfer comment: min guard for safety only due to first time OOB. No physical assist needed. No LOB noted.  Ambulation/Gait Ambulation/Gait assistance: Min guard Ambulation Distance (Feet): 150 Feet Assistive device: Rolling walker (2 wheeled) Gait Pattern/deviations: Step-to pattern;Antalgic;Decreased stride length Gait velocity: decreased Gait velocity interpretation: Below normal speed for age/gender General Gait Details: verbal cues for sequencing.  No physical assist needed.  Stairs Stairs: Yes Stairs assistance: Min guard Stair Management: One rail Left;Step to pattern;Sideways Number of Stairs: 5 General stair comments: verbal cues for sequencing  Wheelchair Mobility    Modified Rankin (Stroke Patients Only)       Balance                                             Pertinent Vitals/Pain Pain Assessment: 0-10 Pain Score: 6  Pain Location: R hip and knee Pain Descriptors / Indicators: Aching;Sore Pain Intervention(s): Monitored during session;Repositioned;Ice applied    Home Living Family/patient expects to be discharged to:: Private residence Living Arrangements: Spouse/significant other Available Help at Discharge: Family;Available 24 hours/day Type of Home: Apartment Home Access: Stairs to enter Entrance Stairs-Rails: Left Entrance Stairs-Number of Steps: 1 flight Home Layout: One level Home Equipment: None      Prior Function Level of Independence: Independent               Hand Dominance        Extremity/Trunk Assessment                         Communication   Communication: No difficulties  Cognition Arousal/Alertness: Awake/alert Behavior During Therapy: WFL for tasks assessed/performed Overall Cognitive Status: Within Functional Limits for tasks assessed                      General Comments      Exercises Total Joint Exercises Ankle  Circles/Pumps: AROM;Both;10 reps Quad Sets: AROM;Both;10 reps Towel Squeeze: AROM;Both;10 reps Heel Slides: AROM;Right;10 reps Hip ABduction/ADduction: AROM;Right;10 reps Goniometric ROM: 0-90 R knee   Assessment/Plan    PT Assessment All further PT needs can be met in the next venue of care  PT Problem List Decreased strength;Decreased range of motion;Decreased activity tolerance;Decreased balance;Decreased knowledge of precautions;Decreased mobility;Decreased knowledge of use of DME          PT  Treatment Interventions      PT Goals (Current goals can be found in the Care Plan section)  Acute Rehab PT Goals Patient Stated Goal: home today PT Goal Formulation: All assessment and education complete, DC therapy    Frequency     Barriers to discharge        Co-evaluation               End of Session Equipment Utilized During Treatment: Gait belt Activity Tolerance: Patient tolerated treatment well Patient left: in chair;with call bell/phone within reach Nurse Communication: Mobility status         Time: 1848-5927 PT Time Calculation (min) (ACUTE ONLY): 43 min   Charges:   PT Evaluation $PT Eval Moderate Complexity: 1 Procedure PT Treatments $Gait Training: 8-22 mins $Therapeutic Exercise: 8-22 mins   PT G Codes:        Lorriane Shire 12/07/2015, 10:47 AM

## 2015-12-07 NOTE — Progress Notes (Signed)
Orthopedic Tech Progress Note Patient Details:  [name] [birth year] [id number]  Patient ID: [name], male   DOB: 05-02-1950, 65 y.o.   MRN: [id number] Pt does not have a cpm. Pt stated he could not use one due to previous back injuries.   Karolee Stamps 12/07/2015, 6:08 AM

## 2015-12-07 NOTE — Evaluation (Addendum)
Occupational Therapy Evaluation/Discharge Patient Details Name: [name] MRN: [id number] DOB: 03/22/1950 Today's Date: 12/07/2015    History of Present Illness 65 y.o. male s/p R TKA. PMH consists of sciatica, HTN, depression, DVT, and CVA.   Clinical Impression   PTA, pt was independent with all ADL but utilized cane for community mobility at times. Pt admitted and underwent the above. Pt currently requires supervision for UB ADL, min guard assist for LB ADL and toilet transfer, and minimum assistance for tub transfer. Educated pt concerning dressing techniques, use of zero degree bone pillow, and no pillow under knee. Also educated on improved safety with tub transfer when utilizing shower seat or 3-in-1, but pt declining stating that he feels steady enough with his removable grab bars. Pt plans to D/C home today with 24 hour assistance/supervision from his girlfriend who is able to provide necessary assistance. No OT follow-up required post-acute D/C. No further acute OT needs. OT signing off.    Follow Up Recommendations  No OT follow up;Supervision/Assistance - 24 hour    Equipment Recommendations  Other (comment) (Pt refusing 3-in-1 or shower chair recommendation.)       Precautions / Restrictions Precautions Precautions: Knee Precaution Booklet Issued: No Precaution Comments: Educated pt on no pillow under knee and use of zero degree bone foam. Restrictions Weight Bearing Restrictions: Yes RLE Weight Bearing: Weight bearing as tolerated      Mobility Bed Mobility Overal bed mobility: Modified Independent             General bed mobility comments: HOB elevated, +rail  Transfers Overall transfer level: Needs assistance Equipment used: Rolling walker (2 wheeled) Transfers: Sit to/from Stand Sit to Stand: Min guard;Min assist Stand pivot transfers: Min guard       General transfer comment: Min guard for toilet and sit<>stand transfers; min assist for tub  transfer for safety and VC's for sequencing.    Balance Overall balance assessment: Needs assistance Sitting-balance support: Feet supported;No upper extremity supported Sitting balance-Leahy Scale: Good     Standing balance support: Single extremity supported;During functional activity Standing balance-Leahy Scale: Fair                              ADL Overall ADL's : Needs assistance/impaired Eating/Feeding: Supervision/ safety;Set up;Sitting   Grooming: Supervision/safety;Set up;Sitting   Upper Body Bathing: Supervision/ safety;Set up;Sitting   Lower Body Bathing: Min guard;Sit to/from stand   Upper Body Dressing : Supervision/safety;Set up;Sitting   Lower Body Dressing: Min guard;Sit to/from stand   Toilet Transfer: Min guard;Ambulation;RW;Regular Museum/gallery exhibitions officer and Hygiene: Min guard;Sit to/from stand   Tub/ Shower Transfer: Minimal assistance;Ambulation;Rolling walker Tub/Shower Transfer Details (indicate cue type and reason): Practiced with and without seat; pt refused 3-in-1 recommendation at this time. Functional mobility during ADLs: Min guard;Rolling walker;Cueing for safety General ADL Comments: Pt educated on benefits and improved safety when using 3-in-1 or shower seat during tub transfer. Pt reports that he has removable grab bars and does not need a seat. Pt also educated on ADLs with knee precautions, no pillow under knee, and use of zero degree bone foam.               Pertinent Vitals/Pain Pain Assessment: 0-10 Pain Score: 7  Pain Location: R knee Pain Descriptors / Indicators: Aching;Operative site guarding;Sore Pain Intervention(s): Monitored during session;Repositioned;Ice applied     Hand Dominance Right   Extremity/Trunk Assessment Upper Extremity  Assessment Upper Extremity Assessment: Overall WFL for tasks assessed   Lower Extremity Assessment Lower Extremity Assessment: RLE deficits/detail RLE  Deficits / Details: Decreased strength and ROM as expected post-operatively.       Communication Communication Communication: No difficulties   Cognition Arousal/Alertness: Awake/alert Behavior During Therapy: WFL for tasks assessed/performed Overall Cognitive Status: Within Functional Limits for tasks assessed                                Home Living Family/patient expects to be discharged to:: Private residence Living Arrangements: Spouse/significant other Available Help at Discharge: Family;Available 24 hours/day Type of Home: Apartment Home Access: Stairs to enter Entrance Stairs-Number of Steps: 1 flight Entrance Stairs-Rails: Left Home Layout: One level     Bathroom Shower/Tub: Tub/shower unit Shower/tub characteristics: Architectural technologist: Standard Bathroom Accessibility: Yes How Accessible: Accessible via walker Home Equipment: Kasandra Knudsen - single point          Prior Functioning/Environment Level of Independence: Independent                 OT Problem List: Decreased strength;Decreased range of motion;Decreased activity tolerance;Decreased knowledge of use of DME or AE;Decreased safety awareness;Pain;Decreased knowledge of precautions   OT Treatment/Interventions:      OT Goals(Current goals can be found in the care plan section) Acute Rehab OT Goals Patient Stated Goal: home today OT Goal Formulation: With patient Time For Goal Achievement: 12/21/15 Potential to Achieve Goals: Good   End of Session Equipment Utilized During Treatment: Gait belt;Rolling walker CPM Right Knee CPM Right Knee: Off  Activity Tolerance: Patient tolerated treatment well Patient left: in bed;with call bell/phone within reach   Time: 1316-1355 OT Time Calculation (min): 39 min Charges:  OT General Charges $OT Visit: 1 Procedure OT Evaluation $OT Eval Moderate Complexity: 1 Procedure OT Treatments $Self Care/Home Management : 23-37 mins  Norman Herrlich, OTR/L [phone] 12/07/2015, 2:29 PM

## 2015-12-07 NOTE — Progress Notes (Signed)
Discharge Note:  Patient alert and oriented X 4 and in no distress. Patient given discharge instructions regarding signs and symptoms to report, medications, diet, activity, and upcoming appointments.  He verbalized understanding of all instructions.  Peripheral IV discontinued. Patient awaiting equipment and ride.  He will be transported out via wheelchair by hospital staff.

## 2015-12-08 LAB — CBC
HEMATOCRIT: 40.4 % (ref 39.0–52.0)
HEMOGLOBIN: 13 g/dL (ref 13.0–17.0)
MCH: 30.7 pg (ref 26.0–34.0)
MCHC: 32.2 g/dL (ref 30.0–36.0)
MCV: 95.3 fL (ref 78.0–100.0)
Platelets: 266 10*3/uL (ref 150–400)
RBC: 4.24 MIL/uL (ref 4.22–5.81)
RDW: 14.3 % (ref 11.5–15.5)
WBC: 12.9 10*3/uL — AB (ref 4.0–10.5)

## 2015-12-09 DIAGNOSIS — Z471 Aftercare following joint replacement surgery: Secondary | ICD-10-CM | POA: Diagnosis not present

## 2015-12-09 DIAGNOSIS — Z96653 Presence of artificial knee joint, bilateral: Secondary | ICD-10-CM | POA: Diagnosis not present

## 2015-12-09 DIAGNOSIS — M5430 Sciatica, unspecified side: Secondary | ICD-10-CM | POA: Diagnosis not present

## 2015-12-09 DIAGNOSIS — I10 Essential (primary) hypertension: Secondary | ICD-10-CM | POA: Diagnosis not present

## 2015-12-09 DIAGNOSIS — F329 Major depressive disorder, single episode, unspecified: Secondary | ICD-10-CM | POA: Diagnosis not present

## 2015-12-09 DIAGNOSIS — M1990 Unspecified osteoarthritis, unspecified site: Secondary | ICD-10-CM | POA: Diagnosis not present

## 2015-12-10 DIAGNOSIS — M1990 Unspecified osteoarthritis, unspecified site: Secondary | ICD-10-CM | POA: Diagnosis not present

## 2015-12-10 DIAGNOSIS — M5430 Sciatica, unspecified side: Secondary | ICD-10-CM | POA: Diagnosis not present

## 2015-12-10 DIAGNOSIS — Z471 Aftercare following joint replacement surgery: Secondary | ICD-10-CM | POA: Diagnosis not present

## 2015-12-10 DIAGNOSIS — F329 Major depressive disorder, single episode, unspecified: Secondary | ICD-10-CM | POA: Diagnosis not present

## 2015-12-10 DIAGNOSIS — I10 Essential (primary) hypertension: Secondary | ICD-10-CM | POA: Diagnosis not present

## 2015-12-10 DIAGNOSIS — Z96653 Presence of artificial knee joint, bilateral: Secondary | ICD-10-CM | POA: Diagnosis not present

## 2015-12-11 DIAGNOSIS — F329 Major depressive disorder, single episode, unspecified: Secondary | ICD-10-CM | POA: Diagnosis not present

## 2015-12-11 DIAGNOSIS — M1990 Unspecified osteoarthritis, unspecified site: Secondary | ICD-10-CM | POA: Diagnosis not present

## 2015-12-11 DIAGNOSIS — Z471 Aftercare following joint replacement surgery: Secondary | ICD-10-CM | POA: Diagnosis not present

## 2015-12-11 DIAGNOSIS — M5430 Sciatica, unspecified side: Secondary | ICD-10-CM | POA: Diagnosis not present

## 2015-12-11 DIAGNOSIS — Z96653 Presence of artificial knee joint, bilateral: Secondary | ICD-10-CM | POA: Diagnosis not present

## 2015-12-11 DIAGNOSIS — I10 Essential (primary) hypertension: Secondary | ICD-10-CM | POA: Diagnosis not present

## 2015-12-14 DIAGNOSIS — Z471 Aftercare following joint replacement surgery: Secondary | ICD-10-CM | POA: Diagnosis not present

## 2015-12-14 DIAGNOSIS — M5430 Sciatica, unspecified side: Secondary | ICD-10-CM | POA: Diagnosis not present

## 2015-12-14 DIAGNOSIS — Z96653 Presence of artificial knee joint, bilateral: Secondary | ICD-10-CM | POA: Diagnosis not present

## 2015-12-14 DIAGNOSIS — M1990 Unspecified osteoarthritis, unspecified site: Secondary | ICD-10-CM | POA: Diagnosis not present

## 2015-12-14 DIAGNOSIS — F329 Major depressive disorder, single episode, unspecified: Secondary | ICD-10-CM | POA: Diagnosis not present

## 2015-12-14 DIAGNOSIS — I10 Essential (primary) hypertension: Secondary | ICD-10-CM | POA: Diagnosis not present

## 2015-12-15 DIAGNOSIS — Z471 Aftercare following joint replacement surgery: Secondary | ICD-10-CM | POA: Diagnosis not present

## 2015-12-15 DIAGNOSIS — I10 Essential (primary) hypertension: Secondary | ICD-10-CM | POA: Diagnosis not present

## 2015-12-15 DIAGNOSIS — F329 Major depressive disorder, single episode, unspecified: Secondary | ICD-10-CM | POA: Diagnosis not present

## 2015-12-15 DIAGNOSIS — Z96653 Presence of artificial knee joint, bilateral: Secondary | ICD-10-CM | POA: Diagnosis not present

## 2015-12-15 DIAGNOSIS — M5430 Sciatica, unspecified side: Secondary | ICD-10-CM | POA: Diagnosis not present

## 2015-12-15 DIAGNOSIS — M1990 Unspecified osteoarthritis, unspecified site: Secondary | ICD-10-CM | POA: Diagnosis not present

## 2015-12-17 DIAGNOSIS — M1990 Unspecified osteoarthritis, unspecified site: Secondary | ICD-10-CM | POA: Diagnosis not present

## 2015-12-17 DIAGNOSIS — Z96653 Presence of artificial knee joint, bilateral: Secondary | ICD-10-CM | POA: Diagnosis not present

## 2015-12-17 DIAGNOSIS — Z471 Aftercare following joint replacement surgery: Secondary | ICD-10-CM | POA: Diagnosis not present

## 2015-12-17 DIAGNOSIS — I10 Essential (primary) hypertension: Secondary | ICD-10-CM | POA: Diagnosis not present

## 2015-12-17 DIAGNOSIS — M5430 Sciatica, unspecified side: Secondary | ICD-10-CM | POA: Diagnosis not present

## 2015-12-17 DIAGNOSIS — F329 Major depressive disorder, single episode, unspecified: Secondary | ICD-10-CM | POA: Diagnosis not present

## 2015-12-20 DIAGNOSIS — I10 Essential (primary) hypertension: Secondary | ICD-10-CM | POA: Diagnosis not present

## 2015-12-20 DIAGNOSIS — Z471 Aftercare following joint replacement surgery: Secondary | ICD-10-CM | POA: Diagnosis not present

## 2015-12-20 DIAGNOSIS — M1990 Unspecified osteoarthritis, unspecified site: Secondary | ICD-10-CM | POA: Diagnosis not present

## 2015-12-20 DIAGNOSIS — F329 Major depressive disorder, single episode, unspecified: Secondary | ICD-10-CM | POA: Diagnosis not present

## 2015-12-20 DIAGNOSIS — M5430 Sciatica, unspecified side: Secondary | ICD-10-CM | POA: Diagnosis not present

## 2015-12-20 DIAGNOSIS — Z96653 Presence of artificial knee joint, bilateral: Secondary | ICD-10-CM | POA: Diagnosis not present

## 2016-01-02 ENCOUNTER — Encounter (HOSPITAL_COMMUNITY): Payer: Self-pay | Admitting: *Deleted

## 2016-01-02 ENCOUNTER — Emergency Department (HOSPITAL_COMMUNITY): Payer: Medicare Other

## 2016-01-02 ENCOUNTER — Emergency Department (HOSPITAL_COMMUNITY)
Admission: EM | Admit: 2016-01-02 | Discharge: 2016-01-02 | Disposition: A | Discharge status: Home / Self Care | Payer: Medicare Other | Attending: Emergency Medicine | Admitting: Emergency Medicine

## 2016-01-02 DIAGNOSIS — E039 Hypothyroidism, unspecified: Secondary | ICD-10-CM | POA: Insufficient documentation

## 2016-01-02 DIAGNOSIS — Z8673 Personal history of transient ischemic attack (TIA), and cerebral infarction without residual deficits: Secondary | ICD-10-CM | POA: Diagnosis not present

## 2016-01-02 DIAGNOSIS — Z7982 Long term (current) use of aspirin: Secondary | ICD-10-CM | POA: Diagnosis not present

## 2016-01-02 DIAGNOSIS — Z79899 Other long term (current) drug therapy: Secondary | ICD-10-CM | POA: Insufficient documentation

## 2016-01-02 DIAGNOSIS — R0789 Other chest pain: Secondary | ICD-10-CM | POA: Insufficient documentation

## 2016-01-02 DIAGNOSIS — R079 Chest pain, unspecified: Secondary | ICD-10-CM | POA: Diagnosis not present

## 2016-01-02 DIAGNOSIS — K5792 Diverticulitis of intestine, part unspecified, without perforation or abscess without bleeding: Secondary | ICD-10-CM | POA: Diagnosis not present

## 2016-01-02 DIAGNOSIS — K5732 Diverticulitis of large intestine without perforation or abscess without bleeding: Secondary | ICD-10-CM | POA: Diagnosis not present

## 2016-01-02 DIAGNOSIS — F17229 Nicotine dependence, chewing tobacco, with unspecified nicotine-induced disorders: Secondary | ICD-10-CM | POA: Insufficient documentation

## 2016-01-02 DIAGNOSIS — Z8546 Personal history of malignant neoplasm of prostate: Secondary | ICD-10-CM | POA: Diagnosis not present

## 2016-01-02 DIAGNOSIS — Z96651 Presence of right artificial knee joint: Secondary | ICD-10-CM | POA: Insufficient documentation

## 2016-01-02 DIAGNOSIS — R112 Nausea with vomiting, unspecified: Secondary | ICD-10-CM | POA: Diagnosis present

## 2016-01-02 DIAGNOSIS — I10 Essential (primary) hypertension: Secondary | ICD-10-CM | POA: Diagnosis not present

## 2016-01-02 DIAGNOSIS — R1030 Lower abdominal pain, unspecified: Secondary | ICD-10-CM | POA: Diagnosis not present

## 2016-01-02 DIAGNOSIS — R10819 Abdominal tenderness, unspecified site: Secondary | ICD-10-CM | POA: Insufficient documentation

## 2016-01-02 LAB — CBC WITH DIFFERENTIAL/PLATELET
BASOS PCT: 0 %
Basophils Absolute: 0 10*3/uL (ref 0.0–0.1)
EOS ABS: 0 10*3/uL (ref 0.0–0.7)
EOS PCT: 0 %
HCT: 44.5 % (ref 39.0–52.0)
HEMOGLOBIN: 15.3 g/dL (ref 13.0–17.0)
Lymphocytes Relative: 10 %
Lymphs Abs: 1 10*3/uL (ref 0.7–4.0)
MCH: 30.5 pg (ref 26.0–34.0)
MCHC: 34.4 g/dL (ref 30.0–36.0)
MCV: 88.8 fL (ref 78.0–100.0)
MONO ABS: 0.4 10*3/uL (ref 0.1–1.0)
MONOS PCT: 4 %
NEUTROS PCT: 86 %
Neutro Abs: 8.6 10*3/uL — ABNORMAL HIGH (ref 1.7–7.7)
PLATELETS: 337 10*3/uL (ref 150–400)
RBC: 5.01 MIL/uL (ref 4.22–5.81)
RDW: 14.1 % (ref 11.5–15.5)
WBC: 10 10*3/uL (ref 4.0–10.5)

## 2016-01-02 LAB — COMPREHENSIVE METABOLIC PANEL
ALT: 17 U/L (ref 17–63)
ANION GAP: 14 (ref 5–15)
AST: 22 U/L (ref 15–41)
Albumin: 4.4 g/dL (ref 3.5–5.0)
Alkaline Phosphatase: 113 U/L (ref 38–126)
BILIRUBIN TOTAL: 1.4 mg/dL — AB (ref 0.3–1.2)
BUN: 22 mg/dL — AB (ref 6–20)
CALCIUM: 9.7 mg/dL (ref 8.9–10.3)
CO2: 20 mmol/L — ABNORMAL LOW (ref 22–32)
Chloride: 100 mmol/L — ABNORMAL LOW (ref 101–111)
Creatinine, Ser: 1.12 mg/dL (ref 0.61–1.24)
GFR calc Af Amer: >60 mL/min (ref >60)
Glucose, Bld: 201 mg/dL — ABNORMAL HIGH (ref 65–99)
POTASSIUM: 4 mmol/L (ref 3.5–5.1)
Sodium: 134 mmol/L — ABNORMAL LOW (ref 135–145)
TOTAL PROTEIN: 7.3 g/dL (ref 6.5–8.1)

## 2016-01-02 LAB — LIPASE, BLOOD: LIPASE: 25 U/L (ref 11–51)

## 2016-01-02 LAB — TROPONIN I: Troponin I: <0.03 ng/mL (ref <0.03)

## 2016-01-02 MED ORDER — SODIUM CHLORIDE 0.9 % IV BOLUS (SEPSIS)
500.0000 mL | Freq: Once | INTRAVENOUS | Status: AC
  Administered 2016-01-02: 500 mL via INTRAVENOUS

## 2016-01-02 MED ORDER — ONDANSETRON 4 MG PO TBDP: 4.0000 mg | ORAL_TABLET | Freq: Three times a day (TID) | ORAL | 0 refills | Status: DC | PRN

## 2016-01-02 MED ORDER — ONDANSETRON HCL 4 MG/2ML IJ SOLN
4.0000 mg | Freq: Once | INTRAMUSCULAR | Status: AC
  Administered 2016-01-02: 4 mg via INTRAVENOUS
  Filled 2016-01-02: qty 2

## 2016-01-02 MED ORDER — IOPAMIDOL (ISOVUE-300) INJECTION 61%
INTRAVENOUS | Status: AC
  Administered 2016-01-02: 100 mL
  Filled 2016-01-02: qty 100

## 2016-01-02 MED ORDER — AMOXICILLIN-POT CLAVULANATE 875-125 MG PO TABS: 1.0000 | ORAL_TABLET | Freq: Two times a day (BID) | ORAL | 0 refills | Status: DC

## 2016-01-02 MED ORDER — AMOXICILLIN-POT CLAVULANATE 875-125 MG PO TABS
1.0000 | ORAL_TABLET | Freq: Once | ORAL | Status: AC
  Administered 2016-01-02: 1 via ORAL
  Filled 2016-01-02: qty 1

## 2016-01-02 MED ORDER — HYDROMORPHONE HCL 2 MG/ML IJ SOLN
1.0000 mg | Freq: Once | INTRAMUSCULAR | Status: AC
  Administered 2016-01-02: 1 mg via INTRAVENOUS
  Filled 2016-01-02: qty 1

## 2016-01-02 NOTE — ED Provider Notes (Signed)
[facility] DEPT Provider Note   CSN: [id number] Arrival date & time: 01/02/16  0900     History   Chief Complaint Chief Complaint  Patient presents with  . Chest Pain  . Nausea  . Emesis  . Abdominal Pain    [name] is a 65 y.o. male.  Timothy  65 year old male presents with vomiting since 1 AM. His wife and he had the same meal, she is currently symptomatic. He has had around 4 episodes of emesis. No blood. 2 episodes of loose stools. Has also been having abdominal pain that started shortly after the vomiting. Also having mid chest pain that he describes as dull. Started at 3 AM and he thinks is due to the vomiting. During previous episodes of vomiting he has had chest pain similarly. No shortness of breath or diaphoresis. Abdominal pain is mostly per umbilical across his entire abdomen. No urinary symptoms. No fevers. Earlier this month he had a right knee replacement. Has been taken off of the blood thinners and has not noticed any leg swelling or leg pain.  Past Medical History:  Diagnosis Date  . Allergy   . Chronic back pain    DDD  . Depression    takes Paxil daily  . GERD (gastroesophageal reflux disease)    takes Protonix daily  . History of bronchitis    many yrs ago  . History of kidney stones   . Hx of colonic polyps   . Hyperlipidemia    takes Lipitor daily  . Hypertension    takes Lisinopril-HCTZ daily  . Hypothyroidism    takes Synthroid daily  . Insomnia 12-10-12   not a problem now  . Joint pain   . Nocturia   . Osteoarthritis   . Pneumonia 2012   hx of   . Prostate cancer ([place])   . TIA (transient ischemic attack)    takes ASA daily    Patient Active Problem List   Diagnosis Date Noted  . 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 Dysuria 06/23/2015  . Impaired glucose tolerance 10/27/2013  . OA (osteoarthritis) of knee 12/16/2012  . Sciatica 05/02/2010  . HYPOTHYROIDISM 08/06/2009  . DYSPRAXIA 08/06/2009  . COLONIC POLYPS,  HX OF 08/06/2009  . URI 08/21/2007  . HYPERTENSION, BENIGN ESSENTIAL 10/15/2006  . DEPRESSION 10/11/2006  . ALLERGIC RHINITIS 10/11/2006  . Osteoarthritis 10/11/2006  . PROSTATE CANCER, HX OF 10/11/2006    Past Surgical History:  Procedure Laterality Date  . BACK SURGERY  12-10-12   '12-Lumbar fusion-retained hardware  . COLONOSCOPY    . ELBOW SURGERY    . HERNIA REPAIR Left    [facility]  . KNEE SURGERY Left    open surgery  . POLYPECTOMY    . PROSTATECTOMY    . TIBIA FRACTURE SURGERY    . TONSILLECTOMY    . TOTAL KNEE ARTHROPLASTY Left 12/16/2012   Procedure: LEFT TOTAL KNEE ARTHROPLASTY;  Surgeon: Gearlean Alf, MD;  Location: [facility];  Service: Orthopedics;  Laterality: Left;  . TOTAL KNEE ARTHROPLASTY Right 12/06/2015   Procedure: TOTAL KNEE ARTHROPLASTY;  Surgeon: Vickey Huger, MD;  Location: [facility];  Service: Orthopedics;  Laterality: Right;       Home Medications    Prior to Admission medications   Medication Sig Start Date End Date Taking? Authorizing Provider  aspirin EC 325 MG EC tablet Take 1 tablet (325 mg total) by mouth 2 (two) times daily. 12/07/15  Yes Donia Ast, PA  atorvastatin (LIPITOR) 40 MG tablet TAKE 1 TABLET BY MOUTH DAILY 11/27/14  Yes Marletta Lor, MD  levothyroxine (SYNTHROID, LEVOTHROID) 88 MCG tablet TAKE 1 TABLET BY MOUTH DAILY 11/04/15  Yes Marletta Lor, MD  lisinopril-hydrochlorothiazide (PRINZIDE,ZESTORETIC) 20-12.5 MG per tablet TAKE 1 TABLET BY MOUTH DAILY 11/27/14  Yes Marletta Lor, MD  methocarbamol (ROBAXIN) 500 MG tablet Take 1-2 tablets (500-1,000 mg total) by mouth every 6 (six) hours as needed for muscle spasms. 12/07/15  Yes Donia Ast, PA  Multiple Vitamin (MULTIVITAMIN) tablet Take 1 tablet by mouth daily.   Yes Historical Provider, MD  oxyCODONE (OXY IR/ROXICODONE) 5 MG immediate release tablet Take 1-2 tablets (5-10 mg total) by mouth every 3 (three) hours as needed for breakthrough pain. 12/07/15  Yes  Donia Ast, PA  pantoprazole (PROTONIX) 40 MG tablet Take 1 tablet (40 mg total) by mouth daily. 04/09/15  Yes Marletta Lor, MD  PARoxetine (PAXIL) 20 MG tablet TAKE 1 TABLET BY MOUTH DAILY IN THE EVENING 06/25/15  Yes Marletta Lor, MD  tadalafil (CIALIS) 20 MG tablet Take 1 tablet (20 mg total) by mouth daily as needed for erectile dysfunction. 04/09/15  Yes Marletta Lor, MD  traMADol (ULTRAM) 50 MG tablet Take 1 tablet (50 mg total) by mouth every 6 (six) hours as needed. 06/23/15  Yes Marletta Lor, MD  amoxicillin-clavulanate (AUGMENTIN) 875-125 MG tablet Take 1 tablet by mouth 2 (two) times daily. One po bid x 7 days 01/02/16   Sherwood Gambler, MD  ondansetron (ZOFRAN ODT) 4 MG disintegrating tablet Take 1 tablet (4 mg total) by mouth every 8 (eight) hours as needed for nausea or vomiting. 01/02/16   Sherwood Gambler, MD  ondansetron (ZOFRAN) 4 MG tablet Take 1 tablet (4 mg total) by mouth every 6 (six) hours as needed for nausea. Patient not taking: Reported on 01/02/2016 12/07/15   Donia Ast, PA    Family History Family History  Problem Relation Age of Onset  . Asthma Mother   . Heart attack Father   . Heart attack Sister   . Coronary artery disease      fhx  . Colon cancer Paternal Aunt     Social History Social History  Substance Use Topics  . Smoking status: Never Smoker  . Smokeless tobacco: Current User    Types: Chew     Comment: quit smoking cigars in 1985  . Alcohol use 1.0 oz/week    2 Standard drinks or equivalent per week     Comment: occasionally     Allergies   No known allergies   Review of Systems Review of Systems  Constitutional: Negative for fever.  Respiratory: Negative for shortness of breath.   Cardiovascular: Positive for chest pain. Negative for leg swelling.  Gastrointestinal: Positive for abdominal pain, diarrhea, nausea and vomiting. Negative for blood in stool.  Genitourinary: Negative for dysuria.  All  other systems reviewed and are negative.    Physical Exam Updated Vital Signs BP 150/87   Pulse 71   Temp 98.2 F (36.8 C) (Oral)   Resp 14   Ht 5\' 10"  (1.778 m)   Wt 230 lb (104.3 kg)   SpO2 94%   BMI 33.00 kg/m   Physical Exam  Constitutional: He is oriented to person, place, and time. He appears well-developed and well-nourished.  HENT:  Head: Normocephalic and atraumatic.  Right Ear: External ear normal.  Left Ear: External ear normal.  Nose: Nose normal.  Eyes: Right eye exhibits no discharge. Left eye exhibits no discharge.  Neck: Neck supple.  Cardiovascular: Normal rate, regular rhythm and normal heart sounds.   Pulmonary/Chest: Effort normal and breath sounds normal. He exhibits no tenderness.  Abdominal: Soft. There is tenderness (diffuse).  Musculoskeletal: He exhibits no edema.  Right knee wound appears well, no erythema, drainage or swelling  Neurological: He is alert and oriented to person, place, and time.  Skin: Skin is warm and dry. He is not diaphoretic.  Nursing note and vitals reviewed.    ED Treatments / Results  Labs (all labs ordered are listed, but only abnormal results are displayed) Labs Reviewed  COMPREHENSIVE METABOLIC PANEL - Abnormal; Notable for the following:       Result Value   Sodium 134 (*)    Chloride 100 (*)    CO2 20 (*)    Glucose, Bld 201 (*)    BUN 22 (*)    Total Bilirubin 1.4 (*)    All other components within normal limits  CBC WITH DIFFERENTIAL/PLATELET - Abnormal; Notable for the following:    Neutro Abs 8.6 (*)    All other components within normal limits  LIPASE, BLOOD  TROPONIN I    EKG  EKG Interpretation  Date/Time:  Sunday January 02 2016 09:03:44 EDT Ventricular Rate:  79 PR Interval:  134 QRS Duration: 96 QT Interval:  422 QTC Calculation: 483 R Axis:   36 Text Interpretation:  Normal sinus rhythm Nonspecific ST abnormality Prolonged QT Abnormal ECG no significant change since Sept 22 2017  Reconfirmed by Regenia Skeeter MD, [place] [phone]) on 01/02/2016 9:34:00 AM       Radiology Dg Chest 2 View  Result Date: 01/02/2016 CLINICAL DATA:  Abdominal pain and chest pain since 1 a.m. today EXAM: CHEST  2 VIEW COMPARISON:  11/26/2015 FINDINGS: Normal heart size. Lungs clear. No pneumothorax. No pleural effusion. Chronic left clavicle deformity. IMPRESSION: No active cardiopulmonary disease. Electronically Signed   By: Marybelle Killings M.D.   On: 01/02/2016 10:11   Ct Abdomen Pelvis W Contrast  Result Date: 01/02/2016 CLINICAL DATA:  Lower abdominal pain, vomiting EXAM: CT ABDOMEN AND PELVIS WITH CONTRAST TECHNIQUE: Multidetector CT imaging of the abdomen and pelvis was performed using the standard protocol following bolus administration of intravenous contrast. CONTRAST:  165mL ISOVUE-300 IOPAMIDOL (ISOVUE-300) INJECTION 61% COMPARISON:  02/18/2013 FINDINGS: Lower chest: Mild dependent atelectasis in the bilateral lower lobes. Hepatobiliary: Liver is within normal limits. Gallbladder is unremarkable. No intrahepatic or extrahepatic ductal dilatation. Pancreas: Within normal limits. Spleen: Within normal limits. Adrenals/Urinary Tract: Adrenal glands are within normal limits. 2 mm nonobstructing renal calculi in the right upper pole (series 201/ image 39) and a left lower pole (series 201/ image 49). Left renal sinus cysts. 7 mm cyst in the left lower pole (series 201/ image 49). No hydronephrosis. Bladder is within normal limits. Stomach/Bowel: Stomach is within normal limits. No evidence of bowel obstruction. Normal appendix (series 201/ image 55). Sigmoid diverticulosis, with very mild pericolonic stranding (series 201/image 88), raising the possibility of mild sigmoid diverticulitis. No drainable fluid collection/abscess.  No free air. Vascular/Lymphatic: No evidence of abdominal aortic aneurysm. Atherosclerotic calcifications of the abdominal aorta and branch vessels. Small upper abdominal lymph nodes  which do not meet pathologic CT size criteria. No suspicious abdominopelvic lymphadenopathy. Reproductive: Status post prostatectomy. Other: No abdominopelvic ascites. Musculoskeletal: Status post PLIF at L3-5. Degenerative changes of the visualized thoracolumbar spine. IMPRESSION: Possible mild sigmoid diverticulitis. No drainable fluid collection/abscess. No  free air. Additional ancillary findings as above. Electronically Signed   By: Julian Hy M.D.   On: 01/02/2016 12:41    Procedures Procedures (including critical care time)  Medications Ordered in ED Medications  sodium chloride 0.9 % bolus 500 mL (0 mLs Intravenous Stopped 01/02/16 1049)  ondansetron (ZOFRAN) injection 4 mg (4 mg Intravenous Given 01/02/16 0943)  HYDROmorphone (DILAUDID) injection 1 mg (1 mg Intravenous Given 01/02/16 0945)  iopamidol (ISOVUE-300) 61 % injection (100 mLs  Contrast Given 01/02/16 1137)  amoxicillin-clavulanate (AUGMENTIN) 875-125 MG per tablet 1 tablet (1 tablet Oral Given 01/02/16 1326)     Initial Impression / Assessment and Plan / ED Course  I have reviewed the triage vital signs and the nursing notes.  Pertinent labs & imaging results that were available during my care of the patient were reviewed by me and considered in my medical decision making (see chart for details).  Clinical Course  Comment By Time  I believe patient's chest pain is due to the vomiting rather than ACS, PE, or dissection. This is probably more abdominal given his diffuse abdominal tenderness, vomiting, and diarrhea. Check labs, CT scan, chest x-ray, and fluids with antiemetics and pain control. ECG is nonspecific but not changed from about 1 month ago. Sherwood Gambler, MD 10/29 [id number]  Patient is feeling better. Troponin negative. Labs unremarkable except mild dehydration with BUN of 22. Will dose dilaudid one more time (now more for knee pain than chest/abd). CT pending. Sherwood Gambler, MD 10/29 1101  CT shows likely  diverticulitis. Patient tells me he's had before and this feels similar. No current vomiting or fevers. No end organ damage or sepsis. Augmentin, zofran, and his oxycodone at home for symptoms control. Sherwood Gambler, MD 10/29 1316   I think the patient's CP is from the vomiting. Highly doubt ACS. While he is post op, PE seems unlikely as it is directly related to vomiting, no tachycardia, or hypoxia. Highly doubt esophageal injury/rupture.   Final Clinical Impressions(s) / ED Diagnoses   Final diagnoses:  Acute diverticulitis  Chest wall pain    New Prescriptions Discharge Medication List as of 01/02/2016  1:18 PM    START taking these medications   Details  amoxicillin-clavulanate (AUGMENTIN) 875-125 MG tablet Take 1 tablet by mouth 2 (two) times daily. One po bid x 7 days, Starting Sun 01/02/2016, Print    ondansetron (ZOFRAN ODT) 4 MG disintegrating tablet Take 1 tablet (4 mg total) by mouth every 8 (eight) hours as needed for nausea or vomiting., Starting Sun 01/02/2016, Print         Sherwood Gambler, MD 01/02/16 1702

## 2016-01-02 NOTE — ED Notes (Signed)
Unable to obtain temperature orally and axillary

## 2016-01-02 NOTE — ED Triage Notes (Signed)
Pt c/o n/v, abdominal pain and chest pain since 0100 this morning.  Pt reports vomiting multiple times with dry heaves. Abdominal pain is generalized radiating up to chest.

## 2016-01-03 ENCOUNTER — Telehealth: Payer: Self-pay | Admitting: Internal Medicine

## 2016-01-03 ENCOUNTER — Encounter (HOSPITAL_COMMUNITY): Payer: Self-pay | Admitting: Emergency Medicine

## 2016-01-03 ENCOUNTER — Emergency Department (HOSPITAL_COMMUNITY)
Admission: EM | Admit: 2016-01-03 | Discharge: 2016-01-03 | Disposition: A | Discharge status: Home / Self Care | Payer: Medicare Other | Attending: Emergency Medicine | Admitting: Emergency Medicine

## 2016-01-03 ENCOUNTER — Emergency Department (HOSPITAL_COMMUNITY): Payer: Medicare Other

## 2016-01-03 DIAGNOSIS — Z79899 Other long term (current) drug therapy: Secondary | ICD-10-CM | POA: Diagnosis not present

## 2016-01-03 DIAGNOSIS — I10 Essential (primary) hypertension: Secondary | ICD-10-CM | POA: Diagnosis not present

## 2016-01-03 DIAGNOSIS — F17220 Nicotine dependence, chewing tobacco, uncomplicated: Secondary | ICD-10-CM | POA: Insufficient documentation

## 2016-01-03 DIAGNOSIS — E039 Hypothyroidism, unspecified: Secondary | ICD-10-CM | POA: Insufficient documentation

## 2016-01-03 DIAGNOSIS — Z8546 Personal history of malignant neoplasm of prostate: Secondary | ICD-10-CM | POA: Insufficient documentation

## 2016-01-03 DIAGNOSIS — R1084 Generalized abdominal pain: Secondary | ICD-10-CM | POA: Diagnosis not present

## 2016-01-03 DIAGNOSIS — Z7982 Long term (current) use of aspirin: Secondary | ICD-10-CM | POA: Diagnosis not present

## 2016-01-03 DIAGNOSIS — R109 Unspecified abdominal pain: Secondary | ICD-10-CM | POA: Diagnosis not present

## 2016-01-03 LAB — I-STAT CHEM 8, ED
BUN: 25 mg/dL — ABNORMAL HIGH (ref 6–20)
CALCIUM ION: 1.17 mmol/L (ref 1.15–1.40)
CHLORIDE: 101 mmol/L (ref 101–111)
Creatinine, Ser: 1.2 mg/dL (ref 0.61–1.24)
GLUCOSE: 155 mg/dL — AB (ref 65–99)
HCT: 47 % (ref 39.0–52.0)
HEMOGLOBIN: 16 g/dL (ref 13.0–17.0)
Potassium: 3.5 mmol/L (ref 3.5–5.1)
Sodium: 140 mmol/L (ref 135–145)
TCO2: 26 mmol/L (ref 0–100)

## 2016-01-03 LAB — CBC WITH DIFFERENTIAL/PLATELET
Basophils Absolute: 0 10*3/uL (ref 0.0–0.1)
Basophils Relative: 0 %
EOS ABS: 0 10*3/uL (ref 0.0–0.7)
Eosinophils Relative: 0 %
HEMATOCRIT: 45.4 % (ref 39.0–52.0)
HEMOGLOBIN: 15.1 g/dL (ref 13.0–17.0)
LYMPHS ABS: 1.5 10*3/uL (ref 0.7–4.0)
Lymphocytes Relative: 14 %
MCH: 30.6 pg (ref 26.0–34.0)
MCHC: 33.3 g/dL (ref 30.0–36.0)
MCV: 91.9 fL (ref 78.0–100.0)
MONOS PCT: 10 %
Monocytes Absolute: 1 10*3/uL (ref 0.1–1.0)
NEUTROS ABS: 7.8 10*3/uL — AB (ref 1.7–7.7)
NEUTROS PCT: 76 %
Platelets: 340 10*3/uL (ref 150–400)
RBC: 4.94 MIL/uL (ref 4.22–5.81)
RDW: 14.3 % (ref 11.5–15.5)
WBC: 10.3 10*3/uL (ref 4.0–10.5)

## 2016-01-03 LAB — COMPREHENSIVE METABOLIC PANEL
ALT: 18 U/L (ref 17–63)
AST: 33 U/L (ref 15–41)
Albumin: 4.9 g/dL (ref 3.5–5.0)
Alkaline Phosphatase: 106 U/L (ref 38–126)
Anion gap: 11 (ref 5–15)
BILIRUBIN TOTAL: 1.6 mg/dL — AB (ref 0.3–1.2)
BUN: 26 mg/dL — ABNORMAL HIGH (ref 6–20)
CALCIUM: 10 mg/dL (ref 8.9–10.3)
CO2: 24 mmol/L (ref 22–32)
Chloride: 102 mmol/L (ref 101–111)
Creatinine, Ser: 1.18 mg/dL (ref 0.61–1.24)
Glucose, Bld: 157 mg/dL — ABNORMAL HIGH (ref 65–99)
Potassium: 3.5 mmol/L (ref 3.5–5.1)
Sodium: 137 mmol/L (ref 135–145)
TOTAL PROTEIN: 8 g/dL (ref 6.5–8.1)

## 2016-01-03 LAB — I-STAT CG4 LACTIC ACID, ED: LACTIC ACID, VENOUS: 2.81 mmol/L — AB (ref 0.5–1.9)

## 2016-01-03 LAB — LIPASE, BLOOD: Lipase: 24 U/L (ref 11–51)

## 2016-01-03 MED ORDER — IOPAMIDOL (ISOVUE-370) INJECTION 76%
100.0000 mL | Freq: Once | INTRAVENOUS | Status: AC | PRN
  Administered 2016-01-03: 100 mL via INTRAVENOUS

## 2016-01-03 MED ORDER — DIPHENHYDRAMINE HCL 50 MG/ML IJ SOLN
25.0000 mg | Freq: Once | INTRAMUSCULAR | Status: AC
  Administered 2016-01-03: 25 mg via INTRAVENOUS
  Filled 2016-01-03: qty 1

## 2016-01-03 MED ORDER — PROCHLORPERAZINE EDISYLATE 5 MG/ML IJ SOLN
10.0000 mg | Freq: Once | INTRAMUSCULAR | Status: AC
  Administered 2016-01-03: 10 mg via INTRAVENOUS
  Filled 2016-01-03: qty 2

## 2016-01-03 MED ORDER — SODIUM CHLORIDE 0.9 % IV BOLUS (SEPSIS)
1000.0000 mL | Freq: Once | INTRAVENOUS | Status: AC
  Administered 2016-01-03: 1000 mL via INTRAVENOUS

## 2016-01-03 MED ORDER — PROMETHAZINE HCL 25 MG PO TABS: 25.0000 mg | ORAL_TABLET | Freq: Four times a day (QID) | ORAL | 0 refills | Status: DC | PRN

## 2016-01-03 MED ORDER — OXYCODONE HCL 5 MG PO TABS: 5.0000 mg | ORAL_TABLET | ORAL | 0 refills | Status: DC | PRN

## 2016-01-03 MED ORDER — MORPHINE SULFATE (PF) 2 MG/ML IV SOLN
4.0000 mg | Freq: Once | INTRAVENOUS | Status: AC
  Administered 2016-01-03: 4 mg via INTRAVENOUS
  Filled 2016-01-03: qty 2

## 2016-01-03 MED ORDER — ONDANSETRON HCL 4 MG/2ML IJ SOLN
4.0000 mg | Freq: Once | INTRAMUSCULAR | Status: AC
  Administered 2016-01-03: 4 mg via INTRAVENOUS
  Filled 2016-01-03: qty 2

## 2016-01-03 MED ORDER — HYDROMORPHONE HCL 1 MG/ML IJ SOLN
1.0000 mg | Freq: Once | INTRAMUSCULAR | Status: AC
  Administered 2016-01-03: 1 mg via INTRAVENOUS
  Filled 2016-01-03: qty 1

## 2016-01-03 NOTE — ED Notes (Signed)
Pt O2 sats dropped to 80% on room air after dilaudid. Pt placed on O2 2lpm. O2 sats 96%.

## 2016-01-03 NOTE — ED Notes (Signed)
Bed: WA02 Expected date:  Expected time:  Means of arrival:  Comments: EMS- 65yo M, lower abdominal pain and n/v

## 2016-01-03 NOTE — ED Provider Notes (Signed)
[facility] DEPT Provider Note   CSN: [id number] Arrival date & time: 01/03/16  0946     History   Chief Complaint Chief Complaint  Patient presents with  . Abdominal Pain    HPI [name] is a 65 y.o. male.  65 yo M with a chief complaint of abdominal pain. This been going on for the past few days. He was seen yesterday and diagnosed with diverticulitis. Took his first dose of antibiotics this morning. He has had continued pain and vomiting since being seen yesterday. Denies fevers or chills denies diarrhea. Had some chest pain with the initial vomiting episode but feels that this has improved significantly. Denies shortness of breath.   The history is provided by the patient.  Abdominal Pain   This is a new problem. The current episode started less than 1 hour ago. The problem occurs constantly. The problem has not changed since onset.The pain is located in the generalized abdominal region. The quality of the pain is colicky, burning and sharp. The pain is at a severity of 10/10. The pain is severe. Associated symptoms include nausea and vomiting. Pertinent negatives include fever, diarrhea, headaches, arthralgias and myalgias. Nothing aggravates the symptoms. Nothing relieves the symptoms. Past workup includes CT scan.    Past Medical History:  Diagnosis Date  . Allergy   . Chronic back pain    DDD  . Depression    takes Paxil daily  . GERD (gastroesophageal reflux disease)    takes Protonix daily  . History of bronchitis    many yrs ago  . History of kidney stones   . Hx of colonic polyps   . Hyperlipidemia    takes Lipitor daily  . Hypertension    takes Lisinopril-HCTZ daily  . Hypothyroidism    takes Synthroid daily  . Insomnia 12-10-12   not a problem now  . Joint pain   . Nocturia   . Osteoarthritis   . Pneumonia 2012   hx of   . Prostate cancer ([place])   . TIA (transient ischemic attack)    takes ASA daily    Patient Active Problem List   Diagnosis Date Noted  . 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 Dysuria 06/23/2015  . Impaired glucose tolerance 10/27/2013  . OA (osteoarthritis) of knee 12/16/2012  . Sciatica 05/02/2010  . HYPOTHYROIDISM 08/06/2009  . DYSPRAXIA 08/06/2009  . COLONIC POLYPS, HX OF 08/06/2009  . URI 08/21/2007  . HYPERTENSION, BENIGN ESSENTIAL 10/15/2006  . DEPRESSION 10/11/2006  . ALLERGIC RHINITIS 10/11/2006  . Osteoarthritis 10/11/2006  . PROSTATE CANCER, HX OF 10/11/2006    Past Surgical History:  Procedure Laterality Date  . BACK SURGERY  12-10-12   '12-Lumbar fusion-retained hardware  . COLONOSCOPY    . ELBOW SURGERY    . HERNIA REPAIR Left    [facility]  . KNEE SURGERY Left    open surgery  . POLYPECTOMY    . PROSTATECTOMY    . TIBIA FRACTURE SURGERY    . TONSILLECTOMY    . TOTAL KNEE ARTHROPLASTY Left 12/16/2012   Procedure: LEFT TOTAL KNEE ARTHROPLASTY;  Surgeon: Gearlean Alf, MD;  Location: [facility];  Service: Orthopedics;  Laterality: Left;  . TOTAL KNEE ARTHROPLASTY Right 12/06/2015   Procedure: TOTAL KNEE ARTHROPLASTY;  Surgeon: Vickey Huger, MD;  Location: [facility];  Service: Orthopedics;  Laterality: Right;       Home Medications    Prior to Admission medications   Medication Sig Start Date End Date  Taking? Authorizing Provider  amoxicillin-clavulanate (AUGMENTIN) 875-125 MG tablet Take 1 tablet by mouth 2 (two) times daily. One po bid x 7 days 01/02/16  Yes Sherwood Gambler, MD  aspirin EC 325 MG EC tablet Take 1 tablet (325 mg total) by mouth 2 (two) times daily. 12/07/15  Yes Donia Ast, PA  atorvastatin (LIPITOR) 40 MG tablet TAKE 1 TABLET BY MOUTH DAILY 11/27/14  Yes Marletta Lor, MD  levothyroxine (SYNTHROID, LEVOTHROID) 88 MCG tablet TAKE 1 TABLET BY MOUTH DAILY 11/04/15  Yes Marletta Lor, MD  lisinopril-hydrochlorothiazide (PRINZIDE,ZESTORETIC) 20-12.5 MG per tablet TAKE 1 TABLET BY MOUTH DAILY 11/27/14  Yes Marletta Lor, MD  methocarbamol (ROBAXIN)  500 MG tablet Take 1-2 tablets (500-1,000 mg total) by mouth every 6 (six) hours as needed for muscle spasms. 12/07/15  Yes Donia Ast, PA  ondansetron (ZOFRAN ODT) 4 MG disintegrating tablet Take 1 tablet (4 mg total) by mouth every 8 (eight) hours as needed for nausea or vomiting. 01/02/16  Yes Sherwood Gambler, MD  oxyCODONE (OXY IR/ROXICODONE) 5 MG immediate release tablet Take 1-2 tablets (5-10 mg total) by mouth every 3 (three) hours as needed for breakthrough pain. 12/07/15  Yes Donia Ast, PA  pantoprazole (PROTONIX) 40 MG tablet Take 1 tablet (40 mg total) by mouth daily. 04/09/15  Yes Marletta Lor, MD  PARoxetine (PAXIL) 20 MG tablet TAKE 1 TABLET BY MOUTH DAILY IN THE EVENING 06/25/15  Yes Marletta Lor, MD  tadalafil (CIALIS) 20 MG tablet Take 1 tablet (20 mg total) by mouth daily as needed for erectile dysfunction. 04/09/15  Yes Marletta Lor, MD  traMADol (ULTRAM) 50 MG tablet Take 1 tablet (50 mg total) by mouth every 6 (six) hours as needed. 06/23/15  Yes Marletta Lor, MD  Multiple Vitamin (MULTIVITAMIN) tablet Take 1 tablet by mouth daily.    Historical Provider, MD  ondansetron (ZOFRAN) 4 MG tablet Take 1 tablet (4 mg total) by mouth every 6 (six) hours as needed for nausea. Patient not taking: Reported on 01/02/2016 12/07/15   Donia Ast, PA  oxyCODONE (ROXICODONE) 5 MG immediate release tablet Take 1 tablet (5 mg total) by mouth every 4 (four) hours as needed for severe pain. 01/03/16   Deno Etienne, DO  promethazine (PHENERGAN) 25 MG tablet Take 1 tablet (25 mg total) by mouth every 6 (six) hours as needed for nausea or vomiting. 01/03/16   Deno Etienne, DO    Family History Family History  Problem Relation Age of Onset  . Asthma Mother   . Heart attack Father   . Heart attack Sister   . Coronary artery disease      fhx  . Colon cancer Paternal Aunt     Social History Social History  Substance Use Topics  . Smoking status: Never Smoker   . Smokeless tobacco: Current User    Types: Chew     Comment: quit smoking cigars in 1985  . Alcohol use 1.0 oz/week    2 Standard drinks or equivalent per week     Comment: occasionally     Allergies   No known allergies   Review of Systems Review of Systems  Constitutional: Negative for chills and fever.  HENT: Negative for congestion and facial swelling.   Eyes: Negative for discharge and visual disturbance.  Respiratory: Negative for shortness of breath.   Cardiovascular: Negative for chest pain and palpitations.  Gastrointestinal: Positive for abdominal pain, nausea and vomiting. Negative for  diarrhea.  Musculoskeletal: Negative for arthralgias and myalgias.  Skin: Negative for color change and rash.  Neurological: Negative for tremors, syncope and headaches.  Psychiatric/Behavioral: Negative for confusion and dysphoric mood.     Physical Exam Updated Vital Signs BP 121/70 (BP Location: Right Arm)   Pulse 72   Temp 98 F (36.7 C) (Oral)   Resp 18   Ht 5\' 10"  (1.778 m)   Wt 230 lb (104.3 kg)   SpO2 96%   BMI 33.00 kg/m   Physical Exam  Constitutional: He is oriented to person, place, and time. He appears well-developed and well-nourished.  HENT:  Head: Normocephalic and atraumatic.  Eyes: EOM are normal. Pupils are equal, round, and reactive to light.  Neck: Normal range of motion. Neck supple. No JVD present.  Cardiovascular: Normal rate and regular rhythm.  Exam reveals no gallop and no friction rub.   No murmur heard. Pulmonary/Chest: No respiratory distress. He has no wheezes.  Abdominal: He exhibits no distension and no mass. There is tenderness (diffuse and non focal). There is no rebound and no guarding.  Musculoskeletal: Normal range of motion.  Neurological: He is alert and oriented to person, place, and time.  Skin: No rash noted. No pallor.  Psychiatric: He has a normal mood and affect. His behavior is normal.  Nursing note and vitals  reviewed.    ED Treatments / Results  Labs (all labs ordered are listed, but only abnormal results are displayed) Labs Reviewed  CBC WITH DIFFERENTIAL/PLATELET - Abnormal; Notable for the following:       Result Value   Neutro Abs 7.8 (*)    All other components within normal limits  COMPREHENSIVE METABOLIC PANEL - Abnormal; Notable for the following:    Glucose, Bld 157 (*)    BUN 26 (*)    Total Bilirubin 1.6 (*)    All other components within normal limits  I-STAT CG4 LACTIC ACID, ED - Abnormal; Notable for the following:    Lactic Acid, Venous 2.81 (*)    All other components within normal limits  I-STAT CHEM 8, ED - Abnormal; Notable for the following:    BUN 25 (*)    Glucose, Bld 155 (*)    All other components within normal limits  LIPASE, BLOOD    EKG  EKG Interpretation None       Radiology Dg Chest 2 View  Result Date: 01/02/2016 CLINICAL DATA:  Abdominal pain and chest pain since 1 a.m. today EXAM: CHEST  2 VIEW COMPARISON:  11/26/2015 FINDINGS: Normal heart size. Lungs clear. No pneumothorax. No pleural effusion. Chronic left clavicle deformity. IMPRESSION: No active cardiopulmonary disease. Electronically Signed   By: Marybelle Killings M.D.   On: 01/02/2016 10:11   Ct Abdomen Pelvis W Contrast  Result Date: 01/02/2016 CLINICAL DATA:  Lower abdominal pain, vomiting EXAM: CT ABDOMEN AND PELVIS WITH CONTRAST TECHNIQUE: Multidetector CT imaging of the abdomen and pelvis was performed using the standard protocol following bolus administration of intravenous contrast. CONTRAST:  166mL ISOVUE-300 IOPAMIDOL (ISOVUE-300) INJECTION 61% COMPARISON:  02/18/2013 FINDINGS: Lower chest: Mild dependent atelectasis in the bilateral lower lobes. Hepatobiliary: Liver is within normal limits. Gallbladder is unremarkable. No intrahepatic or extrahepatic ductal dilatation. Pancreas: Within normal limits. Spleen: Within normal limits. Adrenals/Urinary Tract: Adrenal glands are within  normal limits. 2 mm nonobstructing renal calculi in the right upper pole (series 201/ image 39) and a left lower pole (series 201/ image 49). Left renal sinus cysts. 7 mm cyst in  the left lower pole (series 201/ image 49). No hydronephrosis. Bladder is within normal limits. Stomach/Bowel: Stomach is within normal limits. No evidence of bowel obstruction. Normal appendix (series 201/ image 55). Sigmoid diverticulosis, with very mild pericolonic stranding (series 201/image 88), raising the possibility of mild sigmoid diverticulitis. No drainable fluid collection/abscess.  No free air. Vascular/Lymphatic: No evidence of abdominal aortic aneurysm. Atherosclerotic calcifications of the abdominal aorta and branch vessels. Small upper abdominal lymph nodes which do not meet pathologic CT size criteria. No suspicious abdominopelvic lymphadenopathy. Reproductive: Status post prostatectomy. Other: No abdominopelvic ascites. Musculoskeletal: Status post PLIF at L3-5. Degenerative changes of the visualized thoracolumbar spine. IMPRESSION: Possible mild sigmoid diverticulitis. No drainable fluid collection/abscess. No free air. Additional ancillary findings as above. Electronically Signed   By: Julian Hy M.D.   On: 01/02/2016 12:41   Ct Angio Abd/pel W And/or Wo Contrast  Result Date: 01/03/2016 CLINICAL DATA:  65 year old male with a history of mid abdominal pain. EXAM: CT ABDOMEN AND PELVIS WITH CONTRAST TECHNIQUE: Multidetector CT imaging of the abdomen and pelvis was performed using the standard protocol following bolus administration of intravenous contrast. CONTRAST:  100 cc Isovue 370 COMPARISON:  CT 01/02/2016, 02/18/2013 FINDINGS: Lower chest: No acute abnormality. Hepatobiliary: Diffusely decreased attenuation/ enhancement of liver parenchyma. No focal lesion. Pancreas: Unremarkable. No pancreatic ductal dilatation or surrounding inflammatory changes. Spleen: Normal in size without focal abnormality.  Adrenals/Urinary Tract: Adrenal glands are unremarkable. Kidneys are normal, without renal calculi, focal lesion, or hydronephrosis. Bladder is unremarkable. Stomach/Bowel: No abnormally distended small bowel or colon. No transition point. No inflammatory changes adjacent to stomach small bowel or colon. Normal appendix. Diverticular changes of the sigmoid colon mild colonic wall thickening of decompressed colon. Overall, appearance similar to comparison. No focal fluid collection or evidence of perforation. Vascular/Lymphatic: Mild atherosclerotic changes of the abdominal aorta. No aneurysm or dissection. No significant disease of the bilateral iliac arteries. Unremarkable appearance of the venous structures. Reproductive: Unremarkable appearance of the pelvic organs. Other: No abdominal wall hernia or abnormality. No abdominopelvic ascites. Musculoskeletal: Surgical changes of lumbar spine repair spanning L3-L5 with bilateral pedicle screw and rod fixation. No displaced fracture. Degenerative changes of the L5-S1 level. IMPRESSION: Similar appearance to the comparison CT, with sigmoid diverticular disease an circumferential colonic wall thickening, potentially representing early diverticulitis. No complicating features. These results were called by telephone at the time of interpretation on 01/03/2016 at 12:13 pm to Dr. Deno Etienne , who verbally acknowledged these results. Mild changes of aortic atherosclerosis. Steatosis. Signed, Dulcy Fanny. Earleen Newport, DO [facility] Electronically Signed   By: Corrie Mckusick D.O.   On: 01/03/2016 12:13    Procedures Procedures (including critical care time)  Medications Ordered in ED Medications  sodium chloride 0.9 % bolus 1,000 mL (0 mLs Intravenous Stopped 01/03/16 1247)  ondansetron (ZOFRAN) injection 4 mg (4 mg Intravenous Given 01/03/16 1027)  morphine 2 MG/ML injection 4 mg (4 mg Intravenous Given 01/03/16 1027)   HYDROmorphone (DILAUDID) injection 1 mg (1 mg Intravenous Given 01/03/16 1149)  prochlorperazine (COMPAZINE) injection 10 mg (10 mg Intravenous Given 01/03/16 1148)  diphenhydrAMINE (BENADRYL) injection 25 mg (25 mg Intravenous Given 01/03/16 1148)  iopamidol (ISOVUE-370) 76 % injection 100 mL (100 mLs Intravenous Contrast Given 01/03/16 1117)     Initial Impression / Assessment and Plan / ED Course  I have reviewed the triage vital signs and the nursing notes.  Pertinent labs & imaging results that were available during my  care of the patient were reviewed by me and considered in my medical decision making (see chart for details).  Clinical Course    65 yo M With a chief complaint of abdominal pain. This is diffuse and nonfocal. Patient does appear significantly uncomfortable on my exam. The CT scan less than 12 hours ago I am unsure of the utility of repeat this time. Will attempt to treat his pain and nausea.   Patient appears to be in severe pain is pacing back and forth in the room. I obtained a CT angiogram of the abdomen. I discussed this with the radiologist did not find any significant findings. Will have the patient follow-up with his family physician. Given a different nausea medicine.  6:12 PM:  I have discussed the diagnosis/risks/treatment options with the patient and family and believe the pt to be eligible for discharge home to follow-up with PCP. We also discussed returning to the ED immediately if new or worsening sx occur. We discussed the sx which are most concerning (e.g., sudden worsening pain, fever, inability to tolerate by mouth) that necessitate immediate return. Medications administered to the patient during their visit and any new prescriptions provided to the patient are listed below.  Medications given during this visit Medications  sodium chloride 0.9 % bolus 1,000 mL (0 mLs Intravenous Stopped 01/03/16 1247)  ondansetron (ZOFRAN) injection 4 mg (4 mg  Intravenous Given 01/03/16 1027)  morphine 2 MG/ML injection 4 mg (4 mg Intravenous Given 01/03/16 1027)  HYDROmorphone (DILAUDID) injection 1 mg (1 mg Intravenous Given 01/03/16 1149)  prochlorperazine (COMPAZINE) injection 10 mg (10 mg Intravenous Given 01/03/16 1148)  diphenhydrAMINE (BENADRYL) injection 25 mg (25 mg Intravenous Given 01/03/16 1148)  iopamidol (ISOVUE-370) 76 % injection 100 mL (100 mLs Intravenous Contrast Given 01/03/16 1117)     The patient appears reasonably screen and/or stabilized for discharge and I doubt any other medical condition or other [facility] requiring further screening, evaluation, or treatment in the ED at this time prior to discharge.   Final Clinical Impressions(s) / ED Diagnoses   Final diagnoses:  Generalized abdominal pain    New Prescriptions Discharge Medication List as of 01/03/2016 12:42 PM    START taking these medications   Details  !! oxyCODONE (ROXICODONE) 5 MG immediate release tablet Take 1 tablet (5 mg total) by mouth every 4 (four) hours as needed for severe pain., Starting Mon 01/03/2016, Print    promethazine (PHENERGAN) 25 MG tablet Take 1 tablet (25 mg total) by mouth every 6 (six) hours as needed for nausea or vomiting., Starting Mon 01/03/2016, Print     !! - Potential duplicate medications found. Please discuss with provider.       Deno Etienne, DO 01/03/16 [phone]

## 2016-01-03 NOTE — ED Notes (Signed)
Patient reports abdominal pain has subsided at present-will reevaulate.  Requests medications held at present.

## 2016-01-03 NOTE — ED Triage Notes (Signed)
Per EMS patient seen at [facility] yesterday for same.  Patient c/o lower abdominal pain.  Diagnosed with diverticulitis-unable keep down food or fluids since yesterday.  No vomiting per EMS.  Patient reports last episode of emesis was this morning.

## 2016-01-03 NOTE — Discharge Instructions (Signed)

## 2016-01-03 NOTE — Telephone Encounter (Signed)
Patient Name: [name] DOB: Sep 13, 1950 Initial Comment Caller states c/o severe abdominal pain, vomiting, chills and unable to keep any fluid/foods down. He was diagnosed with Diverticulits Sunday. Nurse Assessment Nurse: Vallery Sa, RN, C[place] Date/Time (Eastern Time): 01/03/2016 9:13:22 AM Confirm and document reason for call. If symptomatic, describe symptoms. You must click the next button to save text entered. ---Caller states he was diagnosed with Diverticulitis yesterday and has severe lower abdominal pain (rated as an 8 on the 1 to 10 scale). He is having vomiting and chills. No severe breathing difficulty. No injury in the past 3 days. Has the patient traveled out of the country within the last 30 days? ---No Does the patient have any new or worsening symptoms? ---Yes Will a triage be completed? ---Yes Related visit to physician within the last 2 weeks? ---Yes Does the PT have any chronic conditions? (i.e. diabetes, asthma, etc.) ---Yes List chronic conditions. ---Diverticulitis, Knee replacement about a month ago Is this a behavioral health or substance abuse call? ---No Guidelines Guideline Title Affirmed Question Affirmed Notes Abdominal Pain - Male Shock suspected (e.g., cold/pale/clammy skin, too weak to stand, low BP, rapid pulse) Final Disposition User Call EMS 911 Now Vallery Sa, RN, [place] Disagree/Comply: Comply Connected with EMS.

## 2016-01-03 NOTE — Telephone Encounter (Signed)
Pt has checked in to [facility] ED. Nothing further needed at this time.

## 2016-01-06 ENCOUNTER — Encounter: Payer: Self-pay | Admitting: Internal Medicine

## 2016-01-06 ENCOUNTER — Ambulatory Visit (INDEPENDENT_AMBULATORY_CARE_PROVIDER_SITE_OTHER): Payer: Medicare Other | Admitting: Internal Medicine

## 2016-01-06 VITALS — BP 140/94 | HR 80 | Temp 99.5°F | Resp 20 | Ht 70.0 in | Wt 233.2 lb

## 2016-01-06 DIAGNOSIS — I10 Essential (primary) hypertension: Secondary | ICD-10-CM

## 2016-01-06 DIAGNOSIS — M150 Primary generalized (osteo)arthritis: Secondary | ICD-10-CM

## 2016-01-06 DIAGNOSIS — R1115 Cyclical vomiting syndrome unrelated to migraine: Secondary | ICD-10-CM

## 2016-01-06 DIAGNOSIS — G43A0 Cyclical vomiting, not intractable: Secondary | ICD-10-CM

## 2016-01-06 DIAGNOSIS — R1013 Epigastric pain: Secondary | ICD-10-CM | POA: Diagnosis not present

## 2016-01-06 DIAGNOSIS — R7302 Impaired glucose tolerance (oral): Secondary | ICD-10-CM

## 2016-01-06 DIAGNOSIS — M159 Polyosteoarthritis, unspecified: Secondary | ICD-10-CM

## 2016-01-06 NOTE — Progress Notes (Signed)
Subjective:    Patient ID: [name], male    DOB: 09-04-1950, 65 y.o.   MRN: [id number]  HPI ADMISSION DATE:    12/06/2015 DISCHARGE DATE:   12/07/2015   ADMISSION DIAGNOSIS: primary osteoarthritis right knee    DISCHARGE DIAGNOSIS:  primary osteoarthritis right knee    ADDITIONAL DIAGNOSIS: Active Problems:   S/P total knee replacement  65 year old patient who is seen today in follow-up after 2 recent ED visits.  He is status post right total knee replacement therapy about one month ago.  4 days ago, During the night he had the abrupt onset of intractable nausea and vomiting.  ED evaluation included CT abdominal scan that revealed radiographic findings consistent with mild left-sided diverticular disease.  He was placed on Augmentin that he took for less than 2 days.  He returned the following day with recurrent nausea and vomiting and Protonix was added to his regimen.  He continues to improve and is tolerating a full liquid diet.  He has some anorexia, but no recurrent nausea and vomiting.  His abdominal pain has resolved.  He had a normal stool earlier today  ED records, lab and x-rays reviewed.  Did reveal some stress hyperglycemia  Past Medical History:  Diagnosis Date  . Allergy   . Chronic back pain    DDD  . Depression    takes Paxil daily  . GERD (gastroesophageal reflux disease)    takes Protonix daily  . History of bronchitis    many yrs ago  . History of kidney stones   . Hx of colonic polyps   . Hyperlipidemia    takes Lipitor daily  . Hypertension    takes Lisinopril-HCTZ daily  . Hypothyroidism    takes Synthroid daily  . Insomnia 12-10-12   not a problem now  . Joint pain   . Nocturia   . Osteoarthritis   . Pneumonia 2012   hx of   . Prostate cancer ([place])   . TIA (transient ischemic attack)    takes ASA daily     Social History   Social History  . Marital status: Single    Spouse name: N/A  . Number of children: N/A  . Years of  education: N/A   Occupational History  . Not on file.   Social History Main Topics  . Smoking status: Never Smoker  . Smokeless tobacco: Current User    Types: Chew     Comment: quit smoking cigars in 1985  . Alcohol use 1.0 oz/week    2 Standard drinks or equivalent per week     Comment: occasionally  . Drug use: No  . Sexual activity: Not on file   Other Topics Concern  . Not on file   Social History Narrative  . No narrative on file    Past Surgical History:  Procedure Laterality Date  . BACK SURGERY  12-10-12   '12-Lumbar fusion-retained hardware  . COLONOSCOPY    . ELBOW SURGERY    . HERNIA REPAIR Left    [facility]  . KNEE SURGERY Left    open surgery  . POLYPECTOMY    . PROSTATECTOMY    . TIBIA FRACTURE SURGERY    . TONSILLECTOMY    . TOTAL KNEE ARTHROPLASTY Left 12/16/2012   Procedure: LEFT TOTAL KNEE ARTHROPLASTY;  Surgeon: Gearlean Alf, MD;  Location: [facility];  Service: Orthopedics;  Laterality: Left;  . TOTAL KNEE ARTHROPLASTY Right 12/06/2015   Procedure: TOTAL KNEE ARTHROPLASTY;  Surgeon: Vickey Huger, MD;  Location: [facility];  Service: Orthopedics;  Laterality: Right;    Family History  Problem Relation Age of Onset  . Asthma Mother   . Heart attack Father   . Heart attack Sister   . Coronary artery disease      fhx  . Colon cancer Paternal Aunt     Allergies  Allergen Reactions  . No Known Allergies     Current Outpatient Prescriptions on File Prior to Visit  Medication Sig Dispense Refill  . aspirin EC 325 MG EC tablet Take 1 tablet (325 mg total) by mouth 2 (two) times daily. 30 tablet 0  . atorvastatin (LIPITOR) 40 MG tablet TAKE 1 TABLET BY MOUTH DAILY 90 tablet 1  . levothyroxine (SYNTHROID, LEVOTHROID) 88 MCG tablet TAKE 1 TABLET BY MOUTH DAILY 30 tablet 5  . lisinopril-hydrochlorothiazide (PRINZIDE,ZESTORETIC) 20-12.5 MG per tablet TAKE 1 TABLET BY MOUTH DAILY 90 tablet 1  . methocarbamol (ROBAXIN) 500 MG tablet Take 1-2 tablets (500-1,000 mg  total) by mouth every 6 (six) hours as needed for muscle spasms. 60 tablet 0  . Multiple Vitamin (MULTIVITAMIN) tablet Take 1 tablet by mouth daily.    Marland Kitchen oxyCODONE (OXY IR/ROXICODONE) 5 MG immediate release tablet Take 1-2 tablets (5-10 mg total) by mouth every 3 (three) hours as needed for breakthrough pain. 90 tablet 0  . pantoprazole (PROTONIX) 40 MG tablet Take 1 tablet (40 mg total) by mouth daily. 30 tablet 3  . PARoxetine (PAXIL) 20 MG tablet TAKE 1 TABLET BY MOUTH DAILY IN THE EVENING 90 tablet 1  . promethazine (PHENERGAN) 25 MG tablet Take 1 tablet (25 mg total) by mouth every 6 (six) hours as needed for nausea or vomiting. 10 tablet 0  . tadalafil (CIALIS) 20 MG tablet Take 1 tablet (20 mg total) by mouth daily as needed for erectile dysfunction. 12 tablet 2  . traMADol (ULTRAM) 50 MG tablet Take 1 tablet (50 mg total) by mouth every 6 (six) hours as needed. 90 tablet 2  . amoxicillin-clavulanate (AUGMENTIN) 875-125 MG tablet Take 1 tablet by mouth 2 (two) times daily. One po bid x 7 days (Patient not taking: Reported on 01/06/2016) 14 tablet 0   No current facility-administered medications on file prior to visit.     BP (!) 140/94 (BP Location: Left Arm, Patient Position: Sitting, Cuff Size: Large)   Pulse 80   Temp 99.5 F (37.5 C) (Oral)   Resp 20   Ht 5\' 10"  (1.778 m)   Wt 233 lb 4 oz (105.8 kg)   SpO2 97%   BMI 33.47 kg/m      Review of Systems  Constitutional: Positive for activity change, appetite change and fatigue. Negative for chills and fever.  HENT: Negative for congestion, dental problem, ear pain, hearing loss, sore throat, tinnitus, trouble swallowing and voice change.   Eyes: Negative for pain, discharge and visual disturbance.  Respiratory: Negative for cough, chest tightness, wheezing and stridor.   Cardiovascular: Negative for chest pain, palpitations and leg swelling.  Gastrointestinal: Positive for abdominal pain, nausea and vomiting. Negative for  abdominal distention, blood in stool, constipation and diarrhea.  Genitourinary: Negative for difficulty urinating, discharge, flank pain, genital sores, hematuria and urgency.  Musculoskeletal: Negative for arthralgias, back pain, gait problem, joint swelling, myalgias and neck stiffness.  Skin: Negative for rash.  Neurological: Positive for weakness. Negative for dizziness, syncope, speech difficulty, numbness and headaches.  Hematological: Negative for adenopathy. Does not bruise/bleed easily.  Psychiatric/Behavioral:  Negative for behavioral problems and dysphoric mood. The patient is not nervous/anxious.        Objective:   Physical Exam  Constitutional: He is oriented to person, place, and time. He appears well-developed. No distress.  Temperature 9 9.5, pulse 80 Blood pressure 144/90  Appears slightly unwell but in no distress   HENT:  Head: Normocephalic.  Right Ear: External ear normal.  Left Ear: External ear normal.  Mouth/Throat: Oropharynx is clear and moist.  Eyes: Conjunctivae and EOM are normal.  Neck: Normal range of motion.  Cardiovascular: Normal rate and normal heart sounds.   Pulmonary/Chest: Breath sounds normal.  Abdominal: Soft. Bowel sounds are normal. There is tenderness.  Very mild.  Umbilical tenderness.  Bowel sounds quite active.  No guarding.  No left-sided tenderness  Musculoskeletal: Normal range of motion. He exhibits no edema or tenderness.  Surgical incision right knee healing well No signs of active infection  Neurological: He is alert and oriented to person, place, and time.  Psychiatric: He has a normal mood and affect. His behavior is normal.          Assessment & Plan:   Nausea, vomiting, improved.  Suspect more of a viral gastroenteritis.  Patient has done well off antibiotics for 4 days.  Will not resume Diverticulosis Stress hyperglycemia.  Indications discussed at length.  Patient has a follow-up visit in February.  We'll check  hemoglobin A1c at that time Status post right total knee replacement  Nyoka Cowden

## 2016-01-06 NOTE — Progress Notes (Signed)
Pre visit review using our clinic review tool, if applicable. No additional management support is needed unless otherwise documented below in the visit note. 

## 2016-01-06 NOTE — Patient Instructions (Signed)
Drink as much fluid as you  can tolerate over the next few days.  Advance diet as tolerated  Call or return to clinic prn if these symptoms worsen or fail to improve as anticipated.

## 2016-02-10 ENCOUNTER — Telehealth: Payer: Self-pay

## 2016-02-10 MED ORDER — TRAMADOL HCL 50 MG PO TABS: 50.0000 mg | ORAL_TABLET | Freq: Four times a day (QID) | ORAL | 2 refills | Status: DC | PRN

## 2016-02-10 NOTE — Telephone Encounter (Signed)
Medication Request: Tramadol 50mg   Last refill: 06-23-2015 #90 Last OV: 01-06-2016 Please advise on refill

## 2016-02-10 NOTE — Telephone Encounter (Signed)
Rx called in to pharmacy. 

## 2016-02-10 NOTE — Telephone Encounter (Signed)
Error

## 2016-03-10 DIAGNOSIS — M545 Low back pain: Secondary | ICD-10-CM | POA: Diagnosis not present

## 2016-03-10 DIAGNOSIS — I10 Essential (primary) hypertension: Secondary | ICD-10-CM | POA: Diagnosis not present

## 2016-03-10 DIAGNOSIS — Z6835 Body mass index (BMI) 35.0-35.9, adult: Secondary | ICD-10-CM | POA: Diagnosis not present

## 2016-03-10 DIAGNOSIS — M5137 Other intervertebral disc degeneration, lumbosacral region: Secondary | ICD-10-CM | POA: Diagnosis not present

## 2016-03-10 DIAGNOSIS — M5416 Radiculopathy, lumbar region: Secondary | ICD-10-CM | POA: Diagnosis not present

## 2016-03-10 DIAGNOSIS — M4316 Spondylolisthesis, lumbar region: Secondary | ICD-10-CM | POA: Diagnosis not present

## 2016-04-06 DIAGNOSIS — M4316 Spondylolisthesis, lumbar region: Secondary | ICD-10-CM | POA: Diagnosis not present

## 2016-04-07 DIAGNOSIS — M5126 Other intervertebral disc displacement, lumbar region: Secondary | ICD-10-CM | POA: Diagnosis not present

## 2016-04-07 DIAGNOSIS — M5416 Radiculopathy, lumbar region: Secondary | ICD-10-CM | POA: Diagnosis not present

## 2016-04-13 ENCOUNTER — Encounter (HOSPITAL_COMMUNITY): Payer: Self-pay | Admitting: Orthopedic Surgery

## 2016-04-21 ENCOUNTER — Ambulatory Visit: Payer: Medicare Other | Admitting: Internal Medicine

## 2016-05-16 DIAGNOSIS — Z6835 Body mass index (BMI) 35.0-35.9, adult: Secondary | ICD-10-CM | POA: Diagnosis not present

## 2016-05-16 DIAGNOSIS — M48061 Spinal stenosis, lumbar region without neurogenic claudication: Secondary | ICD-10-CM | POA: Diagnosis not present

## 2016-05-16 DIAGNOSIS — I10 Essential (primary) hypertension: Secondary | ICD-10-CM | POA: Diagnosis not present

## 2016-05-16 DIAGNOSIS — R030 Elevated blood-pressure reading, without diagnosis of hypertension: Secondary | ICD-10-CM | POA: Diagnosis not present

## 2016-05-19 ENCOUNTER — Other Ambulatory Visit: Payer: Self-pay | Admitting: Neurosurgery

## 2016-05-25 ENCOUNTER — Other Ambulatory Visit: Payer: Self-pay | Admitting: Internal Medicine

## 2016-05-25 MED ORDER — PANTOPRAZOLE SODIUM 40 MG PO TBEC: 40.0000 mg | DELAYED_RELEASE_TABLET | Freq: Every day | ORAL | 3 refills | Status: DC

## 2016-06-27 ENCOUNTER — Encounter (HOSPITAL_COMMUNITY): Payer: Self-pay

## 2016-06-27 ENCOUNTER — Encounter (HOSPITAL_COMMUNITY)
Admission: RE | Admit: 2016-06-27 | Discharge: 2016-06-27 | Disposition: A | Discharge status: Home / Self Care | Payer: Medicare Other | Source: Ambulatory Visit | Attending: Neurosurgery | Admitting: Neurosurgery

## 2016-06-27 HISTORY — DX: Peripheral vascular disease, unspecified: I73.9

## 2016-06-27 LAB — BASIC METABOLIC PANEL
ANION GAP: 9 (ref 5–15)
BUN: 13 mg/dL (ref 6–20)
CALCIUM: 9.7 mg/dL (ref 8.9–10.3)
CO2: 25 mmol/L (ref 22–32)
Chloride: 103 mmol/L (ref 101–111)
Creatinine, Ser: 0.89 mg/dL (ref 0.61–1.24)
GLUCOSE: 107 mg/dL — AB (ref 65–99)
POTASSIUM: 3.8 mmol/L (ref 3.5–5.1)
Sodium: 137 mmol/L (ref 135–145)

## 2016-06-27 LAB — TYPE AND SCREEN
ABO/RH(D): A POS
ANTIBODY SCREEN: NEGATIVE

## 2016-06-27 LAB — CBC
HEMATOCRIT: 45.8 % (ref 39.0–52.0)
HEMOGLOBIN: 15.7 g/dL (ref 13.0–17.0)
MCH: 31.5 pg (ref 26.0–34.0)
MCHC: 34.3 g/dL (ref 30.0–36.0)
MCV: 91.8 fL (ref 78.0–100.0)
Platelets: 267 10*3/uL (ref 150–400)
RBC: 4.99 MIL/uL (ref 4.22–5.81)
RDW: 14.2 % (ref 11.5–15.5)
WBC: 9.3 10*3/uL (ref 4.0–10.5)

## 2016-06-27 LAB — SURGICAL PCR SCREEN
MRSA, PCR: NEGATIVE
Staphylococcus aureus: NEGATIVE

## 2016-06-27 NOTE — Pre-Procedure Instructions (Addendum)
SEATON HOFMANN  06/27/2016      [facility] - [place] - [address] [address] [address] [place] [address] Phone: [phone] Fax: [phone]    Your procedure is scheduled on 07/28/16  Report to [facility] Admitting at 1015 A.M.  Call this number if you have problems the morning of surgery:  [phone]   Remember:  Do not eat food or drink liquids after midnight.  Take these medicines the morning of surgery with A SIP OF WATER      Levothyroxine,pantoprazole(protonix), tramadol  STOP all herbel meds, nsaids (aleve,naproxen,advil,ibuprofen)   prior to surgery starting 4/Tomorrow (06/29/16) including all vitamins/supplements,aspirin.aspercreme   Do not wear jewelry, make-up or nail polish.  Do not wear lotions, powders, or perfumes, or deoderant.  Do not shave 48 hours prior to surgery.  Men may shave face and neck.  Do not bring valuables to the hospital.  [facility] is not responsible for any belongings or valuables.  Contacts, dentures or bridgework may not be worn into surgery.  Leave your suitcase in the car.  After surgery it may be brought to your room.  For patients admitted to the hospital, discharge time will be determined by your treatment team.  Patients discharged the day of surgery will not be allowed to drive home.   Special instructions:   Special Instructions: [place] - Preparing for Surgery  Before surgery, you can play an important role.  Because skin is not sterile, your skin needs to be as free of germs as possible.  You can reduce the number of germs on you skin by washing with CHG (chlorahexidine gluconate) soap before surgery.  CHG is an antiseptic cleaner which kills germs and bonds with the skin to continue killing germs even after washing.  Please DO NOT use if you have an allergy to CHG or antibacterial soaps.  If your skin becomes reddened/irritated stop using the CHG and  inform your nurse when you arrive at Short Stay.  Do not shave (including legs and underarms) for at least 48 hours prior to the first CHG shower.  You may shave your face.  Please follow these instructions carefully:   1.  Shower with CHG Soap the night before surgery and the morning of Surgery.  2.  If you choose to wash your hair, wash your hair first as usual with your normal shampoo.  3.  After you shampoo, rinse your hair and body thoroughly to remove the Shampoo.  4.  Use CHG as you would any other liquid soap.  You can apply chg directly  to the skin and wash gently with scrungie or a clean washcloth.  5.  Apply the CHG Soap to your body ONLY FROM THE NECK DOWN.  Do not use on open wounds or open sores.  Avoid contact with your eyes ears, mouth and genitals (private parts).  Wash genitals (private parts)       with your normal soap.  6.  Wash thoroughly, paying special attention to the area where your surgery will be performed.  7.  Thoroughly rinse your body with warm water from the neck down.  8.  DO NOT shower/wash with your normal soap after using and rinsing off the CHG Soap.  9.  Pat yourself dry with a clean towel.            10.  Wear clean pajamas.  11.  Place clean sheets on your bed the night of your first shower and do not sleep with pets.  Day of Surgery  Do not apply any lotions/deodorants the morning of surgery.  Please wear clean clothes to the hospital/surgery center.  Please read over the  fact sheets that you were given.

## 2016-06-28 ENCOUNTER — Other Ambulatory Visit (HOSPITAL_COMMUNITY): Payer: Self-pay | Admitting: *Deleted

## 2016-06-28 ENCOUNTER — Inpatient Hospital Stay (HOSPITAL_COMMUNITY): Payer: Medicare Other | Admitting: Certified Registered Nurse Anesthetist

## 2016-06-28 ENCOUNTER — Encounter (HOSPITAL_COMMUNITY): Payer: Self-pay | Admitting: Anesthesiology

## 2016-06-28 ENCOUNTER — Inpatient Hospital Stay (HOSPITAL_COMMUNITY): Payer: Medicare Other

## 2016-06-28 ENCOUNTER — Inpatient Hospital Stay (HOSPITAL_COMMUNITY)
Admission: RE | Disposition: A | Discharge status: Home / Self Care | Payer: Self-pay | Source: Ambulatory Visit | Attending: Neurosurgery

## 2016-06-28 ENCOUNTER — Inpatient Hospital Stay (HOSPITAL_COMMUNITY)
Admission: RE | Admit: 2016-06-28 | Discharge: 2016-06-30 | DRG: 455 | Disposition: A | Discharge status: Home / Self Care | Payer: Medicare Other | Source: Ambulatory Visit | Attending: Neurosurgery | Admitting: Neurosurgery

## 2016-06-28 DIAGNOSIS — E785 Hyperlipidemia, unspecified: Secondary | ICD-10-CM | POA: Diagnosis present

## 2016-06-28 DIAGNOSIS — I739 Peripheral vascular disease, unspecified: Secondary | ICD-10-CM | POA: Diagnosis present

## 2016-06-28 DIAGNOSIS — Z6834 Body mass index (BMI) 34.0-34.9, adult: Secondary | ICD-10-CM | POA: Diagnosis not present

## 2016-06-28 DIAGNOSIS — Z7982 Long term (current) use of aspirin: Secondary | ICD-10-CM | POA: Diagnosis not present

## 2016-06-28 DIAGNOSIS — E039 Hypothyroidism, unspecified: Secondary | ICD-10-CM | POA: Diagnosis present

## 2016-06-28 DIAGNOSIS — M5136 Other intervertebral disc degeneration, lumbar region: Secondary | ICD-10-CM | POA: Diagnosis not present

## 2016-06-28 DIAGNOSIS — E669 Obesity, unspecified: Secondary | ICD-10-CM | POA: Diagnosis present

## 2016-06-28 DIAGNOSIS — Z720 Tobacco use: Secondary | ICD-10-CM

## 2016-06-28 DIAGNOSIS — Z96653 Presence of artificial knee joint, bilateral: Secondary | ICD-10-CM | POA: Diagnosis present

## 2016-06-28 DIAGNOSIS — K219 Gastro-esophageal reflux disease without esophagitis: Secondary | ICD-10-CM | POA: Diagnosis present

## 2016-06-28 DIAGNOSIS — M4316 Spondylolisthesis, lumbar region: Secondary | ICD-10-CM | POA: Diagnosis not present

## 2016-06-28 DIAGNOSIS — Z8546 Personal history of malignant neoplasm of prostate: Secondary | ICD-10-CM

## 2016-06-28 DIAGNOSIS — M545 Low back pain: Secondary | ICD-10-CM | POA: Diagnosis not present

## 2016-06-28 DIAGNOSIS — Z79899 Other long term (current) drug therapy: Secondary | ICD-10-CM | POA: Diagnosis not present

## 2016-06-28 DIAGNOSIS — Z981 Arthrodesis status: Secondary | ICD-10-CM | POA: Diagnosis not present

## 2016-06-28 DIAGNOSIS — M48062 Spinal stenosis, lumbar region with neurogenic claudication: Secondary | ICD-10-CM | POA: Diagnosis present

## 2016-06-28 DIAGNOSIS — I10 Essential (primary) hypertension: Secondary | ICD-10-CM | POA: Diagnosis present

## 2016-06-28 DIAGNOSIS — F329 Major depressive disorder, single episode, unspecified: Secondary | ICD-10-CM | POA: Diagnosis present

## 2016-06-28 DIAGNOSIS — M5116 Intervertebral disc disorders with radiculopathy, lumbar region: Secondary | ICD-10-CM | POA: Diagnosis present

## 2016-06-28 DIAGNOSIS — Z419 Encounter for procedure for purposes other than remedying health state, unspecified: Secondary | ICD-10-CM

## 2016-06-28 DIAGNOSIS — Z8673 Personal history of transient ischemic attack (TIA), and cerebral infarction without residual deficits: Secondary | ICD-10-CM

## 2016-06-28 DIAGNOSIS — G459 Transient cerebral ischemic attack, unspecified: Secondary | ICD-10-CM | POA: Diagnosis not present

## 2016-06-28 SURGERY — POSTERIOR LUMBAR FUSION 1 LEVEL
Anesthesia: General | Site: Spine Lumbar

## 2016-06-28 MED ORDER — PROMETHAZINE HCL 25 MG PO TABS
25.0000 mg | ORAL_TABLET | Freq: Four times a day (QID) | ORAL | Status: DC | PRN
  Administered 2016-06-29: 25 mg via ORAL
  Filled 2016-06-28: qty 1

## 2016-06-28 MED ORDER — SODIUM CHLORIDE 0.9% FLUSH: 3.0000 mL | Freq: Two times a day (BID) | INTRAVENOUS | Status: DC

## 2016-06-28 MED ORDER — CHLORHEXIDINE GLUCONATE CLOTH 2 % EX PADS: 6.0000 | MEDICATED_PAD | Freq: Once | CUTANEOUS | Status: DC

## 2016-06-28 MED ORDER — FENTANYL CITRATE (PF) 250 MCG/5ML IJ SOLN
INTRAMUSCULAR | Status: AC
  Filled 2016-06-28: qty 5

## 2016-06-28 MED ORDER — PAROXETINE HCL 20 MG PO TABS
20.0000 mg | ORAL_TABLET | Freq: Every evening | ORAL | Status: DC
  Administered 2016-06-28 – 2016-06-29 (×2): 20 mg via ORAL
  Filled 2016-06-28 (×3): qty 1

## 2016-06-28 MED ORDER — OXYCODONE HCL 5 MG PO TABS
5.0000 mg | ORAL_TABLET | ORAL | Status: DC | PRN
  Administered 2016-06-28 – 2016-06-30 (×7): 10 mg via ORAL
  Filled 2016-06-28 (×7): qty 2

## 2016-06-28 MED ORDER — BISACODYL 10 MG RE SUPP: 10.0000 mg | Freq: Every day | RECTAL | Status: DC | PRN

## 2016-06-28 MED ORDER — PHENOL 1.4 % MT LIQD: 1.0000 | OROMUCOSAL | Status: DC | PRN

## 2016-06-28 MED ORDER — THROMBIN 20000 UNITS EX KIT: PACK | CUTANEOUS | Status: DC | PRN

## 2016-06-28 MED ORDER — PROPOFOL 10 MG/ML IV BOLUS
INTRAVENOUS | Status: DC | PRN
  Administered 2016-06-28 (×2): 100 mg via INTRAVENOUS

## 2016-06-28 MED ORDER — ADULT MULTIVITAMIN W/MINERALS CH
1.0000 | ORAL_TABLET | Freq: Every day | ORAL | Status: DC
  Administered 2016-06-29 – 2016-06-30 (×2): 1 via ORAL
  Filled 2016-06-28 (×2): qty 1

## 2016-06-28 MED ORDER — BACITRACIN ZINC 500 UNIT/GM EX OINT
TOPICAL_OINTMENT | CUTANEOUS | Status: AC
  Filled 2016-06-28: qty 28.35

## 2016-06-28 MED ORDER — ATORVASTATIN CALCIUM 20 MG PO TABS
40.0000 mg | ORAL_TABLET | Freq: Every day | ORAL | Status: DC
  Administered 2016-06-28 – 2016-06-29 (×2): 40 mg via ORAL
  Filled 2016-06-28 (×2): qty 2

## 2016-06-28 MED ORDER — VANCOMYCIN HCL 1000 MG IV SOLR
INTRAVENOUS | Status: AC
  Filled 2016-06-28: qty 1000

## 2016-06-28 MED ORDER — THROMBIN 5000 UNITS EX SOLR
OROMUCOSAL | Status: DC | PRN
  Administered 2016-06-28: 5 mL via TOPICAL

## 2016-06-28 MED ORDER — NEOSTIGMINE METHYLSULFATE 10 MG/10ML IV SOLN
INTRAVENOUS | Status: DC | PRN
  Administered 2016-06-28: 5 mg via INTRAVENOUS

## 2016-06-28 MED ORDER — ONE-DAILY MULTI VITAMINS PO TABS: 1.0000 | ORAL_TABLET | Freq: Every day | ORAL | Status: DC

## 2016-06-28 MED ORDER — BACITRACIN ZINC 500 UNIT/GM EX OINT
TOPICAL_OINTMENT | CUTANEOUS | Status: DC | PRN
  Administered 2016-06-28: 1 via TOPICAL

## 2016-06-28 MED ORDER — VANCOMYCIN HCL 1000 MG IV SOLR
INTRAVENOUS | Status: DC | PRN
  Administered 2016-06-28: 1000 mg via TOPICAL

## 2016-06-28 MED ORDER — ONDANSETRON HCL 4 MG/2ML IJ SOLN: 4.0000 mg | Freq: Four times a day (QID) | INTRAMUSCULAR | Status: DC | PRN

## 2016-06-28 MED ORDER — ONDANSETRON HCL 4 MG PO TABS: 4.0000 mg | ORAL_TABLET | Freq: Four times a day (QID) | ORAL | Status: DC | PRN

## 2016-06-28 MED ORDER — EPHEDRINE SULFATE 50 MG/ML IJ SOLN
INTRAMUSCULAR | Status: DC | PRN
  Administered 2016-06-28 (×3): 10 mg via INTRAVENOUS

## 2016-06-28 MED ORDER — LEVOTHYROXINE SODIUM 88 MCG PO TABS
88.0000 ug | ORAL_TABLET | Freq: Every day | ORAL | Status: DC
  Administered 2016-06-29 – 2016-06-30 (×2): 88 ug via ORAL
  Filled 2016-06-28 (×2): qty 1

## 2016-06-28 MED ORDER — HYDROMORPHONE HCL 1 MG/ML IJ SOLN
INTRAMUSCULAR | Status: AC
  Filled 2016-06-28: qty 1

## 2016-06-28 MED ORDER — THROMBIN 5000 UNITS EX SOLR
CUTANEOUS | Status: AC
  Filled 2016-06-28: qty 5000

## 2016-06-28 MED ORDER — SODIUM CHLORIDE 0.9% FLUSH: 3.0000 mL | INTRAVENOUS | Status: DC | PRN

## 2016-06-28 MED ORDER — MORPHINE SULFATE (PF) 4 MG/ML IV SOLN
4.0000 mg | INTRAVENOUS | Status: DC | PRN
  Administered 2016-06-28 (×2): 4 mg via INTRAVENOUS
  Filled 2016-06-28 (×2): qty 1

## 2016-06-28 MED ORDER — MENTHOL 3 MG MT LOZG: 1.0000 | LOZENGE | OROMUCOSAL | Status: DC | PRN

## 2016-06-28 MED ORDER — SODIUM CHLORIDE 0.9 % IV SOLN: 250.0000 mL | INTRAVENOUS | Status: DC

## 2016-06-28 MED ORDER — FENTANYL CITRATE (PF) 250 MCG/5ML IJ SOLN
INTRAMUSCULAR | Status: DC | PRN
  Administered 2016-06-28 (×6): 50 ug via INTRAVENOUS
  Administered 2016-06-28: 100 ug via INTRAVENOUS
  Administered 2016-06-28 (×2): 50 ug via INTRAVENOUS

## 2016-06-28 MED ORDER — BACITRACIN 50000 UNITS IM SOLR
INTRAMUSCULAR | Status: DC | PRN
  Administered 2016-06-28: 500 mL

## 2016-06-28 MED ORDER — MIDAZOLAM HCL 2 MG/2ML IJ SOLN
INTRAMUSCULAR | Status: AC
  Filled 2016-06-28: qty 2

## 2016-06-28 MED ORDER — HYDROMORPHONE HCL 1 MG/ML IJ SOLN
0.2500 mg | INTRAMUSCULAR | Status: DC | PRN
  Administered 2016-06-28 (×2): 0.5 mg via INTRAVENOUS

## 2016-06-28 MED ORDER — CEFAZOLIN SODIUM-DEXTROSE 2-4 GM/100ML-% IV SOLN
2.0000 g | Freq: Three times a day (TID) | INTRAVENOUS | Status: AC
  Administered 2016-06-28 – 2016-06-29 (×2): 2 g via INTRAVENOUS
  Filled 2016-06-28: qty 100

## 2016-06-28 MED ORDER — 0.9 % SODIUM CHLORIDE (POUR BTL) OPTIME
TOPICAL | Status: DC | PRN
  Administered 2016-06-28: 1000 mL

## 2016-06-28 MED ORDER — LISINOPRIL-HYDROCHLOROTHIAZIDE 20-12.5 MG PO TABS: 1.0000 | ORAL_TABLET | Freq: Every day | ORAL | Status: DC

## 2016-06-28 MED ORDER — LACTATED RINGERS IV SOLN
INTRAVENOUS | Status: DC
  Administered 2016-06-28 (×3): via INTRAVENOUS

## 2016-06-28 MED ORDER — LIDOCAINE HCL (CARDIAC) 20 MG/ML IV SOLN
INTRAVENOUS | Status: DC | PRN
  Administered 2016-06-28: 100 mg via INTRATRACHEAL

## 2016-06-28 MED ORDER — ACETAMINOPHEN 325 MG PO TABS
650.0000 mg | ORAL_TABLET | ORAL | Status: DC | PRN
  Administered 2016-06-29 – 2016-06-30 (×2): 650 mg via ORAL
  Filled 2016-06-28 (×2): qty 2

## 2016-06-28 MED ORDER — DOCUSATE SODIUM 100 MG PO CAPS
100.0000 mg | ORAL_CAPSULE | Freq: Two times a day (BID) | ORAL | Status: DC
  Administered 2016-06-28 – 2016-06-30 (×4): 100 mg via ORAL
  Filled 2016-06-28 (×4): qty 1

## 2016-06-28 MED ORDER — PROPOFOL 10 MG/ML IV BOLUS
INTRAVENOUS | Status: AC
  Filled 2016-06-28: qty 20

## 2016-06-28 MED ORDER — MEPERIDINE HCL 25 MG/ML IJ SOLN: 6.2500 mg | INTRAMUSCULAR | Status: DC | PRN

## 2016-06-28 MED ORDER — ROCURONIUM BROMIDE 100 MG/10ML IV SOLN
INTRAVENOUS | Status: DC | PRN
  Administered 2016-06-28: 50 mg via INTRAVENOUS
  Administered 2016-06-28: 20 mg via INTRAVENOUS

## 2016-06-28 MED ORDER — GLYCOPYRROLATE 0.2 MG/ML IJ SOLN
INTRAMUSCULAR | Status: DC | PRN
  Administered 2016-06-28: .8 mg via INTRAVENOUS

## 2016-06-28 MED ORDER — CEFAZOLIN SODIUM-DEXTROSE 2-4 GM/100ML-% IV SOLN
2.0000 g | INTRAVENOUS | Status: AC
  Administered 2016-06-28: 2 g via INTRAVENOUS

## 2016-06-28 MED ORDER — NEOSTIGMINE METHYLSULFATE 5 MG/5ML IV SOSY
PREFILLED_SYRINGE | INTRAVENOUS | Status: AC
  Filled 2016-06-28: qty 5

## 2016-06-28 MED ORDER — LORATADINE 10 MG PO TABS
10.0000 mg | ORAL_TABLET | Freq: Every day | ORAL | Status: DC
  Administered 2016-06-29 – 2016-06-30 (×2): 10 mg via ORAL
  Filled 2016-06-28 (×3): qty 1

## 2016-06-28 MED ORDER — TRAMADOL HCL 50 MG PO TABS: 100.0000 mg | ORAL_TABLET | Freq: Four times a day (QID) | ORAL | Status: DC | PRN

## 2016-06-28 MED ORDER — PROMETHAZINE HCL 25 MG/ML IJ SOLN: 6.2500 mg | INTRAMUSCULAR | Status: DC | PRN

## 2016-06-28 MED ORDER — MIDAZOLAM HCL 2 MG/2ML IJ SOLN
INTRAMUSCULAR | Status: DC | PRN
  Administered 2016-06-28: 2 mg via INTRAVENOUS

## 2016-06-28 MED ORDER — PANTOPRAZOLE SODIUM 40 MG PO TBEC
40.0000 mg | DELAYED_RELEASE_TABLET | Freq: Every day | ORAL | Status: DC
  Administered 2016-06-28 – 2016-06-30 (×3): 40 mg via ORAL
  Filled 2016-06-28 (×3): qty 1

## 2016-06-28 MED ORDER — LISINOPRIL 20 MG PO TABS
20.0000 mg | ORAL_TABLET | Freq: Every day | ORAL | Status: DC
  Administered 2016-06-28 – 2016-06-29 (×2): 20 mg via ORAL
  Filled 2016-06-28 (×2): qty 1

## 2016-06-28 MED ORDER — LIDOCAINE-EPINEPHRINE 1 %-1:100000 IJ SOLN
INTRAMUSCULAR | Status: AC
  Filled 2016-06-28: qty 1

## 2016-06-28 MED ORDER — THROMBIN 20000 UNITS EX SOLR
CUTANEOUS | Status: AC
  Filled 2016-06-28: qty 20000

## 2016-06-28 MED ORDER — HYDROCHLOROTHIAZIDE 12.5 MG PO CAPS
12.5000 mg | ORAL_CAPSULE | Freq: Every day | ORAL | Status: DC
  Administered 2016-06-28 – 2016-06-29 (×2): 12.5 mg via ORAL
  Filled 2016-06-28 (×2): qty 1

## 2016-06-28 MED ORDER — THROMBIN 20000 UNITS EX SOLR
CUTANEOUS | Status: DC | PRN
  Administered 2016-06-28: 20 mL via TOPICAL

## 2016-06-28 MED ORDER — LIDOCAINE-EPINEPHRINE 1 %-1:100000 IJ SOLN
INTRAMUSCULAR | Status: DC | PRN
  Administered 2016-06-28: 10 mL

## 2016-06-28 MED ORDER — CYCLOBENZAPRINE HCL 10 MG PO TABS
10.0000 mg | ORAL_TABLET | Freq: Three times a day (TID) | ORAL | Status: DC | PRN
  Administered 2016-06-28 – 2016-06-30 (×4): 10 mg via ORAL
  Filled 2016-06-28 (×4): qty 1

## 2016-06-28 MED ORDER — SUGAMMADEX SODIUM 200 MG/2ML IV SOLN
INTRAVENOUS | Status: AC
  Filled 2016-06-28: qty 2

## 2016-06-28 MED ORDER — THROMBIN 5000 UNITS EX SOLR: CUTANEOUS | Status: DC | PRN

## 2016-06-28 MED ORDER — BUPIVACAINE LIPOSOME 1.3 % IJ SUSP
20.0000 mL | INTRAMUSCULAR | Status: AC
  Administered 2016-06-28: 20 mL
  Filled 2016-06-28: qty 20

## 2016-06-28 MED ORDER — KETOROLAC TROMETHAMINE 30 MG/ML IJ SOLN: 30.0000 mg | Freq: Once | INTRAMUSCULAR | Status: DC | PRN

## 2016-06-28 MED ORDER — ACETAMINOPHEN 650 MG RE SUPP: 650.0000 mg | RECTAL | Status: DC | PRN

## 2016-06-28 SURGICAL SUPPLY — 67 items
BAG DECANTER FOR FLEXI CONT (MISCELLANEOUS) ×2 IMPLANT
BENZOIN TINCTURE PRP APPL 2/3 (GAUZE/BANDAGES/DRESSINGS) ×2 IMPLANT
BLADE CLIPPER SURG (BLADE) ×2 IMPLANT
BUR MATCHSTICK NEURO 3.0 LAGG (BURR) ×2 IMPLANT
BUR PRECISION FLUTE 6.0 (BURR) ×2 IMPLANT
CAGE ALTERA 10X31X10-14 15D (Cage) ×2 IMPLANT
CANISTER SUCT 3000ML PPV (MISCELLANEOUS) ×2 IMPLANT
CAP REVERE LOCKING (Cap) ×16 IMPLANT
CARTRIDGE OIL MAESTRO DRILL (MISCELLANEOUS) ×1 IMPLANT
CONT SPEC 4OZ CLIKSEAL STRL BL (MISCELLANEOUS) ×4 IMPLANT
COVER BACK TABLE 60X90IN (DRAPES) ×2 IMPLANT
DIFFUSER DRILL AIR PNEUMATIC (MISCELLANEOUS) ×2 IMPLANT
DRAPE C-ARM 42X72 X-RAY (DRAPES) ×4 IMPLANT
DRAPE HALF SHEET 40X57 (DRAPES) IMPLANT
DRAPE LAPAROTOMY 100X72X124 (DRAPES) ×2 IMPLANT
DRAPE POUCH INSTRU U-SHP 10X18 (DRAPES) ×2 IMPLANT
DRAPE SURG 17X23 STRL (DRAPES) ×8 IMPLANT
ELECT BLADE 4.0 EZ CLEAN MEGAD (MISCELLANEOUS) ×2
ELECT REM PT RETURN 9FT ADLT (ELECTROSURGICAL) ×2
ELECTRODE BLDE 4.0 EZ CLN MEGD (MISCELLANEOUS) ×1 IMPLANT
ELECTRODE REM PT RTRN 9FT ADLT (ELECTROSURGICAL) ×1 IMPLANT
EVACUATOR 1/8 PVC DRAIN (DRAIN) IMPLANT
GAUZE SPONGE 4X4 12PLY STRL (GAUZE/BANDAGES/DRESSINGS) ×2 IMPLANT
GAUZE SPONGE 4X4 12PLY STRL LF (GAUZE/BANDAGES/DRESSINGS) ×2 IMPLANT
GAUZE SPONGE 4X4 16PLY XRAY LF (GAUZE/BANDAGES/DRESSINGS) IMPLANT
GLOVE BIO SURGEON STRL SZ8 (GLOVE) ×4 IMPLANT
GLOVE BIO SURGEON STRL SZ8.5 (GLOVE) ×4 IMPLANT
GLOVE BIOGEL PI IND STRL 6.5 (GLOVE) ×2 IMPLANT
GLOVE BIOGEL PI IND STRL 7.0 (GLOVE) ×1 IMPLANT
GLOVE BIOGEL PI IND STRL 7.5 (GLOVE) ×1 IMPLANT
GLOVE BIOGEL PI INDICATOR 6.5 (GLOVE) ×2
GLOVE BIOGEL PI INDICATOR 7.0 (GLOVE) ×1
GLOVE BIOGEL PI INDICATOR 7.5 (GLOVE) ×1
GLOVE EXAM NITRILE LRG STRL (GLOVE) IMPLANT
GLOVE EXAM NITRILE XL STR (GLOVE) IMPLANT
GLOVE EXAM NITRILE XS STR PU (GLOVE) IMPLANT
GLOVE SURG SS PI 6.5 STRL IVOR (GLOVE) ×6 IMPLANT
GLOVE SURG SS PI 7.0 STRL IVOR (GLOVE) ×2 IMPLANT
GOWN STRL REUS W/ TWL LRG LVL3 (GOWN DISPOSABLE) ×2 IMPLANT
GOWN STRL REUS W/ TWL XL LVL3 (GOWN DISPOSABLE) ×3 IMPLANT
GOWN STRL REUS W/TWL 2XL LVL3 (GOWN DISPOSABLE) IMPLANT
GOWN STRL REUS W/TWL LRG LVL3 (GOWN DISPOSABLE) ×2
GOWN STRL REUS W/TWL XL LVL3 (GOWN DISPOSABLE) ×3
KIT BASIN OR (CUSTOM PROCEDURE TRAY) ×2 IMPLANT
KIT ROOM TURNOVER OR (KITS) ×2 IMPLANT
NEEDLE HYPO 21X1.5 SAFETY (NEEDLE) IMPLANT
NEEDLE HYPO 22GX1.5 SAFETY (NEEDLE) ×2 IMPLANT
NS IRRIG 1000ML POUR BTL (IV SOLUTION) ×2 IMPLANT
OIL CARTRIDGE MAESTRO DRILL (MISCELLANEOUS) ×2
PACK LAMINECTOMY NEURO (CUSTOM PROCEDURE TRAY) ×2 IMPLANT
PAD ARMBOARD 7.5X6 YLW CONV (MISCELLANEOUS) ×6 IMPLANT
PATTIES SURGICAL .5 X1 (DISPOSABLE) IMPLANT
ROD CURVED REVERE 6.35 95MM (Rod) ×4 IMPLANT
SCREW REVERE 6.35 75X55MM (Screw) ×4 IMPLANT
SPONGE LAP 4X18 X RAY DECT (DISPOSABLE) IMPLANT
SPONGE NEURO XRAY DETECT 1X3 (DISPOSABLE) IMPLANT
SPONGE SURGIFOAM ABS GEL 100 (HEMOSTASIS) ×2 IMPLANT
STRIP BIOACTIVE 20CC 25X100X8 (Miscellaneous) ×2 IMPLANT
STRIP CLOSURE SKIN 1/2X4 (GAUZE/BANDAGES/DRESSINGS) ×2 IMPLANT
SUT VIC AB 1 CT1 18XBRD ANBCTR (SUTURE) ×2 IMPLANT
SUT VIC AB 1 CT1 8-18 (SUTURE) ×2
SUT VIC AB 2-0 CP2 18 (SUTURE) ×4 IMPLANT
TAPE CLOTH SURG 4X10 WHT LF (GAUZE/BANDAGES/DRESSINGS) ×2 IMPLANT
TOWEL GREEN STERILE (TOWEL DISPOSABLE) ×2 IMPLANT
TOWEL GREEN STERILE FF (TOWEL DISPOSABLE) ×2 IMPLANT
TRAY FOLEY W/METER SILVER 16FR (SET/KITS/TRAYS/PACK) ×2 IMPLANT
WATER STERILE IRR 1000ML POUR (IV SOLUTION) ×2 IMPLANT

## 2016-06-28 NOTE — Anesthesia Preprocedure Evaluation (Addendum)
Anesthesia Evaluation  Patient identified by MRN, date of birth, ID band Patient awake    Reviewed: Allergy & Precautions, NPO status , Patient's Chart, lab work & pertinent test results  Airway Mallampati: II  TM Distance: >3 FB Neck ROM: Full    Dental no notable dental hx.    Pulmonary    Pulmonary exam normal breath sounds clear to auscultation       Cardiovascular hypertension, Pt. on medications Normal cardiovascular exam Rhythm:Regular Rate:Normal     Neuro/Psych PSYCHIATRIC DISORDERS Depression TIA   GI/Hepatic Neg liver ROS, GERD  Medicated and Controlled,  Endo/Other  Hypothyroidism   Renal/GU negative Renal ROS     Musculoskeletal  (+) Arthritis , Osteoarthritis,    Abdominal (+) + obese,   Peds  Hematology negative hematology ROS (+)   Anesthesia Other Findings   Reproductive/Obstetrics                             Lab Results  Component Value Date   WBC 9.3 06/27/2016   HGB 15.7 06/27/2016   HCT 45.8 06/27/2016   MCV 91.8 06/27/2016   PLT 267 06/27/2016   Lab Results  Component Value Date   CREATININE 0.89 06/27/2016   BUN 13 06/27/2016   NA 137 06/27/2016   K 3.8 06/27/2016   CL 103 06/27/2016   CO2 25 06/27/2016    Anesthesia Physical  Anesthesia Plan  ASA: II  Anesthesia Plan: General   Post-op Pain Management:  Regional for Post-op pain   Induction: Intravenous  Airway Management Planned: Oral ETT  Additional Equipment:   Intra-op Plan:   Post-operative Plan: Extubation in OR  Informed Consent: I have reviewed the patients History and Physical, chart, labs and discussed the procedure including the risks, benefits and alternatives for the proposed anesthesia with the patient or authorized representative who has indicated his/her understanding and acceptance.   Dental advisory given  Plan Discussed with: CRNA and Surgeon  Anesthesia Plan  Comments:         Anesthesia Quick Evaluation

## 2016-06-28 NOTE — Op Note (Signed)
Brief history: The patient is a 66 year old white male on whom I performed at L3-4 and L4-5 decompression, instrumentation, and fusion about 5 years ago. The patient did well but has developed recurrent back, buttock and leg pain consistent with neurogenic claudication. He has failed medical management. He was worked up with lumbar x-rays and a lumbar MRI which demonstrated severe spinal stenosis at L2-3. I discussed the situation with the patient. He has weighed the risks, benefits, and alternatives to surgery and decided proceed with an expiration of her lumbar fusion with an L2-3 decompression, instrumentation, and fusion.  Preoperative diagnosis: L2-3 Degenerative disc disease, spinal stenosis compressing both the L2 and the L3 nerve roots; lumbago; lumbar radiculopathy  Postoperative diagnosis: The same  Procedure: Bilateral L2-3 Laminotomy/foraminotomies to decompress the bilateral L2 and L3 nerve roots(the work required to do this was in addition to the work required to do the posterior lumbar interbody fusion because of the patient's spinal stenosis, facet arthropathy. Etc. requiring a wide decompression of the nerve roots.); L2-3 transforaminal lumbar interbody fusion with local morselized autograft bone and Kinnex graft extender; insertion of interbody prosthesis at L2-3 (globus peek expandable interbody prosthesis); posterior segmental instrumentation from L2 to L5 with globus titanium pedicle screws and rods; posterior lateral arthrodesis at L2-3 with local morselized autograft bone and Kinnex bone graft extender.  Surgeon: Dr. Earle Gell  Asst.: Dr. Sherley Bounds  Anesthesia: Gen. endotracheal  Estimated blood loss: 250 mL  Drains: None  Complications: None  Description of procedure: The patient was brought to the operating room by the anesthesia team. General endotracheal anesthesia was induced. The patient was turned to the prone position on the Wilson frame. The patient's  lumbosacral region was then prepared with Betadine scrub and Betadine solution. Sterile drapes were applied.  I then injected the area to be incised with Marcaine with epinephrine solution. I then used the scalpel to make a linear midline incision over the L2-3, L3-4 and L4-5 interspace. I then used electrocautery to perform a bilateral subperiosteal dissection exposing the spinous process and lamina of L2, L3, L4 and L5 and to expose the old hardware. We then inserted the Verstrac retractor to provide exposure.  We explored the fusion by removing the catheters from the old screws, removed the rods and attempting to independently move the screws. The arthrodesis at L3-4 and L4-5 appeared solid.  I began the decompression by using the high speed drill to perform laminotomies at L2-3 bilaterally. We then used the Kerrison punches to widen the laminotomy and removed the ligamentum flavum at L2-3 bilaterally. We used the Kerrison punches to remove the medial facets at L2-3 bilaterally. We performed wide foraminotomies about the bilateral L2 and L3 nerve roots completing the decompression.  We now turned our attention to the posterior lumbar interbody fusion. I used a scalpel to incise the intervertebral disc at L2-3 bilaterally. I then performed a partial intervertebral discectomy at L2-3 bilaterally using the pituitary forceps. We prepared the vertebral endplates at T0-1 bilaterally for the fusion by removing the soft tissues with the curettes. We then used the trial spacers to pick the appropriate sized interbody prosthesis. We prefilled his prosthesis with a combination of local morselized autograft bone that we obtained during the decompression as well as Kinnex bone graft extender. We inserted the prefilled prosthesis into the interspace at L2-3, we then expanded the prosthesis.. There was a good snug fit of the prosthesis in the interspace. We then filled and the remainder of the intervertebral  disc space  with local morselized autograft bone and Kinnex. This completed the posterior lumbar interbody arthrodesis.  We now turned attention to the instrumentation. Under fluoroscopic guidance we cannulated the bilateral L2 pedicles with the bone probe. We then removed the bone probe. We then tapped the pedicle with a 6.5 millimeter tap. We then removed the tap. We probed inside the tapped pedicle with a ball probe to rule out cortical breaches. We then inserted a 7.5 x 55 millimeter pedicle screw into the L2 pedicles bilaterally under fluoroscopic guidance. We then palpated along the medial aspect of the pedicles to rule out cortical breaches. There were none. The nerve roots were not injured. We then connected the unilateral pedicle screws with a lordotic rod. We compressed the construct and secured the rod in place with the caps. We then tightened the caps appropriately. This completed the instrumentation from L2-L5 bilaterally.  We now turned our attention to the posterior lateral arthrodesis at L2-3 bilaterally. We used the high-speed drill to decorticate the remainder of the facets, pars, transverse process at L2-3 bilaterally. We then applied a combination of local morselized autograft bone and Kinnex bone graft extender over these decorticated posterior lateral structures. This completed the posterior lateral arthrodesis.  We then obtained hemostasis using bipolar electrocautery. We irrigated the wound out with bacitracin solution. We inspected the thecal sac and nerve roots and noted they were well decompressed. We then removed the retractor. We placed vancomycin powder in the wound. We reapproximated patient's thoracolumbar fascia with interrupted #1 Vicryl suture. We reapproximated patient's subcutaneous tissue with interrupted 2-0 Vicryl suture. The reapproximated patient's skin with Steri-Strips and benzoin. The wound was then coated with bacitracin ointment. A sterile dressing was applied. The drapes  were removed. The patient was subsequently returned to the supine position where they were extubated by the anesthesia team. He was then transported to the post anesthesia care unit in stable condition. All sponge instrument and needle counts were reportedly correct at the end of this case.

## 2016-06-28 NOTE — Progress Notes (Signed)
Orthopedic Tech Progress Note Patient Details:  [name] [birth year] [id number] Patient has brace. Patient ID: [name], male   DOB: 1950/12/20, 66 y.o.   MRN: [id number]   Braulio Bosch 06/28/2016, 5:47 PM

## 2016-06-28 NOTE — Transfer of Care (Signed)
Immediate Anesthesia Transfer of Care Note  Patient: [name]  Procedure(s) Performed: Procedure(s): POSTERIOR LUMBAR INTERBODY FUSION, INTERBODY PROSTHESIS,POSTERIOR LATERAL ARTHRODESIS,POSTERIOR SGEMENTAL INSTRUMENTATION LUMBAR TWO- LUMBAR THREE (N/A)  Patient Location: PACU  Anesthesia Type:General  Level of Consciousness: awake, alert  and oriented  Airway & Oxygen Therapy: Patient Spontanous Breathing and Patient connected to nasal cannula oxygen  Post-op Assessment: Report given to RN and Post -op Vital signs reviewed and stable  Post vital signs: Reviewed and stable  Last Vitals:  Vitals:   06/28/16 1013  BP: 138/90  Pulse: 80  Resp: 20  Temp: 36.9 C    Last Pain:  Vitals:   06/28/16 1034  PainSc: 7       Patients Stated Pain Goal: 4 (88/41/66 0630)  Complications: No apparent anesthesia complications

## 2016-06-28 NOTE — H&P (Signed)
Subjective: The patient is a 66 year old white male on whom I previously performed and an L3-4 and L4-5 laminectomy instrumentation and fusion. The patient did well for years but has developed recurrent back, buttock and leg pain consistent with neurogenic claudication. He has failed medical management and was worked up with a lumbar MRI and lumbar x-rays. This demonstrated severe spinal stenosis at L2-3. I discussed the various treatment options with the patient including surgery. He has weighed the risks, benefits, and alternative surgery and decided proceed with a L2-3 decompression, instrumentation, and fusion.  Past Medical History:  Diagnosis Date  . Allergy   . Chronic back pain    DDD  . Depression    takes Paxil daily  . GERD (gastroesophageal reflux disease)    takes Protonix daily  . History of bronchitis    many yrs ago  . History of kidney stones   . Hx of colonic polyps   . Hyperlipidemia    takes Lipitor daily  . Hypertension    takes Lisinopril-HCTZ daily  . Hypothyroidism    takes Synthroid daily  . Insomnia 12-10-12   not a problem now  . Joint pain   . Nocturia   . Osteoarthritis   . Peripheral vascular disease ([place])    dvt 12?  Marland Kitchen Pneumonia 2012   hx of   . Prostate cancer ([place])   . Stroke ([place])    tia 10-66yrs ago  . TIA (transient ischemic attack)    takes ASA daily    Past Surgical History:  Procedure Laterality Date  . BACK SURGERY  12-10-12   '12-Lumbar fusion-retained hardware  . COLONOSCOPY    . ELBOW SURGERY Left   . HERNIA REPAIR Left    [facility]  . KNEE SURGERY Left    open surgery  . POLYPECTOMY    . PROSTATECTOMY    . TIBIA FRACTURE SURGERY    . TONSILLECTOMY    . TOTAL KNEE ARTHROPLASTY Left 12/16/2012   Procedure: LEFT TOTAL KNEE ARTHROPLASTY;  Surgeon: Gearlean Alf, MD;  Location: [facility];  Service: Orthopedics;  Laterality: Left;  . TOTAL KNEE ARTHROPLASTY Right 12/06/2015   Procedure: TOTAL KNEE ARTHROPLASTY;  Surgeon: Vickey Huger,  MD;  Location: [facility];  Service: Orthopedics;  Laterality: Right;    Allergies  Allergen Reactions  . No Known Allergies     Social History  Substance Use Topics  . Smoking status: Never Smoker  . Smokeless tobacco: Current User    Types: Chew     Comment: quit smoking cigars in 1985  . Alcohol use 1.0 oz/week    2 Standard drinks or equivalent per week     Comment: occasionally    Family History  Problem Relation Age of Onset  . Asthma Mother   . Heart attack Father   . Heart attack Sister   . Coronary artery disease      fhx  . Colon cancer Paternal Aunt    Prior to Admission medications   Medication Sig Start Date End Date Taking? Authorizing Provider  ASPERCREME LIDOCAINE EX Apply 1 application topically as needed (for pain).   Yes Historical Provider, MD  aspirin EC 81 MG tablet Take 81 mg by mouth daily.   Yes Historical Provider, MD  atorvastatin (LIPITOR) 40 MG tablet TAKE 1 TABLET BY MOUTH DAILY 11/27/14  Yes Marletta Lor, MD  cetirizine (ZYRTEC) 10 MG tablet Take 10 mg by mouth daily as needed for allergies.   Yes Historical Provider,  MD  Cholecalciferol (VITAMIN D3) 2000 units TABS Take 2,000 Units by mouth daily.   Yes Historical Provider, MD  levothyroxine (SYNTHROID, LEVOTHROID) 88 MCG tablet TAKE 1 TABLET BY MOUTH DAILY 11/04/15  Yes Marletta Lor, MD  lisinopril-hydrochlorothiazide (PRINZIDE,ZESTORETIC) 20-12.5 MG per tablet TAKE 1 TABLET BY MOUTH DAILY 11/27/14  Yes Marletta Lor, MD  Multiple Vitamin (MULTIVITAMIN) tablet Take 1 tablet by mouth daily.   Yes Historical Provider, MD  omeprazole (PRILOSEC) 20 MG capsule Take 20 mg by mouth daily.   Yes Historical Provider, MD  pantoprazole (PROTONIX) 40 MG tablet Take 1 tablet (40 mg total) by mouth daily. 05/25/16  Yes Marletta Lor, MD  PARoxetine (PAXIL) 20 MG tablet TAKE 1 TABLET BY MOUTH DAILY IN THE EVENING 06/25/15  Yes Marletta Lor, MD  promethazine (PHENERGAN) 25 MG tablet Take 1  tablet (25 mg total) by mouth every 6 (six) hours as needed for nausea or vomiting. 01/03/16  Yes Deno Etienne, DO  traMADol (ULTRAM) 50 MG tablet Take 1 tablet (50 mg total) by mouth every 6 (six) hours as needed. Patient taking differently: Take 100 mg by mouth every 6 (six) hours as needed for moderate pain.  02/10/16  Yes Marletta Lor, MD  amoxicillin-clavulanate (AUGMENTIN) 875-125 MG tablet Take 1 tablet by mouth 2 (two) times daily. One po bid x 7 days Patient not taking: Reported on 01/06/2016 01/02/16   Sherwood Gambler, MD  aspirin EC 325 MG EC tablet Take 1 tablet (325 mg total) by mouth 2 (two) times daily. Patient not taking: Reported on 06/27/2016 12/07/15   Donia Ast, PA  methocarbamol (ROBAXIN) 500 MG tablet Take 1-2 tablets (500-1,000 mg total) by mouth every 6 (six) hours as needed for muscle spasms. Patient not taking: Reported on 06/27/2016 12/07/15   Donia Ast, PA  oxyCODONE (OXY IR/ROXICODONE) 5 MG immediate release tablet Take 1-2 tablets (5-10 mg total) by mouth every 3 (three) hours as needed for breakthrough pain. Patient not taking: Reported on 06/27/2016 12/07/15   Donia Ast, PA  tadalafil (CIALIS) 20 MG tablet Take 1 tablet (20 mg total) by mouth daily as needed for erectile dysfunction. 04/09/15   Marletta Lor, MD     Review of Systems  Positive ROS: As above  All other systems have been reviewed and were otherwise negative with the exception of those mentioned in the HPI and as above.  Objective: Vital signs in last 24 hours: Temp:  [98.5 F (36.9 C)-99.4 F (37.4 C)] 98.5 F (36.9 C) (04/25 1013) Pulse Rate:  [80-94] 80 (04/25 1013) Resp:  [20] 20 (04/25 1013) BP: (115-138)/(90-94) 138/90 (04/25 1013) SpO2:  [96 %] 96 % (04/25 1013) Weight:  [109.8 kg (242 lb)-110 kg (242 lb 9.6 oz)] 109.8 kg (242 lb) (04/25 1013)  General Appearance: Alert Head: Normocephalic, without obvious abnormality, atraumatic Eyes: PERRL,  conjunctiva/corneas clear, EOM's intact,    Ears: Normal  Throat: Normal  Neck: Supple, Back: unremarkable, the patient's incision is well-healed. Lungs: Clear to auscultation bilaterally, respirations unlabored Heart: Regular rate and rhythm, no murmur, rub or gallop Abdomen: Soft, non-tender Extremities: Extremities normal, atraumatic, no cyanosis or edema Skin: unremarkable  NEUROLOGIC:   Mental status: alert and oriented,Motor Exam - grossly normal Sensory Exam - grossly normal Reflexes:  Coordination - grossly normal Gait - grossly normal Balance - grossly normal Cranial Nerves: I: smell Not tested  II: visual acuity  OS: Normal  OD: Normal   II: visual  fields Full to confrontation  II: pupils Equal, round, reactive to light  III,VII: ptosis None  III,IV,VI: extraocular muscles  Full ROM  V: mastication Normal  V: facial light touch sensation  Normal  V,VII: corneal reflex  Present  VII: facial muscle function - upper  Normal  VII: facial muscle function - lower Normal  VIII: hearing Not tested  IX: soft palate elevation  Normal  IX,X: gag reflex Present  XI: trapezius strength  5/5  XI: sternocleidomastoid strength 5/5  XI: neck flexion strength  5/5  XII: tongue strength  Normal    Data Review Lab Results  Component Value Date   WBC 9.3 06/27/2016   HGB 15.7 06/27/2016   HCT 45.8 06/27/2016   MCV 91.8 06/27/2016   PLT 267 06/27/2016   Lab Results  Component Value Date   NA 137 06/27/2016   K 3.8 06/27/2016   CL 103 06/27/2016   CO2 25 06/27/2016   BUN 13 06/27/2016   CREATININE 0.89 06/27/2016   GLUCOSE 107 (H) 06/27/2016   Lab Results  Component Value Date   INR 0.90 12/10/2012    Assessment/Plan: L2-3 disc degeneration, spinal stenosis, lumbar radiculopathy, lumbago, neurogenic claudication: I have discussed the situation with the patient and reviewed his imaging studies with him. We have discussed the various treatment options including  surgery. I have described the surgical treatment option open exploration of lumbar fusion with an L2-3 decompression, instrumentation, and fusion. I have described the surgery to him. I have shown him surgical models. We have discussed the risks, benefits, alternatives, expected postoperative course, and likelihood of achieving our goals with surgery. I have answered all the patient's questions. He has decided to proceed with surgery.   Madison Direnzo D 06/28/2016 11:53 AM

## 2016-06-29 LAB — CBC
HCT: 40.4 % (ref 39.0–52.0)
Hemoglobin: 13.6 g/dL (ref 13.0–17.0)
MCH: 31.3 pg (ref 26.0–34.0)
MCHC: 33.7 g/dL (ref 30.0–36.0)
MCV: 93.1 fL (ref 78.0–100.0)
PLATELETS: 267 10*3/uL (ref 150–400)
RBC: 4.34 MIL/uL (ref 4.22–5.81)
RDW: 14.5 % (ref 11.5–15.5)
WBC: 17.8 10*3/uL — AB (ref 4.0–10.5)

## 2016-06-29 LAB — BASIC METABOLIC PANEL
ANION GAP: 10 (ref 5–15)
BUN: 13 mg/dL (ref 6–20)
CALCIUM: 9.6 mg/dL (ref 8.9–10.3)
CO2: 27 mmol/L (ref 22–32)
CREATININE: 1.11 mg/dL (ref 0.61–1.24)
Chloride: 100 mmol/L — ABNORMAL LOW (ref 101–111)
GLUCOSE: 156 mg/dL — AB (ref 65–99)
Potassium: 4.1 mmol/L (ref 3.5–5.1)
Sodium: 137 mmol/L (ref 135–145)

## 2016-06-29 MED FILL — Heparin Sodium (Porcine) Inj 1000 Unit/ML: INTRAMUSCULAR | Qty: 60 | Status: AC

## 2016-06-29 MED FILL — Sodium Chloride IV Soln 0.9%: INTRAVENOUS | Qty: 1000 | Status: AC

## 2016-06-29 NOTE — Anesthesia Postprocedure Evaluation (Addendum)
Anesthesia Post Note  Patient: MICHAELANGELO MITTELMAN  Procedure(s) Performed: Procedure(s) (LRB): POSTERIOR LUMBAR INTERBODY FUSION, INTERBODY PROSTHESIS,POSTERIOR LATERAL ARTHRODESIS,POSTERIOR SGEMENTAL INSTRUMENTATION LUMBAR TWO- LUMBAR THREE (N/A)  Patient location during evaluation: PACU Anesthesia Type: General Level of consciousness: awake Pain management: pain level controlled Vital Signs Assessment: post-procedure vital signs reviewed and stable Respiratory status: spontaneous breathing Cardiovascular status: stable Postop Assessment: no signs of nausea or vomiting Anesthetic complications: no        Last Vitals:  Vitals:   06/29/16 1200 06/29/16 1649  BP: 121/74 101/73  Pulse: 62 68  Resp: 18 18  Temp: 36.5 C 36.9 C    Last Pain:  Vitals:   06/29/16 1250  TempSrc:   PainSc: 7    Pain Goal: Patients Stated Pain Goal: 3 (06/29/16 1250)               Dilyn Osoria JR,JOHN Mateo Flow

## 2016-06-29 NOTE — Progress Notes (Signed)
Patient ID: [name], male   DOB: 1950/06/20, 66 y.o.   MRN: [id number] Subjective:  The patient is alert and pleasant. His back is appropriately sore. He looks well.  Objective: Vital signs in last 24 hours: Temp:  [97.6 F (36.4 C)-98.9 F (37.2 C)] 97.7 F (36.5 C) (04/26 1200) Pulse Rate:  [62-100] 62 (04/26 1200) Resp:  [13-22] 18 (04/26 1200) BP: (121-143)/(69-96) 121/74 (04/26 1200) SpO2:  [86 %-98 %] 98 % (04/26 1200)  Intake/Output from previous day: 04/25 0701 - 04/26 0700 In: 2307.3 [I.V.:2207.3; IV Piggyback:100] Out: 2565 [Urine:2455; Blood:110] Intake/Output this shift: No intake/output data recorded.  Physical exam the patient is alert and pleasant. He is moving his lower extremities well.  Lab Results:  Recent Labs  06/27/16 1455 06/29/16 0639  WBC 9.3 17.8*  HGB 15.7 13.6  HCT 45.8 40.4  PLT 267 267   BMET  Recent Labs  06/27/16 1455 06/29/16 0639  NA 137 137  K 3.8 4.1  CL 103 100*  CO2 25 27  GLUCOSE 107* 156*  BUN 13 13  CREATININE 0.89 1.11  CALCIUM 9.7 9.6    Studies/Results: Dg Lumbar Spine 2-3 Views  Result Date: 06/28/2016 CLINICAL DATA:  L2-3 PLIF. EXAM: DG C-ARM 61-120 MIN; LUMBAR SPINE - 2-3 VIEW COMPARISON:  04/07/2016 FINDINGS: Multiple C-arm images show extension of the discectomy infusion through the L2-3 level. Interbody device appears well positioned. Pedicle screws appear well positioned. Rides not yet placed. IMPRESSION: Extension of PLIF through L2-3 in progress. Electronically Signed   By: Nelson Chimes M.D.   On: 06/28/2016 16:13   Dg C-arm 1-60 Min  Result Date: 06/28/2016 CLINICAL DATA:  L2-3 PLIF. EXAM: DG C-ARM 61-120 MIN; LUMBAR SPINE - 2-3 VIEW COMPARISON:  04/07/2016 FINDINGS: Multiple C-arm images show extension of the discectomy infusion through the L2-3 level. Interbody device appears well positioned. Pedicle screws appear well positioned. Rides not yet placed. IMPRESSION: Extension of PLIF through L2-3  in progress. Electronically Signed   By: Nelson Chimes M.D.   On: 06/28/2016 16:13    Assessment/Plan: Postoperative day #1: The patient is doing well. He may go home tomorrow.  LOS: 1 day     Ridley Dileo D 06/29/2016, 4:42 PM

## 2016-06-29 NOTE — Evaluation (Signed)
Physical Therapy Evaluation Patient Details Name: [name] MRN: [id number] DOB: 01-10-1951 Today's Date: 06/29/2016   History of Present Illness  Pt is a 66 y/o male who presents s/p L2-L3 PLIF on 06/28/16. PMH significant for L3-L5 fusion ~5 years ago, B TKA's, TIA, CA.  Clinical Impression  Pt admitted with above diagnosis. Pt currently with functional limitations due to the deficits listed below (see PT Problem List). At the time of PT eval pt was able to perform transfers and ambulation with gross min guard to min assist for balance support and safety. Pt required several standing rest breaks against the wall for support due to pain and fatigue. He appeared to have difficulty with the R knee and required balance support as he "shook" the knee out. Will require stair training and ADL training next session. Pt will benefit from skilled PT to increase their independence and safety with mobility to allow discharge to the venue listed below.       Follow Up Recommendations Outpatient PT;Supervision for mobility/OOB (When appropriate per post-op protocol)    Equipment Recommendations  3in1 (PT)    Recommendations for Other Services       Precautions / Restrictions Precautions Precautions: Fall;Back Precaution Booklet Issued: Yes (comment) Restrictions Weight Bearing Restrictions: No      Mobility  Bed Mobility Overal bed mobility: Needs Assistance Bed Mobility: Rolling;Sit to Sidelying Rolling: Supervision       Sit to sidelying: Supervision General bed mobility comments: VC's for proper log roll technique. Pt completed very quickly in an attempt to avoid pain, however technique suffered. Was not interested in practicing again.   Transfers Overall transfer level: Needs assistance Equipment used: None Transfers: Sit to/from Stand Sit to Stand: Min guard         General transfer comment: Hands-on guarding required for safety. Pt appeared slightly unsteady upon  initial stand and required cues for improved posture.   Ambulation/Gait Ambulation/Gait assistance: Min assist Ambulation Distance (Feet): 200 Feet Assistive device: None Gait Pattern/deviations: Step-through pattern;Decreased stride length;Staggering left;Staggering right;Trunk flexed Gait velocity: Decreased Gait velocity interpretation: Below normal speed for age/gender General Gait Details: Pt with difficulty ambulating due to RLE weakness. R knee appears to somewhat "lock up" on him, causing him to have to stop and "shake" the knee out before he can begin to walk again. When asked if he feels like it is locking up, however, pt says no. Several standing rest breaks were required due to pain and fatigue.   Stairs            Wheelchair Mobility    Modified Rankin (Stroke Patients Only)       Balance Overall balance assessment: Needs assistance Sitting-balance support: Feet supported;No upper extremity supported Sitting balance-Leahy Scale: Fair     Standing balance support: No upper extremity supported;During functional activity Standing balance-Leahy Scale: Poor Standing balance comment: Pt appears unsteady with dynamic standing balance and requires assist.                              Pertinent Vitals/Pain Pain Assessment: Faces Faces Pain Scale: Hurts even more Pain Location: Incision site Pain Descriptors / Indicators: Operative site guarding;Discomfort Pain Intervention(s): Limited activity within patient's tolerance;Monitored during session;Repositioned    Home Living Family/patient expects to be discharged to:: Private residence Living Arrangements: Spouse/significant other Available Help at Discharge: Family;Available PRN/intermittently Type of Home: Apartment Home Access: Stairs to enter Entrance Stairs-Rails: Left Entrance  Stairs-Number of Steps: 1 flight Home Layout: One level Home Equipment: Cane - single point;Walker - 2 wheels       Prior Function Level of Independence: Independent               Hand Dominance   Dominant Hand: Right    Extremity/Trunk Assessment   Upper Extremity Assessment Upper Extremity Assessment: Defer to OT evaluation    Lower Extremity Assessment Lower Extremity Assessment: Generalized weakness RLE Deficits / Details: Pt with prior R TKA ~6 months ago. Appeared to either be "locking up" or causing discomfort. As he walked, he occasionally stopped and "shook" his R knee before he could continue walking.     Cervical / Trunk Assessment Cervical / Trunk Assessment: Other exceptions Cervical / Trunk Exceptions: s/p surgery  Communication   Communication: No difficulties  Cognition Arousal/Alertness: Awake/alert Behavior During Therapy: WFL for tasks assessed/performed Overall Cognitive Status: Within Functional Limits for tasks assessed                                        General Comments      Exercises     Assessment/Plan    PT Assessment Patient needs continued PT services  PT Problem List Decreased strength;Decreased range of motion;Decreased activity tolerance;Decreased balance;Decreased mobility;Decreased knowledge of use of DME;Decreased safety awareness;Decreased knowledge of precautions;Pain       PT Treatment Interventions DME instruction;Gait training;Stair training;Functional mobility training;Therapeutic activities;Therapeutic exercise;Neuromuscular re-education;Patient/family education    PT Goals (Current goals can be found in the Care Plan section)  Acute Rehab PT Goals Patient Stated Goal: Home in 2-3 days PT Goal Formulation: With patient Time For Goal Achievement: 07/06/16 Potential to Achieve Goals: Good    Frequency Min 5X/week   Barriers to discharge        Co-evaluation               End of Session Equipment Utilized During Treatment: Gait belt;Back brace Activity Tolerance: Patient tolerated treatment  well Patient left: in bed;with call bell/phone within reach Nurse Communication: Mobility status PT Visit Diagnosis: Muscle weakness (generalized) (M62.81);Unsteadiness on feet (R26.81);Pain Pain - part of body:  (Back)    Time: 5465-0354 PT Time Calculation (min) (ACUTE ONLY): 19 min   Charges:   PT Evaluation $PT Eval Moderate Complexity: 1 Procedure     PT G Codes:        Rolinda Roan, PT, DPT Acute Rehabilitation Services Pager: [phone]   Thelma Comp 06/29/2016, 1:09 PM

## 2016-06-30 MED ORDER — DOCUSATE SODIUM 100 MG PO CAPS: 100.0000 mg | ORAL_CAPSULE | Freq: Two times a day (BID) | ORAL | 0 refills | Status: AC

## 2016-06-30 MED ORDER — CYCLOBENZAPRINE HCL 10 MG PO TABS: 10.0000 mg | ORAL_TABLET | Freq: Three times a day (TID) | ORAL | 1 refills | Status: AC | PRN

## 2016-06-30 MED ORDER — OXYCODONE-ACETAMINOPHEN 10-325 MG PO TABS: 1.0000 | ORAL_TABLET | ORAL | 0 refills | Status: AC | PRN

## 2016-06-30 NOTE — Progress Notes (Signed)
Physical Therapy Treatment Patient Details Name: [name] MRN: [id number] DOB: Apr 29, 1950 Today's Date: 06/30/2016    History of Present Illness Pt is a 66 y/o male who presents s/p L2-L3 PLIF on 06/28/16. PMH significant for L3-L5 fusion ~5 years ago, B TKA's, TIA, CA.    PT Comments    Pt progressing toward PT goals this session.  He required supervision for safety with ambulation and completed stair training requiring supervision.  Back precautions and cane use reviewed with pt. Current recommendations remain appropriate, as he will benefit from continued skilled therapy to maximize return to PLOF.  Will follow acutely.    Follow Up Recommendations  Outpatient PT;Supervision for mobility/OOB     Equipment Recommendations  3in1 (PT) (received in room)    Recommendations for Other Services       Precautions / Restrictions Precautions Precautions: Fall;Back Precaution Comments: Pt able to recall 2/3 precautions, leaving out L and adding A for arch Required Braces or Orthoses: Spinal Brace Spinal Brace: Lumbar corset;Applied in sitting position (reeducated on brace wearing and application) Restrictions Weight Bearing Restrictions: No    Mobility  Bed Mobility               General bed mobility comments: not asssessed this session but log roll technique discussed with pt  Transfers Overall transfer level: Needs assistance Equipment used: None Transfers: Sit to/from Stand Sit to Stand: Supervision         General transfer comment: Supervision for safety.  Pt grabbing for wall/furniture [place] standing.  Ambulation/Gait Ambulation/Gait assistance: Supervision Ambulation Distance (Feet): 200 Feet Assistive device: None Gait Pattern/deviations: Step-through pattern;Decreased stride length;Wide base of support Gait velocity: Decreased Gait velocity interpretation: Below normal speed for age/gender General Gait Details: Pt appeared unsteady at times and  frequently used wall or handrail for steadying.  Pt educated to use cane when feeling unsteady.  Pt stated he used handrail because of his R knee pain.  Pt stated his R hip was bothering him during ambulation.  Pt showed slight LOB ~2 times but able to independently correct.  Two <30 second standing rest breaks required.   Stairs Stairs: Yes   Stair Management: One rail Left;Step to pattern Number of Stairs: 11 General stair comments: Supervision for safety.  Cues required for step to sequence (up with the good, down with the bad).  Pt appeared overall steady with stair training but reliant on handrail for support.  Wheelchair Mobility    Modified Rankin (Stroke Patients Only)       Balance Overall balance assessment: Needs assistance Sitting-balance support: Feet supported;No upper extremity supported Sitting balance-Leahy Scale: Fair     Standing balance support: No upper extremity supported Standing balance-Leahy Scale: Fair                              Cognition Arousal/Alertness: Awake/alert Behavior During Therapy: WFL for tasks assessed/performed Overall Cognitive Status: Within Functional Limits for tasks assessed                                        Exercises      General Comments        Pertinent Vitals/Pain Pain Assessment: Faces Faces Pain Scale: Hurts little more Pain Location: Incision site Pain Descriptors / Indicators: Operative site guarding;Discomfort Pain Intervention(s): Monitored during session;Repositioned  Home Living                      Prior Function            PT Goals (current goals can now be found in the care plan section) Acute Rehab PT Goals Patient Stated Goal: Home PT Goal Formulation: With patient Time For Goal Achievement: 07/06/16 Potential to Achieve Goals: Good Progress towards PT goals: Progressing toward goals    Frequency    Min 5X/week      PT Plan       Co-evaluation             End of Session Equipment Utilized During Treatment: Gait belt;Back brace Activity Tolerance: Patient tolerated treatment well Patient left: in bed;with call bell/phone within reach Nurse Communication: Mobility status PT Visit Diagnosis: Unsteadiness on feet (R26.81);Pain Pain - part of body:  (back)     Time: 0340-3524 PT Time Calculation (min) (ACUTE ONLY): 23 min  Charges:  $Gait Training: 23-37 mins                    G Codes:       Gaetano Net SPT   Gaetano Net 06/30/2016, 11:44 AM

## 2016-06-30 NOTE — Discharge Summary (Signed)
Physician Discharge Summary  Patient ID: [name] MRN: [id number] DOB/AGE: Mar 22, 1950 66 y.o.  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 The surgery went well.  The patient's postoperative course was unremarkable. On postoperative day #2 the patient requested discharge to home. He was given written and oral discharge instructions. All his questions were answered.  Consults: Physical therapy Significant Diagnostic Studies: None Treatments: Exploration of lumbar fusion, L2-3 decompression, instrumentation, and fusion. Discharge Exam: Blood pressure 107/72, pulse 77, temperature 98.7 F (37.1 C), temperature source Oral, resp. rate 18, weight 109.8 kg (242 lb), SpO2 94 %. The patient is alert and pleasant. He looks well. His strength is grossly normal in his lower extremities.  Disposition: Home  Discharge Instructions    Call MD for:  difficulty breathing, headache or visual disturbances    Complete by:  As directed    Call MD for:  extreme fatigue    Complete by:  As directed    Call MD for:  hives    Complete by:  As directed    Call MD for:  persistant dizziness or light-headedness    Complete by:  As directed    Call MD for:  persistant nausea and vomiting    Complete by:  As directed    Call MD for:  redness, tenderness, or signs of infection (pain, swelling, redness, odor or green/yellow discharge around incision site)    Complete by:  As directed    Call MD for:  severe uncontrolled pain    Complete by:  As directed    Call MD for:  temperature >100.4    Complete by:  As  directed    Diet - low sodium heart healthy    Complete by:  As directed    Discharge instructions    Complete by:  As directed    Call [phone] for a followup appointment. Take a stool softener while you are using pain medications.   Driving Restrictions    Complete by:  As directed    Do not drive for 2 weeks.   Increase activity slowly    Complete by:  As directed    Lifting restrictions    Complete by:  As directed    Do not lift more than 5 pounds. No excessive bending or twisting.   May shower / Bathe    Complete by:  As directed    He may shower after the pain she is removed 3 days after surgery. Leave the incision alone.   Remove dressing in 24 hours    Complete by:  As directed         Signed: Mitesh Rosendahl D 06/30/2016, 7:47 AM

## 2016-06-30 NOTE — Progress Notes (Signed)
Patient alert and oriented, mae's well, voiding adequate amount of urine, swallowing without difficulty, c/o mild pain at time of discharge. Patient discharged home with family. Script and discharged instructions given to patient. Patient and family stated understanding of instructions given. Patient has an appointment with Dr. Jenkins 

## 2016-07-11 ENCOUNTER — Ambulatory Visit: Payer: Medicare Other | Admitting: Family Medicine

## 2016-08-04 DIAGNOSIS — 419620001 Death: Secondary | SNOMED CT | POA: Diagnosis not present

## 2016-08-04 DEATH — deceased

## 2016-08-10 ENCOUNTER — Telehealth: Payer: Self-pay | Admitting: Internal Medicine

## 2016-08-10 NOTE — Telephone Encounter (Signed)
° ° °  Pt brother Mortimer Fries call to say pt passed away a 2 weeks ago

## 2016-08-10 NOTE — Telephone Encounter (Signed)
Please note

## 2016-08-11 NOTE — Addendum Note (Signed)
Addendum  created 08/11/16 1204 by Jermar Colter, MD   Sign clinical note    

## 2016-09-25 ENCOUNTER — Other Ambulatory Visit: Payer: Self-pay

## 2018-02-27 IMAGING — CR DG CHEST 2V
2 series · 2 of 2 positions shown · non-contrast
Comparison: PA and lateral chest 12/10/2012.  CT chest 05/30/2010.

CLINICAL DATA: Preoperative examination. Patient for total knee
replacement.

EXAM:
CHEST  2 VIEW

[w chest pa]
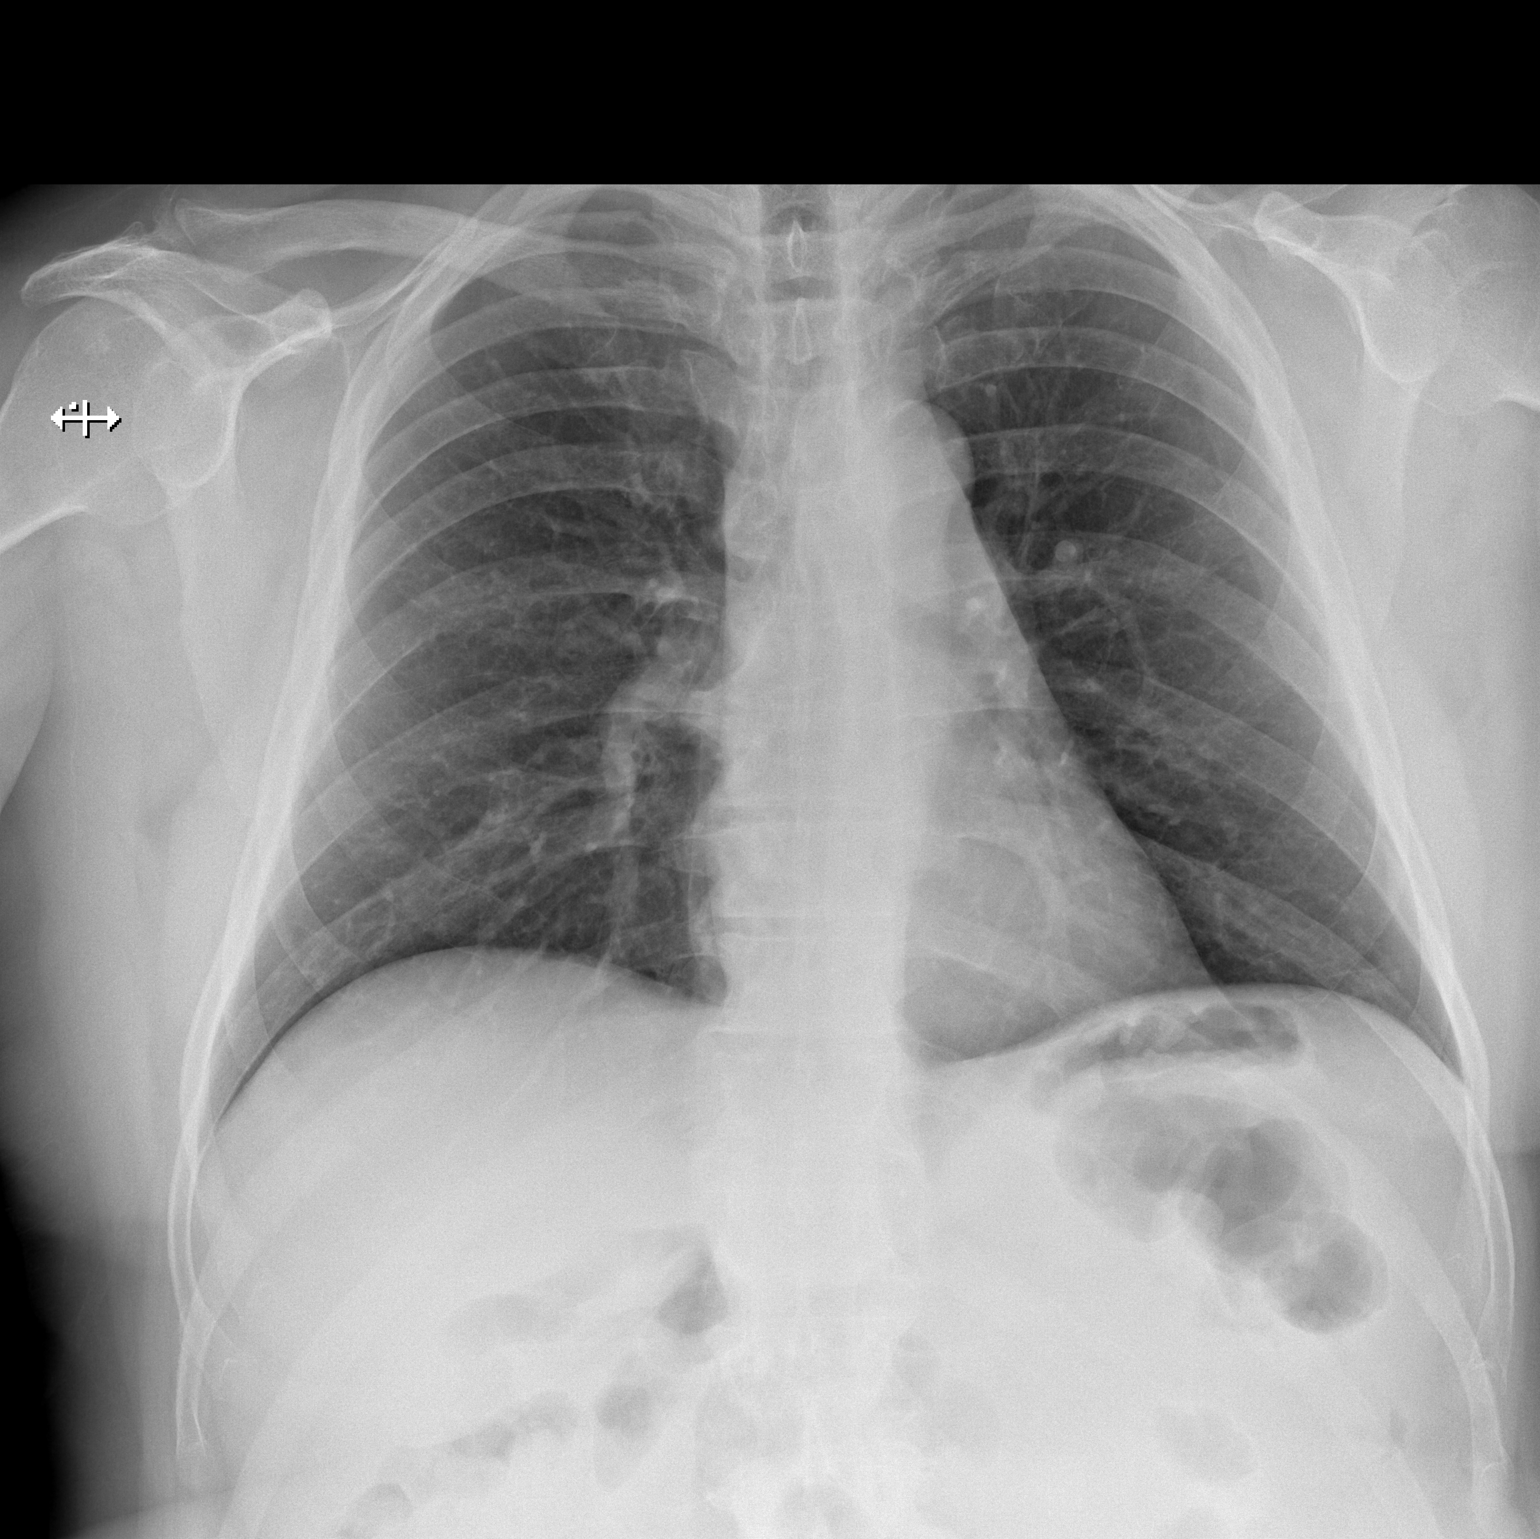

[w chest lat]
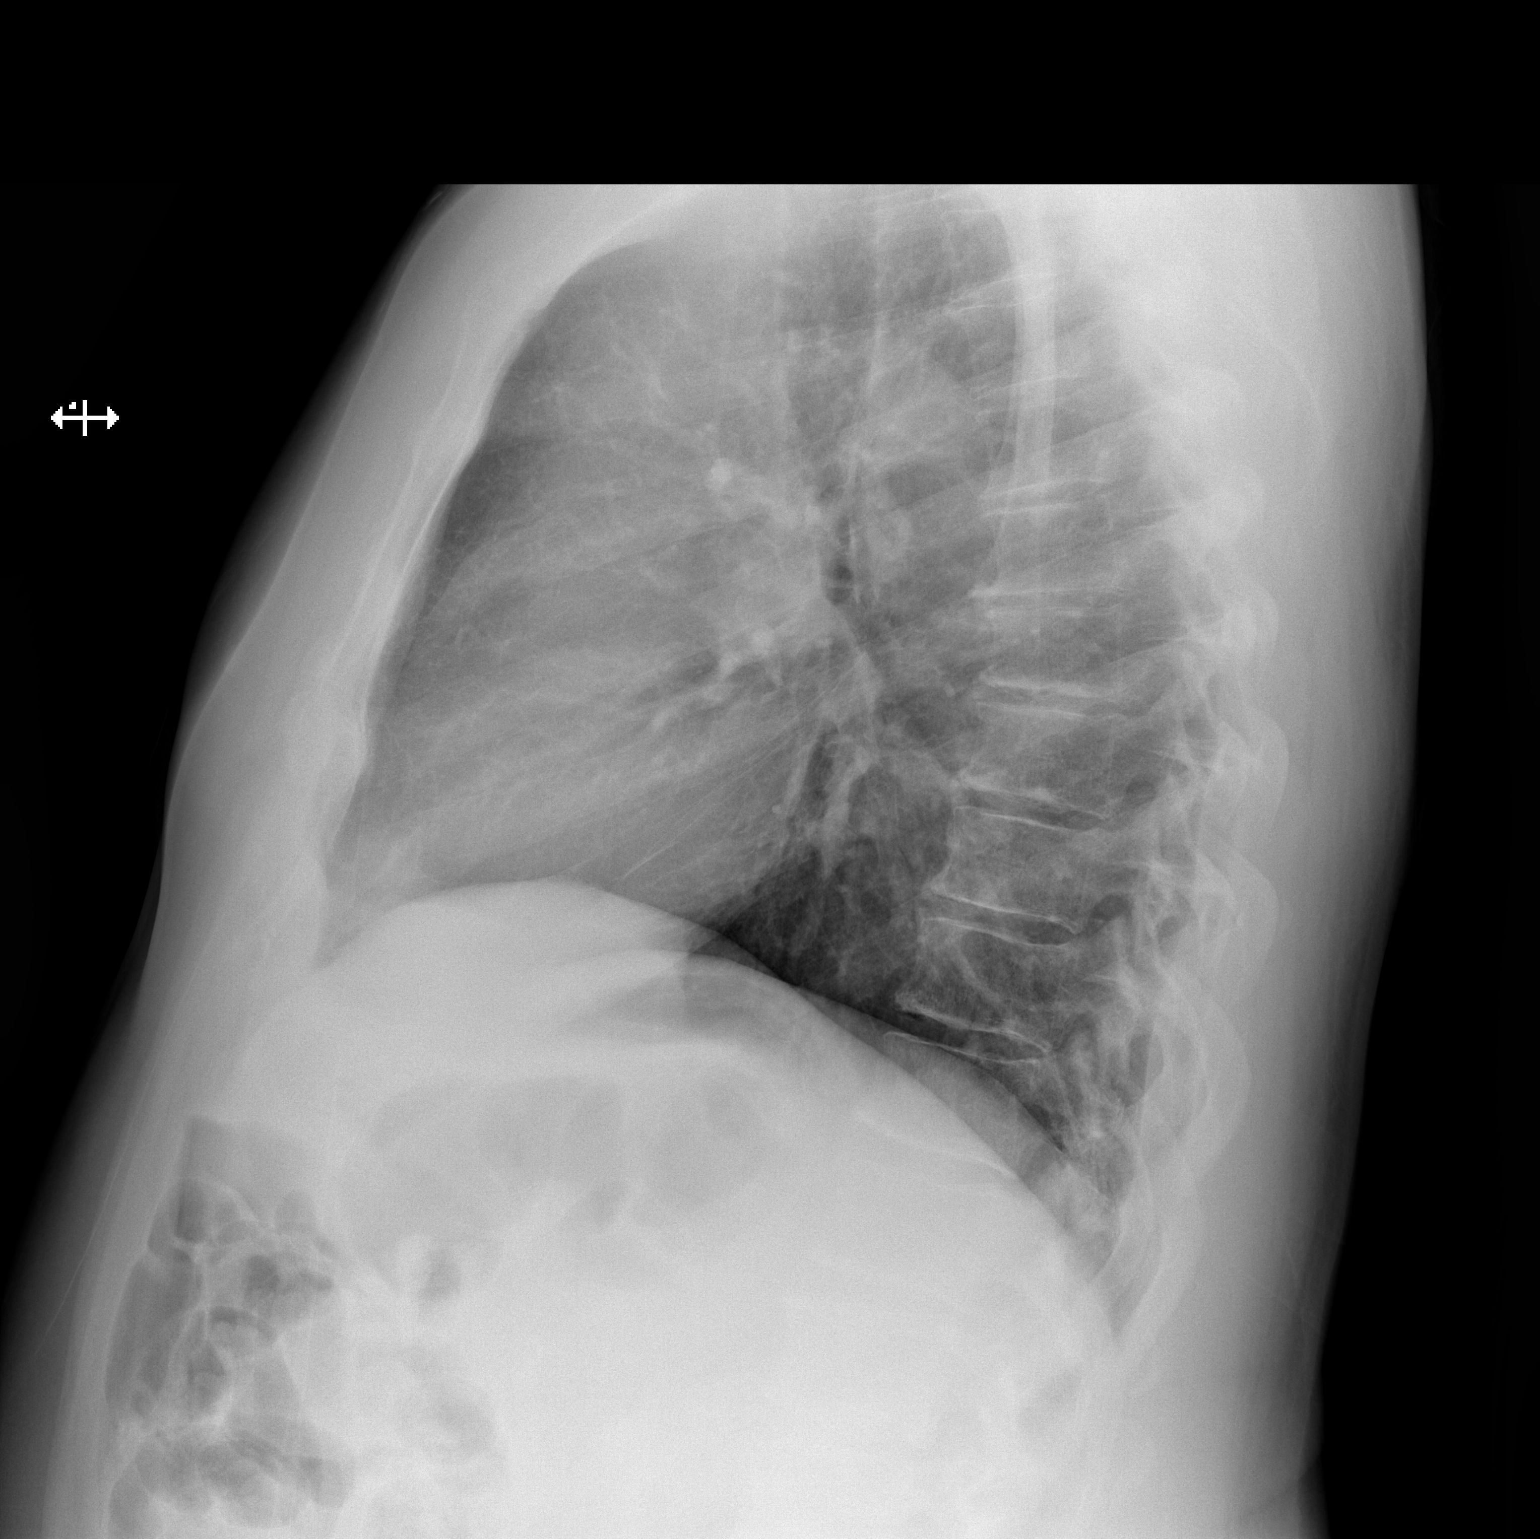

[2 of 2 positions shown; findings below may reference images not displayed]

FINDINGS: The lungs are clear. Heart size is normal. No pneumothorax or
pleural effusion. No focal bony abnormality.
IMPRESSION: No acute disease.
